# Patient Record
Sex: Female | Born: 1975
Health system: Southern US, Community
[De-identification: ages and names within clinical notes are randomized; demographics above are authoritative.]

## PROBLEM LIST (undated history)

## (undated) ENCOUNTER — Inpatient Hospital Stay (HOSPITAL_COMMUNITY): Payer: Self-pay

## (undated) DIAGNOSIS — N920 Excessive and frequent menstruation with regular cycle: Secondary | ICD-10-CM

## (undated) DIAGNOSIS — F419 Anxiety disorder, unspecified: Secondary | ICD-10-CM

## (undated) DIAGNOSIS — B9689 Other specified bacterial agents as the cause of diseases classified elsewhere: Secondary | ICD-10-CM

## (undated) DIAGNOSIS — R51 Headache: Secondary | ICD-10-CM

## (undated) DIAGNOSIS — F32A Depression, unspecified: Secondary | ICD-10-CM

## (undated) DIAGNOSIS — M549 Dorsalgia, unspecified: Secondary | ICD-10-CM

## (undated) DIAGNOSIS — O24419 Gestational diabetes mellitus in pregnancy, unspecified control: Secondary | ICD-10-CM

## (undated) DIAGNOSIS — R7303 Prediabetes: Secondary | ICD-10-CM

## (undated) DIAGNOSIS — I10 Essential (primary) hypertension: Secondary | ICD-10-CM

## (undated) DIAGNOSIS — M1990 Unspecified osteoarthritis, unspecified site: Secondary | ICD-10-CM

## (undated) DIAGNOSIS — E282 Polycystic ovarian syndrome: Secondary | ICD-10-CM

## (undated) DIAGNOSIS — D649 Anemia, unspecified: Secondary | ICD-10-CM

## (undated) DIAGNOSIS — L0292 Furuncle, unspecified: Secondary | ICD-10-CM

## (undated) DIAGNOSIS — R739 Hyperglycemia, unspecified: Secondary | ICD-10-CM

## (undated) DIAGNOSIS — J189 Pneumonia, unspecified organism: Secondary | ICD-10-CM

## (undated) DIAGNOSIS — T7840XA Allergy, unspecified, initial encounter: Secondary | ICD-10-CM

## (undated) DIAGNOSIS — J208 Acute bronchitis due to other specified organisms: Secondary | ICD-10-CM

## (undated) DIAGNOSIS — E781 Pure hyperglyceridemia: Secondary | ICD-10-CM

## (undated) DIAGNOSIS — F329 Major depressive disorder, single episode, unspecified: Secondary | ICD-10-CM

## (undated) DIAGNOSIS — Z8489 Family history of other specified conditions: Secondary | ICD-10-CM

## (undated) HISTORY — DX: Anemia, unspecified: D64.9

## (undated) HISTORY — DX: Dorsalgia, unspecified: M54.9

## (undated) HISTORY — DX: Prediabetes: R73.03

## (undated) HISTORY — DX: Pure hyperglyceridemia: E78.1

## (undated) HISTORY — DX: Allergy, unspecified, initial encounter: T78.40XA

## (undated) HISTORY — DX: Furuncle, unspecified: L02.92

## (undated) HISTORY — DX: Unspecified osteoarthritis, unspecified site: M19.90

## (undated) HISTORY — DX: Hyperglycemia, unspecified: R73.9

## (undated) HISTORY — DX: Polycystic ovarian syndrome: E28.2

---

## 1998-04-03 ENCOUNTER — Ambulatory Visit (HOSPITAL_COMMUNITY): Admission: RE | Admit: 1998-04-03 | Discharge: 1998-04-03 | Payer: Self-pay | Admitting: *Deleted

## 1998-07-23 ENCOUNTER — Encounter: Admission: RE | Admit: 1998-07-23 | Discharge: 1998-10-21 | Payer: Self-pay | Admitting: Gynecology

## 1998-08-28 ENCOUNTER — Inpatient Hospital Stay (HOSPITAL_COMMUNITY): Admission: AD | Admit: 1998-08-28 | Discharge: 1998-08-28 | Payer: Self-pay | Admitting: *Deleted

## 1998-08-29 ENCOUNTER — Inpatient Hospital Stay (HOSPITAL_COMMUNITY): Admission: AD | Admit: 1998-08-29 | Discharge: 1998-08-31 | Payer: Self-pay | Admitting: Gynecology

## 1998-10-11 ENCOUNTER — Other Ambulatory Visit: Admission: RE | Admit: 1998-10-11 | Discharge: 1998-10-11 | Payer: Self-pay | Admitting: Obstetrics and Gynecology

## 2000-08-06 ENCOUNTER — Other Ambulatory Visit: Admission: RE | Admit: 2000-08-06 | Discharge: 2000-08-06 | Payer: Self-pay | Admitting: *Deleted

## 2001-03-25 ENCOUNTER — Emergency Department (HOSPITAL_COMMUNITY): Admission: EM | Admit: 2001-03-25 | Discharge: 2001-03-25 | Payer: Self-pay | Admitting: Emergency Medicine

## 2002-08-31 HISTORY — PX: MOUTH SURGERY: SHX715

## 2005-03-07 ENCOUNTER — Emergency Department (HOSPITAL_COMMUNITY): Admission: EM | Admit: 2005-03-07 | Discharge: 2005-03-08 | Payer: Self-pay | Admitting: Emergency Medicine

## 2005-08-20 ENCOUNTER — Ambulatory Visit: Payer: Self-pay | Admitting: Family Medicine

## 2005-12-23 ENCOUNTER — Other Ambulatory Visit: Admission: RE | Admit: 2005-12-23 | Discharge: 2005-12-23 | Payer: Self-pay | Admitting: Gynecology

## 2006-08-31 DIAGNOSIS — Z8489 Family history of other specified conditions: Secondary | ICD-10-CM

## 2006-08-31 HISTORY — DX: Family history of other specified conditions: Z84.89

## 2007-04-24 ENCOUNTER — Inpatient Hospital Stay (HOSPITAL_COMMUNITY): Admission: AD | Admit: 2007-04-24 | Discharge: 2007-04-24 | Payer: Self-pay | Admitting: Obstetrics and Gynecology

## 2007-04-26 ENCOUNTER — Encounter (INDEPENDENT_AMBULATORY_CARE_PROVIDER_SITE_OTHER): Payer: Self-pay | Admitting: Obstetrics and Gynecology

## 2007-04-26 ENCOUNTER — Inpatient Hospital Stay (HOSPITAL_COMMUNITY): Admission: AD | Admit: 2007-04-26 | Discharge: 2007-04-29 | Payer: Self-pay | Admitting: Obstetrics and Gynecology

## 2007-05-05 ENCOUNTER — Inpatient Hospital Stay (HOSPITAL_COMMUNITY): Admission: AD | Admit: 2007-05-05 | Discharge: 2007-05-05 | Payer: Self-pay | Admitting: Obstetrics and Gynecology

## 2007-05-07 ENCOUNTER — Inpatient Hospital Stay (HOSPITAL_COMMUNITY): Admission: AD | Admit: 2007-05-07 | Discharge: 2007-05-07 | Payer: Self-pay | Admitting: Obstetrics and Gynecology

## 2010-11-24 LAB — ANTIBODY SCREEN: Antibody Screen: NEGATIVE

## 2010-11-24 LAB — ABO/RH: RH Type: POSITIVE

## 2010-11-24 LAB — RPR: RPR: NONREACTIVE

## 2011-01-13 NOTE — Discharge Summary (Signed)
NAMECORALINE, TALWAR                 ACCOUNT NO.:  [id number]   MEDICAL RECORD NO.:  [id number]          PATIENT TYPE:  INP   LOCATION:  9104                          [facility]  [facility]   PHYSICIAN:  Osborn Coho, M.D.   DATE OF BIRTH:  1976-08-05   DATE OF ADMISSION:  04/26/2007  DATE OF DISCHARGE:  04/29/2007                               DISCHARGE SUMMARY   DISCHARGE PHYSICIAN:  Dois Davenport A. Rivard, M.D.   ADMISSION DIAGNOSES:  1. Intrauterine pregnancy at 40-2/7th's weeks.  2. Spontaneous rupture of membranes.  3. Early labor.   DISCHARGE DIAGNOSES:  1. Intrauterine pregnancy at 40-2/7th's weeks.  2. Spontaneous rupture of membranes.  3. Early labor.  4. Variable decelerations in labor and prolonged active stage of      labor.  5. Status post low-transverse cesarean section of a viable female      infant weighing 7 pounds 10 ounces, Apgar's 9 and 9.  6. Placental abruption.  7. Cord around the foot.   HOSPITAL PROCEDURES:  1. Electronic fetal monitoring.  2. Epidural anesthesia.  3. Internal fetal monitoring.  4. STAT low-transverse cesarean section.  5. General anesthesia.   HOSPITAL COURSE:  The patient was admitted in early labor with ruptured  membranes.  Cervix was 3-cm dilated with clear fluid leaking.  She  progressed slowly in labor and internal monitors were placed.  She  developed variable decelerations and late decelerations and then  developed a period of prolonged decelerations and uterine rupture was  suspected as the patient began to have vaginal bleeding along with the  decelerations and she was taken to the operating room for a STAT C-  section and was delivered of a female infant weighing 7 pounds 10 ounces  Apgar's 9 and 9.  We found a placental abruption and a cord around the  foot. The patient was taken to the recovery room to recover from general  anesthesia and then to the mother/baby unit where she did well.  On post-op day #1, Foley was removed.   She was tolerating food and  fluids.  She was breast feeding and given routine post-op care.  On post-op day #2, she was doing well but feeling shaky.  Blood sugar 30  minutes after eating was 176 and 2 hours after eating was 136.  Hemoglobin was 9.3 and chemistries were normal.  On post-op day #3, she was feeling much better.  Up and around and  breast feeding without problems.  Vital signs were stable.  Chest was  clear.  Heart rate regular rate and rhythm.  Abdomen soft and  appropriately tender.  Incision was clean, dry, and intact.  JP was  draining scant fluid and was removed.  Lochia was within normal limits.  Extremities within normal limits.  She was deemed to receive full  benefit of her hospital stay, was discharged home.   DISCHARGE LABORATORY:  White blood cell count 15.1, hemoglobin 9.3,  platelets 319.  Chemistries normal.   DISCHARGE MEDICATIONS:  1. Motrin 600 mg p.o. q.6 h. p.r.n.  2. Tylox 1-2 p.o. q.4 h.  p.r.n.   DISCHARGE INSTRUCTIONS:  Per [facility] handout.   DISCHARGE FOLLOWUP:  In 6 weeks or p.r.n.      Marie L. Williams, C.N.M.      Osborn Coho, M.D.  Electronically Signed    MLW/MEDQ  D:  04/29/2007  T:  04/29/2007  Job:  [id number]
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
## 2011-01-13 NOTE — Op Note (Signed)
Teresa Perez, KRAUSZ                 ACCOUNT NO.:  [id number]   MEDICAL RECORD NO.:  [id number]          PATIENT TYPE:  INP   LOCATION:  9104                          [facility]  [facility]   PHYSICIAN:  Crist Fat. Rivard, M.D. DATE OF BIRTH:  Jul 16, 1976   DATE OF PROCEDURE:  04/26/2007  DATE OF DISCHARGE:                               OPERATIVE REPORT   PREOPERATIVE DIAGNOSIS:  Intrauterine pregnancy at 40 weeks with  previous cesarean section, nonreassuring fetal heart rate, possible  uterine rupture.   POSTOPERATIVE DIAGNOSIS:  Intrauterine pregnancy at 40 weeks, previous  cesarean section, nonreassuring fetal heart rate, placental abruptio.   ANESTHESIA:  General, Dr. Tacy Dura.   PROCEDURE:  Repeat low transverse cesarean section.   SURGEON:  Crist Fat. Rivard, M.D.   ASSISTANT:  Wynelle Bourgeois, certified nurse midwife.   ESTIMATED BLOOD LOSS:  700 mL.   PROCEDURE:  The patient was rapidly consented for a repeat low  transverse cesarean section in light of nonreassuring fetal heart rate  and taken to OR number one.  Previous epidural anesthesia was  insufficient.  Decision was made to proceed with general anesthesia and  endotracheal intubation which was performed without complication after  the patient had been prepped and draped in a sterile fashion.  A Foley  catheter already being in her bladder.   We then proceed with a Pfannenstiel incision overlooking the previous  Pfannenstiel incision and this was brought down sharply to the fascia.  The fascia is incised in a low transverse fashion.  Linea alba is  dissected.  Peritoneum is entered bluntly.  Visceral peritoneum is  entered in a low transverse fashion allowing [place] to safely retract  bladder by developing a bladder flap.  The myometrium is then entered in  a low transverse fashion first with knife then extended bluntly.  Amniotic fluid was port wine tinted and clots were evacuated.  We then  assist the birth of a female infant  in vertex presentation at 12:29.  Mouth and nose were suctioned.  Body is delivered.  Cord is clamped with  two Kelly clamps and the baby is given to the pediatrician present in  the room.   The placenta is allowed to deliver spontaneously.  It is complete cord,  has three vessels and the placenta is sent for cord blood donation, then  to pathology for placental abruptio.  Uterine revision is negative.  Ancef 2 grams IV is given to the patient and we then proceed with  closure of the myometrium in two layers, first with a running lock  suture of 0 Vicryl, then with a Lembert suture of 0 Vicryl imbricating  the first one.  Hemostasis was completed on peritoneal edges with  cauterization.  Both paracolic gutters are cleaned.  Both tubes and  ovaries assessed and normal.  The pelvis is then profusely irrigated  with warm saline and hemostasis is deemed adequate.   Under fascia hemostasis is completed with cautery as well as with three  figure-of-eight stitches of 0 Vicryl on the right rectus muscle at the  junction with the fascia  where bleeding was evident.  After the sutures  hemostasis is adequate.   The fascia is closed with two running sutures of 1 Vicryl meeting  midline.  Wound is irrigated with warm saline.  Hemostasis is completed  with cautery with a left counter incision.  A #10 Jackson-Pratt drain is  inserted in the incision and held in placed with the 0 silk suture.  Skin is closed with a subcuticular suture of 3-0 Monocryl and Steri-  Strips.   Instrument and sponge count is complete x2.  Estimated blood loss is 700  mL.  The procedure is well tolerated by the patient who is taken to  recovery room in a well and stable condition.   Little girl was born at 12:29, received an Apgar of 9 at one minute, 9  at five minutes and weighs 7 pounds 10 ounces.   SPECIMEN:  Placenta is sent to pathology for placental abruptio.      Crist Fat Rivard, M.D.  Electronically  Signed     SAR/MEDQ  D:  04/26/2007  T:  04/27/2007  Job:  [id number]

## 2011-01-16 NOTE — Group Therapy Note (Signed)
Teresa Perez, Teresa Perez NO.:  [id number]   MEDICAL RECORD NO.:  [id number]          PATIENT TYPE:  WOC   LOCATION:  [facility]                   FACILITY:  [facility]   PHYSICIAN:  Tinnie Gens, MD        DATE OF BIRTH:  10/14/1975   DATE OF SERVICE:                                    CLINIC NOTE   CHIEF COMPLAINT:  Follow-up PCOS.   HISTORY OF PRESENT ILLNESS:  Patient is a 35 year old gravida 2, para 2, who  has been diagnosed with polycystic ovaries previously.  The patient has a  long history of irregular cycles and after the birth of her last child she  was on Depo, and then on the pill, and she was still having irregular  cycles, she feels.  However, she has been having unprotected intercourse and  has failed to get pregnant for the last six years, which was concerning for  her, so she has been off the pill for the last year and she has had regular  cycles every single month with no difficulty.  The patient [facility], and is on appropriate diet  currently for elevated cholesterol.  The patient did not have a test for  diabetes at that time.  The patient has recently given up sodas and has been  on several different diet, but has not had meaningful weight loss as yet.   PAST MEDICAL HISTORY:  Significant for a history of asthma, and heavy  cycles.   SURGICAL HISTORY:  Negative.   ALLERGIES:  None known.   MEDICATIONS:  1.  Vitamin E two p.o. daily.  2.  Vitamin B12 one p.o. daily.   OBSTETRIC HISTORY:  G2, P2, one C-section.   GYNECOLOGIC HISTORY:  Menarche at age 83, cycles q. 30 days, last seven  days, heavy flow, mild pain.  Condoms.  She has a history of abnormal Pap.  She had undergone colposcopy in 2004.   FAMILY HISTORY:  Diabetes and hypertension.   SOCIAL HISTORY:  She does smoke a half pack per day.  No alcohol.   REVIEW OF SYMPTOMS:  Fourteen point review of systems reviewed.  Please see  GYN  history in the chart.  Positive for fatigue, weight gain, weight loss,  frequent headache, nausea, vomiting, and vaginal odor.  She reports that the  odor has been there for a while, but she does not have any significant  vaginal discharge.  It does not seem like a yeast infection, which she does  have frequently.   PHYSICAL EXAMINATION:  VITAL SIGNS:  Blood pressure is 126/84, pulse 66,  weight 235.5.  GENERAL: She is an obese female in no acute distress.  ABDOMEN:  Soft, nontender, nondistended.  GENITOURINARY:  Normal external female genitalia.  The vagina is pink and  rugated.  There is no white discharge noted.  The uterus and adnexa could  not be adequately evaluated because of patient's body habitus.  The wet prep  is obtained.   IMPRESSION:  1.  Obesity.  2.  History of  polycystic ovarian syndrome.  Given that she is having normal      cycles, I cannot really say that she has that diagnosis; she is      certainly at risk for it.  She has hyperlipidemia, and she has not had a      good evaluation for diabetes.   PLAN:  1.  A lengthy amount of time was spent with this patient talking weight      loss, exercise program including aerobic and muscle building program.      The patient was also counseled regarding elevated lipids and what she      could do about that and having a low-carbohydrate diet.  The patient      also wanted to know how come she had not been pregnant; we did not      really go into this at this time.  2.  [facility]  She should have a      test for diabetes every year to every two to three years.           ______________________________  Tinnie Gens, MD     TP/MEDQ  D:  08/20/2005  T:  08/24/2005  Job:  [id number]

## 2011-01-30 ENCOUNTER — Inpatient Hospital Stay (HOSPITAL_COMMUNITY): Admission: AD | Admit: 2011-01-30 | Payer: Self-pay | Source: Ambulatory Visit | Admitting: Obstetrics and Gynecology

## 2011-05-19 ENCOUNTER — Other Ambulatory Visit: Payer: Self-pay | Admitting: Obstetrics and Gynecology

## 2011-06-12 LAB — COMPREHENSIVE METABOLIC PANEL
AST: 27
AST: 26
BUN: 7
CO2: 26
CO2: 24
Calcium: 8.5
Chloride: 108
Chloride: 107
Creatinine, Ser: 0.8
Creatinine, Ser: 0.64
GFR calc Af Amer: >60
GFR calc non Af Amer: >60
GFR calc non Af Amer: >60
Glucose, Bld: 118 — ABNORMAL HIGH
Total Bilirubin: 0.4
Total Bilirubin: 0.2 — ABNORMAL LOW

## 2011-06-12 LAB — CBC
HCT: 30.7 — ABNORMAL LOW
HCT: 27.6 — ABNORMAL LOW
HCT: 29.2 — ABNORMAL LOW
Hemoglobin: 9.3 — ABNORMAL LOW
Hemoglobin: 9.8 — ABNORMAL LOW
MCHC: 33.5
MCV: 79
MCV: 79.2
MCV: 79.4
Platelets: 348
RBC: 3.89
RBC: 3.48 — ABNORMAL LOW
RBC: 3.73 — ABNORMAL LOW
RBC: 3.97
RDW: 15 — ABNORMAL HIGH
WBC: 12.2 — ABNORMAL HIGH
WBC: 15.1 — ABNORMAL HIGH
WBC: 18.6 — ABNORMAL HIGH
WBC: 19.1 — ABNORMAL HIGH

## 2011-06-12 LAB — URINALYSIS, ROUTINE W REFLEX MICROSCOPIC
Bilirubin Urine: NEGATIVE
Ketones, ur: NEGATIVE
Nitrite: NEGATIVE
Urobilinogen, UA: 0.2
pH: 6

## 2011-06-12 LAB — CREATININE CLEARANCE, URINE, 24 HOUR
Creatinine, 24H Ur: 1110
Creatinine, Urine: 74
Creatinine: 0.8
Urine Total Volume-CRCL: 1500

## 2011-06-12 LAB — WET PREP, GENITAL
Trich, Wet Prep: NONE SEEN
Yeast Wet Prep HPF POC: NONE SEEN

## 2011-06-12 LAB — URIC ACID: Uric Acid, Serum: 7.6 — ABNORMAL HIGH

## 2011-06-12 LAB — LACTATE DEHYDROGENASE: LDH: 175

## 2011-06-12 LAB — RPR: RPR Ser Ql: NONREACTIVE

## 2011-06-12 LAB — PROTEIN, URINE, 24 HOUR: Collection Interval-UPROT: 24

## 2011-06-12 LAB — URINE MICROSCOPIC-ADD ON

## 2011-06-25 ENCOUNTER — Encounter (HOSPITAL_COMMUNITY): Payer: Self-pay

## 2011-06-25 NOTE — Patient Instructions (Addendum)
   Your procedure is scheduled on: Friday November 2nd  Enter through the Main Entrance of [facility] at: 7:30 am Pick up the phone at the desk and dial [phone] and inform us of your arrival.  Please call this number if you have any problems the morning of surgery: [phone]  Remember: Do not eat food after midnight: Thursday Do not drink clear liquids after: midnight Thursday Take these medicines the morning of surgery with a SIP OF WATER:none  Do not wear jewelry, make-up, or FINGER nail polish Do not wear lotions, powders, or perfumes.  You may not wear deodorant. Do not shave 48 hours prior to surgery. Do not bring valuables to the hospital.  Leave suitcase in the car. After Surgery it may be brought to your room. For patients being admitted to the hospital, checkout time is 11:00am the day of discharge.   Remember to use your hibiclens as instructed.Please shower with 1/2 bottle the evening before your surgery and the other 1/2 bottle the morning of surgery.

## 2011-06-29 ENCOUNTER — Inpatient Hospital Stay (HOSPITAL_COMMUNITY)
Admission: AD | Admit: 2011-06-29 | Payer: BC Managed Care – PPO | Source: Ambulatory Visit | Admitting: Obstetrics and Gynecology

## 2011-06-29 ENCOUNTER — Encounter (HOSPITAL_COMMUNITY): Payer: Self-pay | Admitting: *Deleted

## 2011-06-29 ENCOUNTER — Inpatient Hospital Stay (HOSPITAL_COMMUNITY)
Admission: AD | Admit: 2011-06-29 | Discharge: 2011-06-29 | Disposition: A | Discharge status: Home / Self Care | Payer: BC Managed Care – PPO | Source: Ambulatory Visit | Attending: Obstetrics and Gynecology | Admitting: Obstetrics and Gynecology

## 2011-06-29 ENCOUNTER — Encounter (HOSPITAL_COMMUNITY): Payer: Self-pay

## 2011-06-29 ENCOUNTER — Encounter (HOSPITAL_COMMUNITY)
Admission: RE | Admit: 2011-06-29 | Discharge: 2011-06-29 | Disposition: A | Discharge status: Home / Self Care | Payer: BC Managed Care – PPO | Source: Ambulatory Visit | Attending: Obstetrics and Gynecology | Admitting: Obstetrics and Gynecology

## 2011-06-29 DIAGNOSIS — O2390 Unspecified genitourinary tract infection in pregnancy, unspecified trimester: Secondary | ICD-10-CM | POA: Insufficient documentation

## 2011-06-29 DIAGNOSIS — N760 Acute vaginitis: Secondary | ICD-10-CM | POA: Insufficient documentation

## 2011-06-29 DIAGNOSIS — O368190 Decreased fetal movements, unspecified trimester, not applicable or unspecified: Secondary | ICD-10-CM | POA: Insufficient documentation

## 2011-06-29 DIAGNOSIS — O4690 Antepartum hemorrhage, unspecified, unspecified trimester: Secondary | ICD-10-CM | POA: Insufficient documentation

## 2011-06-29 HISTORY — DX: Essential (primary) hypertension: I10

## 2011-06-29 HISTORY — DX: Anxiety disorder, unspecified: F41.9

## 2011-06-29 HISTORY — DX: Major depressive disorder, single episode, unspecified: F32.9

## 2011-06-29 HISTORY — DX: Depression, unspecified: F32.A

## 2011-06-29 LAB — WET PREP, GENITAL
Clue Cells Wet Prep HPF POC: NONE SEEN
Trich, Wet Prep: NONE SEEN

## 2011-06-29 LAB — CBC
Hemoglobin: 10.7 g/dL — ABNORMAL LOW (ref 12.0–15.0)
MCV: 78.8 fL (ref 78.0–100.0)
Platelets: 328 10*3/uL (ref 150–400)
RBC: 4.25 MIL/uL (ref 3.87–5.11)
WBC: 15.6 10*3/uL — ABNORMAL HIGH (ref 4.0–10.5)

## 2011-06-29 LAB — RPR: RPR Ser Ql: NONREACTIVE

## 2011-06-29 LAB — SURGICAL PCR SCREEN: MRSA, PCR: NEGATIVE

## 2011-06-29 MED ORDER — FLORAJEN3 PO CAPS: 1.0000 | ORAL_CAPSULE | Freq: Every day | ORAL | Status: DC

## 2011-06-29 MED ORDER — TERCONAZOLE 0.8 % VA CREA: 1.0000 | TOPICAL_CREAM | Freq: Every day | VAGINAL | Status: AC

## 2011-06-29 NOTE — ED Provider Notes (Signed)
History   Teresa Perez is a 35y.o. hispanic female at 38.2 weeks per [facility] 07/10/11 who presents w/ CC bleeding when wiping, decreased fetal movement, and vulvar & vaginal itching, irritation.  Feels may have wiped "too hard," b/c scratching, and may have caused the bleeding.  Pt called to report these symptoms to office and was very scared and tearful, so brought here.  She has a scheduled repeat c/s for this Friday 07/03/11.  Reports adequate hydration and po intake today.  Rare ctx.  No LOF.  Pregnancy r/f 1.  AMA  2.  CHTN  3.  H/o GDM w/ 2nd pregnancy  4.  H/o stds  5.  H/o depression/anxiety  6.  Obese  7.  Smoking hx  8.  H/o abnl pap  9.  Previous c/s x2--repeat this Friday scheduled  10.  H/o abruption last delivery  11.  H/o childhood asthma. Denies UTI or PIH s/s.  No scheduled office appts this week per pt, secondary to upcoming planned c/s.  Reports cold about a week ago, but not taking any OTC meds currently; only med=PNV.    Chief Complaint  Patient presents with  . Vaginal Bleeding   HPI  OB History    Grav Para Term Preterm Abortions TAB SAB Ect Mult Living   4 3 3  0 0 0 0 0 0 3      Past Medical History  Diagnosis Date  . Hypertension     pt states she was treated with hctz for pre-htn for approx 60mos-has not taken since 07/2010  . Asthma     childhood asthma  . Anxiety   . Depression     has been treated in past-no present meds    Past Surgical History  Procedure Date  . Cesarean section     x2 previous  . Mouth surgery 2004    Family History  Problem Relation Age of Onset  . Anesthesia problems Son     son-had T&A-2008-had trismus    History  Substance Use Topics  . Smoking status: Current Some Day Smoker -- 0.2 packs/day for 16 years    Types: Cigarettes  . Smokeless tobacco: Never Used  . Alcohol Use: No    Allergies:  Allergies  Allergen Reactions  . Latex Rash    Prescriptions prior to admission  Medication Sig Dispense Refill  . guaifenesin  (ROBITUSSIN) 100 MG/5ML syrup Take 200 mg by mouth 3 (three) times daily as needed. Cold/cough       . pseudoephedrine (SUDAFED) 30 MG tablet Take 30 mg by mouth every 4 (four) hours as needed. Cold/congestion         ROS--see HPI Physical Exam   Blood pressure 134/80, pulse 81, temperature 97.7 F (36.5 C), temperature source Oral, resp. rate 18, height 5\' 6"  (1.676 m), weight 121.11 kg (267 lb), last menstrual period 09/25/2010. .. Results for orders placed during the hospital encounter of 06/29/11 (from the past 24 hour(s))  WET PREP, GENITAL     Status: Abnormal   Collection Time   06/29/11  2:55 PM      Component Value Range   Yeast, Wet Prep NONE SEEN  NONE SEEN    Trich, Wet Prep NONE SEEN  NONE SEEN    Clue Cells, Wet Prep NONE SEEN  NONE SEEN    WBC, Wet Prep HPF POC MODERATE (*) NONE SEEN    Physical Exam  Constitutional: She is oriented to person, place, and time. She appears well-developed  and well-nourished. No distress.       obese  Cardiovascular: Normal rate and regular rhythm.   Respiratory: Effort normal and breath sounds normal.  GI: Soft. Bowel sounds are normal.  Genitourinary:       Rt vulva w/ 2-2.5 cm area of redness; Introitus w/ erythema and as enter vagina; vault w/ some thin white d/c, but also very small clumps noted. NO BLOOD AT INTROITUS OR IN VAULT. Cx:  Cl/long/soft/-2  Neurological: She is alert and oriented to person, place, and time.  Skin: Skin is warm and dry.  Psychiatric: She has a normal mood and affect. Her behavior is normal. Judgment and thought content normal.    MAU Course  Procedures 1.  Wet prep 2.  SSE 3.  NST--baseline 135, reactive, no decels, moderate variability; TOCO: rare ctx  Assessment and Plan  1.  IUP at 38.2 weeks 2.  Vulvovaginitis 3.  Suspect blood from wiping on irritated skin  4.  Decreased FM, but reactive NST 5.  No s/s of labor or abruption  1.  D/c home w/ [facility], labor s/s 2. Rx's given for Terazol 3  and Florajen 3 Probiotic 3.  F/u this Friday at [facility] as scheduled or prn  Jandy Brackens H 06/29/2011, 3:33 PM

## 2011-06-29 NOTE — Progress Notes (Signed)
Pt in c/o sudden onset of vaginal itching and burning around 1300, states she went to the bathroom after and noticed some spotting with wiping.  Reports decreased fetal movement today.  Denies any contractions or abnormal discharge.

## 2011-07-02 MED ORDER — CEFAZOLIN SODIUM-DEXTROSE 2-3 GM-% IV SOLR
2.0000 g | INTRAVENOUS | Status: DC
  Filled 2011-07-02: qty 50

## 2011-07-03 ENCOUNTER — Inpatient Hospital Stay (HOSPITAL_COMMUNITY)
Admission: RE | Admit: 2011-07-03 | Discharge: 2011-07-06 | DRG: 370 | Disposition: A | Discharge status: Home / Self Care | Payer: BC Managed Care – PPO | Source: Ambulatory Visit | Attending: Obstetrics and Gynecology | Admitting: Obstetrics and Gynecology

## 2011-07-03 ENCOUNTER — Encounter (HOSPITAL_COMMUNITY): Payer: Self-pay | Admitting: Anesthesiology

## 2011-07-03 ENCOUNTER — Inpatient Hospital Stay (HOSPITAL_COMMUNITY): Payer: BC Managed Care – PPO | Admitting: Anesthesiology

## 2011-07-03 ENCOUNTER — Encounter (HOSPITAL_COMMUNITY)
Admission: RE | Disposition: A | Discharge status: Home / Self Care | Payer: Self-pay | Source: Ambulatory Visit | Attending: Obstetrics and Gynecology

## 2011-07-03 ENCOUNTER — Encounter (HOSPITAL_COMMUNITY): Payer: Self-pay | Admitting: *Deleted

## 2011-07-03 DIAGNOSIS — Z98891 History of uterine scar from previous surgery: Secondary | ICD-10-CM

## 2011-07-03 DIAGNOSIS — Z01818 Encounter for other preprocedural examination: Secondary | ICD-10-CM

## 2011-07-03 DIAGNOSIS — O9903 Anemia complicating the puerperium: Secondary | ICD-10-CM | POA: Diagnosis not present

## 2011-07-03 DIAGNOSIS — O1002 Pre-existing essential hypertension complicating childbirth: Secondary | ICD-10-CM | POA: Diagnosis present

## 2011-07-03 DIAGNOSIS — O34219 Maternal care for unspecified type scar from previous cesarean delivery: Principal | ICD-10-CM | POA: Diagnosis present

## 2011-07-03 DIAGNOSIS — O09529 Supervision of elderly multigravida, unspecified trimester: Secondary | ICD-10-CM | POA: Diagnosis present

## 2011-07-03 DIAGNOSIS — Z01812 Encounter for preprocedural laboratory examination: Secondary | ICD-10-CM

## 2011-07-03 DIAGNOSIS — D649 Anemia, unspecified: Secondary | ICD-10-CM | POA: Diagnosis not present

## 2011-07-03 LAB — GLUCOSE, CAPILLARY: Glucose-Capillary: 121 mg/dL — ABNORMAL HIGH (ref 70–99)

## 2011-07-03 LAB — TYPE AND SCREEN

## 2011-07-03 LAB — ABO/RH: ABO/RH(D): AB POS

## 2011-07-03 SURGERY — Surgical Case
Anesthesia: Regional | Site: Uterus | Wound class: Clean Contaminated

## 2011-07-03 MED ORDER — MORPHINE SULFATE 0.5 MG/ML IJ SOLN
INTRAMUSCULAR | Status: AC
  Filled 2011-07-03: qty 10

## 2011-07-03 MED ORDER — LANOLIN HYDROUS EX OINT: 1.0000 "application " | TOPICAL_OINTMENT | CUTANEOUS | Status: DC | PRN

## 2011-07-03 MED ORDER — OXYTOCIN 20 UNITS IN LACTATED RINGERS INFUSION - SIMPLE
INTRAVENOUS | Status: DC | PRN
  Administered 2011-07-03: 20 [IU] via INTRAVENOUS

## 2011-07-03 MED ORDER — EPHEDRINE SULFATE 50 MG/ML IJ SOLN
INTRAMUSCULAR | Status: DC | PRN
  Administered 2011-07-03 (×3): 20 mg via INTRAVENOUS

## 2011-07-03 MED ORDER — METHYLERGONOVINE MALEATE 0.2 MG/ML IJ SOLN: 0.2000 mg | INTRAMUSCULAR | Status: DC | PRN

## 2011-07-03 MED ORDER — SIMETHICONE 80 MG PO CHEW: 80.0000 mg | CHEWABLE_TABLET | ORAL | Status: DC | PRN

## 2011-07-03 MED ORDER — ONDANSETRON HCL 4 MG/2ML IJ SOLN
INTRAMUSCULAR | Status: AC
  Filled 2011-07-03: qty 2

## 2011-07-03 MED ORDER — DIPHENHYDRAMINE HCL 25 MG PO CAPS
25.0000 mg | ORAL_CAPSULE | ORAL | Status: DC | PRN
  Filled 2011-07-03: qty 1

## 2011-07-03 MED ORDER — KETOROLAC TROMETHAMINE 30 MG/ML IJ SOLN
INTRAMUSCULAR | Status: AC
  Administered 2011-07-03: 30 mg via INTRAVENOUS
  Filled 2011-07-03: qty 1

## 2011-07-03 MED ORDER — NALOXONE HCL 0.4 MG/ML IJ SOLN: 0.4000 mg | INTRAMUSCULAR | Status: DC | PRN

## 2011-07-03 MED ORDER — BUPIVACAINE IN DEXTROSE 0.75-8.25 % IT SOLN
INTRATHECAL | Status: DC | PRN
  Administered 2011-07-03: 1.6 mL via INTRATHECAL

## 2011-07-03 MED ORDER — KETOROLAC TROMETHAMINE 30 MG/ML IJ SOLN: 30.0000 mg | Freq: Four times a day (QID) | INTRAMUSCULAR | Status: AC | PRN

## 2011-07-03 MED ORDER — DIBUCAINE 1 % RE OINT
1.0000 "application " | TOPICAL_OINTMENT | RECTAL | Status: DC | PRN
  Filled 2011-07-03: qty 28

## 2011-07-03 MED ORDER — ONDANSETRON HCL 4 MG PO TABS: 4.0000 mg | ORAL_TABLET | ORAL | Status: DC | PRN

## 2011-07-03 MED ORDER — BUPIVACAINE HCL (PF) 0.25 % IJ SOLN
INTRAMUSCULAR | Status: DC | PRN
  Administered 2011-07-03: 20 mL

## 2011-07-03 MED ORDER — OXYCODONE-ACETAMINOPHEN 5-325 MG PO TABS
1.0000 | ORAL_TABLET | ORAL | Status: DC | PRN
  Administered 2011-07-04 – 2011-07-06 (×8): 1 via ORAL
  Filled 2011-07-03 (×8): qty 1

## 2011-07-03 MED ORDER — EPHEDRINE 5 MG/ML INJ
INTRAVENOUS | Status: AC
  Filled 2011-07-03: qty 10

## 2011-07-03 MED ORDER — MORPHINE SULFATE (PF) 0.5 MG/ML IJ SOLN
INTRAMUSCULAR | Status: DC | PRN
  Administered 2011-07-03: 150 ug via INTRATHECAL

## 2011-07-03 MED ORDER — NALBUPHINE SYRINGE 5 MG/0.5 ML
5.0000 mg | INJECTION | INTRAMUSCULAR | Status: DC | PRN
  Filled 2011-07-03: qty 1

## 2011-07-03 MED ORDER — FENTANYL CITRATE 0.05 MG/ML IJ SOLN: 25.0000 ug | INTRAMUSCULAR | Status: DC | PRN

## 2011-07-03 MED ORDER — METOCLOPRAMIDE HCL 5 MG/ML IJ SOLN
10.0000 mg | Freq: Three times a day (TID) | INTRAMUSCULAR | Status: DC | PRN
  Administered 2011-07-03: 10 mg via INTRAVENOUS
  Filled 2011-07-03: qty 2

## 2011-07-03 MED ORDER — FENTANYL CITRATE 0.05 MG/ML IJ SOLN
INTRAMUSCULAR | Status: AC
  Filled 2011-07-03: qty 2

## 2011-07-03 MED ORDER — METOCLOPRAMIDE HCL 5 MG/ML IJ SOLN: 10.0000 mg | Freq: Once | INTRAMUSCULAR | Status: DC | PRN

## 2011-07-03 MED ORDER — SODIUM CHLORIDE 0.9 % IJ SOLN: 3.0000 mL | INTRAMUSCULAR | Status: DC | PRN

## 2011-07-03 MED ORDER — WITCH HAZEL-GLYCERIN EX PADS: 1.0000 "application " | MEDICATED_PAD | CUTANEOUS | Status: DC | PRN

## 2011-07-03 MED ORDER — SCOPOLAMINE 1 MG/3DAYS TD PT72
1.0000 | MEDICATED_PATCH | Freq: Once | TRANSDERMAL | Status: DC
  Administered 2011-07-03: 1.5 mg via TRANSDERMAL

## 2011-07-03 MED ORDER — DIPHENHYDRAMINE HCL 25 MG PO CAPS
25.0000 mg | ORAL_CAPSULE | Freq: Four times a day (QID) | ORAL | Status: DC | PRN
Start: 2011-07-03 — End: 2011-07-06
  Filled 2011-07-03: qty 1

## 2011-07-03 MED ORDER — FENTANYL CITRATE 0.05 MG/ML IJ SOLN
INTRAMUSCULAR | Status: DC | PRN
  Administered 2011-07-03: 25 ug via INTRATHECAL

## 2011-07-03 MED ORDER — DIPHENHYDRAMINE HCL 50 MG/ML IJ SOLN: 25.0000 mg | INTRAMUSCULAR | Status: DC | PRN

## 2011-07-03 MED ORDER — ONDANSETRON HCL 4 MG/2ML IJ SOLN: 4.0000 mg | Freq: Three times a day (TID) | INTRAMUSCULAR | Status: DC | PRN

## 2011-07-03 MED ORDER — IBUPROFEN 600 MG PO TABS
600.0000 mg | ORAL_TABLET | Freq: Four times a day (QID) | ORAL | Status: DC
  Administered 2011-07-04 – 2011-07-06 (×9): 600 mg via ORAL
  Filled 2011-07-03 (×6): qty 1

## 2011-07-03 MED ORDER — DIPHENHYDRAMINE HCL 50 MG/ML IJ SOLN: 12.5000 mg | INTRAMUSCULAR | Status: DC | PRN

## 2011-07-03 MED ORDER — SENNOSIDES-DOCUSATE SODIUM 8.6-50 MG PO TABS
2.0000 | ORAL_TABLET | Freq: Every day | ORAL | Status: DC
  Administered 2011-07-03 – 2011-07-05 (×3): 2 via ORAL

## 2011-07-03 MED ORDER — FERROUS SULFATE 325 (65 FE) MG PO TABS
325.0000 mg | ORAL_TABLET | Freq: Two times a day (BID) | ORAL | Status: DC
  Administered 2011-07-04 – 2011-07-06 (×5): 325 mg via ORAL
  Filled 2011-07-03 (×7): qty 1

## 2011-07-03 MED ORDER — OXYTOCIN 10 UNIT/ML IJ SOLN
INTRAMUSCULAR | Status: AC
  Filled 2011-07-03: qty 2

## 2011-07-03 MED ORDER — MENTHOL 3 MG MT LOZG
1.0000 | LOZENGE | OROMUCOSAL | Status: DC | PRN
  Filled 2011-07-03: qty 9

## 2011-07-03 MED ORDER — CEFAZOLIN SODIUM 1-5 GM-% IV SOLN
INTRAVENOUS | Status: DC | PRN
  Administered 2011-07-03: 2 g via INTRAVENOUS

## 2011-07-03 MED ORDER — TETANUS-DIPHTH-ACELL PERTUSSIS 5-2.5-18.5 LF-MCG/0.5 IM SUSP
0.5000 mL | Freq: Once | INTRAMUSCULAR | Status: AC
  Administered 2011-07-04: 0.5 mL via INTRAMUSCULAR
  Filled 2011-07-03 (×2): qty 0.5

## 2011-07-03 MED ORDER — ONDANSETRON HCL 4 MG/2ML IJ SOLN
INTRAMUSCULAR | Status: DC | PRN
  Administered 2011-07-03: 4 mg via INTRAVENOUS

## 2011-07-03 MED ORDER — PNEUMOCOCCAL VAC POLYVALENT 25 MCG/0.5ML IJ INJ
0.5000 mL | INJECTION | INTRAMUSCULAR | Status: AC
  Filled 2011-07-03: qty 0.5

## 2011-07-03 MED ORDER — OXYTOCIN 20 UNITS IN LACTATED RINGERS INFUSION - SIMPLE: 125.0000 mL/h | INTRAVENOUS | Status: AC

## 2011-07-03 MED ORDER — ZOLPIDEM TARTRATE 5 MG PO TABS: 5.0000 mg | ORAL_TABLET | Freq: Every evening | ORAL | Status: DC | PRN

## 2011-07-03 MED ORDER — MEPERIDINE HCL 25 MG/ML IJ SOLN: 6.2500 mg | INTRAMUSCULAR | Status: DC | PRN

## 2011-07-03 MED ORDER — PRENATAL PLUS 27-1 MG PO TABS
1.0000 | ORAL_TABLET | Freq: Every day | ORAL | Status: DC
  Administered 2011-07-04 – 2011-07-06 (×3): 1 via ORAL
  Filled 2011-07-03 (×5): qty 1

## 2011-07-03 MED ORDER — LACTATED RINGERS IV SOLN
INTRAVENOUS | Status: DC
  Administered 2011-07-03 (×5): via INTRAVENOUS

## 2011-07-03 MED ORDER — SODIUM CHLORIDE 0.9 % IV SOLN
1.0000 ug/kg/h | INTRAVENOUS | Status: DC | PRN
  Filled 2011-07-03: qty 2.5

## 2011-07-03 MED ORDER — SCOPOLAMINE 1 MG/3DAYS TD PT72: 1.0000 | MEDICATED_PATCH | Freq: Once | TRANSDERMAL | Status: DC

## 2011-07-03 MED ORDER — KETOROLAC TROMETHAMINE 30 MG/ML IJ SOLN
30.0000 mg | Freq: Four times a day (QID) | INTRAMUSCULAR | Status: AC | PRN
  Administered 2011-07-03: 30 mg via INTRAVENOUS

## 2011-07-03 MED ORDER — IBUPROFEN 600 MG PO TABS
600.0000 mg | ORAL_TABLET | Freq: Four times a day (QID) | ORAL | Status: DC | PRN
  Administered 2011-07-04 (×2): 600 mg via ORAL
  Filled 2011-07-03 (×5): qty 1

## 2011-07-03 MED ORDER — ONDANSETRON HCL 4 MG/2ML IJ SOLN: 4.0000 mg | INTRAMUSCULAR | Status: DC | PRN

## 2011-07-03 MED ORDER — METHYLERGONOVINE MALEATE 0.2 MG PO TABS: 0.2000 mg | ORAL_TABLET | ORAL | Status: DC | PRN

## 2011-07-03 MED ORDER — LACTATED RINGERS IV SOLN
Freq: Once | INTRAVENOUS | Status: AC
  Administered 2011-07-03: 09:00:00 via INTRAVENOUS

## 2011-07-03 MED ORDER — SIMETHICONE 80 MG PO CHEW
80.0000 mg | CHEWABLE_TABLET | Freq: Three times a day (TID) | ORAL | Status: DC
  Administered 2011-07-03 – 2011-07-06 (×7): 80 mg via ORAL

## 2011-07-03 SURGICAL SUPPLY — 31 items
BENZOIN TINCTURE PRP APPL 2/3 (GAUZE/BANDAGES/DRESSINGS) ×2 IMPLANT
BOOTIES KNEE HIGH SLOAN (MISCELLANEOUS) ×4 IMPLANT
CHLORAPREP W/TINT 26ML (MISCELLANEOUS) ×2 IMPLANT
CLOTH BEACON ORANGE TIMEOUT ST (SAFETY) ×2 IMPLANT
DRAIN JACKSON PRT FLT 10 (DRAIN) ×2 IMPLANT
DRESSING TELFA 8X3 (GAUZE/BANDAGES/DRESSINGS) ×2 IMPLANT
ELECT REM PT RETURN 9FT ADLT (ELECTROSURGICAL) ×2
ELECTRODE REM PT RTRN 9FT ADLT (ELECTROSURGICAL) ×1 IMPLANT
EVACUATOR SILICONE 100CC (DRAIN) ×2 IMPLANT
EXTRACTOR VACUUM M CUP 4 TUBE (SUCTIONS) IMPLANT
GAUZE SPONGE 4X4 12PLY STRL LF (GAUZE/BANDAGES/DRESSINGS) ×2 IMPLANT
GLOVE BIOGEL PI IND STRL 7.0 (GLOVE) ×2 IMPLANT
GLOVE BIOGEL PI INDICATOR 7.0 (GLOVE) ×2
GLOVE ECLIPSE 6.5 STRL STRAW (GLOVE) ×2 IMPLANT
GOWN PREVENTION PLUS LG XLONG (DISPOSABLE) ×6 IMPLANT
NEEDLE HYPO 22GX1.5 SAFETY (NEEDLE) ×2 IMPLANT
NS IRRIG 1000ML POUR BTL (IV SOLUTION) ×4 IMPLANT
PACK C SECTION WH (CUSTOM PROCEDURE TRAY) ×2 IMPLANT
PAD ABD 7.5X8 STRL (GAUZE/BANDAGES/DRESSINGS) ×2 IMPLANT
RETAINER VISCERAL (MISCELLANEOUS) ×2 IMPLANT
RTRCTR C-SECT PINK 25CM LRG (MISCELLANEOUS) ×2 IMPLANT
STRIP CLOSURE SKIN 1/2X4 (GAUZE/BANDAGES/DRESSINGS) ×2 IMPLANT
SUT MNCRL AB 3-0 PS2 27 (SUTURE) ×2 IMPLANT
SUT SILK 2 0 FSL 18 (SUTURE) ×2 IMPLANT
SUT VIC AB 0 CTX 36 (SUTURE) ×2
SUT VIC AB 0 CTX36XBRD ANBCTRL (SUTURE) ×2 IMPLANT
SUT VIC AB 1 CT1 36 (SUTURE) ×4 IMPLANT
SYR 20CC LL (SYRINGE) ×2 IMPLANT
TOWEL OR 17X24 6PK STRL BLUE (TOWEL DISPOSABLE) ×4 IMPLANT
TRAY FOLEY CATH 14FR (SET/KITS/TRAYS/PACK) ×2 IMPLANT
WATER STERILE IRR 1000ML POUR (IV SOLUTION) ×2 IMPLANT

## 2011-07-03 NOTE — Progress Notes (Signed)
  S: RN called pt to report low temp, pt was feeling sleepy, warming blanket applied and temperature repeated and was normal  O: VSS, still using warming blanket PE WNL  A: stable post-op   P: continue to observe Routine post-op orders

## 2011-07-03 NOTE — Transfer of Care (Signed)
Immediate Anesthesia Transfer of Care Note  Patient: Teresa Perez  Procedure(s) Performed:  CESAREAN SECTION  Patient Location: PACU  Anesthesia Type: Spinal  Level of Consciousness: awake, alert  and oriented  Airway & Oxygen Therapy: Patient Spontanous Breathing  Post-op Assessment: Report given to PACU RN and Post -op Vital signs reviewed and stable  Post vital signs: Reviewed and stable  Complications: No apparent anesthesia complications

## 2011-07-03 NOTE — Brief Op Note (Signed)
   07/03/2011  10:34 AM  PATIENT:  Teresa Perez  35 y.o. female  PRE-OPERATIVE DIAGNOSIS:  Prior Cesarean Section at  [redacted] weeks gestation  POST-OPERATIVE DIAGNOSIS:  same  PROCEDURE:  Procedure(s): CESAREAN SECTION  SURGEON:  Surgeon(s): Esmeralda Arthur, MD MD  ASSISTANTS: Sanda Klein CNM   ANESTHESIA:   spinal  LOCAL MEDICATIONS USED:  MARCAINE 20 CC  SPECIMEN:  placenta  DISPOSITION OF SPECIMEN:  PATHOLOGY  COUNTS:  YES  ESTIMATED BLOOD LOSS: 600 cc  PATIENT DISPOSITION:  PACU - hemodynamically stable.     Genova Kiner A MD 07/03/2011 10:34 AM

## 2011-07-03 NOTE — Anesthesia Postprocedure Evaluation (Signed)
  Anesthesia Post-op Note  Patient: Teresa Perez  Procedure(s) Performed:  CESAREAN SECTION  Patient Location: PACU  Anesthesia Type: Spinal  Level of Consciousness: awake, alert  and oriented  Airway and Oxygen Therapy: Patient Spontanous Breathing  Post-op Pain: none  Post-op Assessment: Post-op Vital signs reviewed, Patient's Cardiovascular Status Stable, Respiratory Function Stable, Patent Airway, No signs of Nausea or vomiting, Pain level controlled, No headache, No backache, No residual numbness and No residual motor weakness  Post-op Vital Signs: Reviewed and stable  Complications: No apparent anesthesia complications

## 2011-07-03 NOTE — Interval H&P Note (Signed)
History and Physical Interval Note:   07/03/2011   10:34 AM   Teresa Perez  has presented today for surgery, with the diagnosis of Prior Cesarean Secton  The various methods of treatment have been discussed with the patient and family. After consideration of risks, benefits and other options for treatment, the patient has consented to  Procedure(s): CESAREAN SECTION as a surgical intervention .  The patients' history has been reviewed, patient examined, no change in status, stable for surgery.  I have reviewed the patients' chart and labs.  Questions were answered to the patient's satisfaction.     Silverio Lay A  MD

## 2011-07-03 NOTE — H&P (Signed)
Teresa Perez is a 35 y.o. female presenting for scheduled repeat c/s.  Pregnancy significant for: 1. Prev c/s x2 2. Smoker 3. CHTN 4. AMA 5. Hx GDM 6. Depression 7. Elevated BMI 8. Asthma as a child   Maternal Medical History:  Reason for admission: Admission for scheduled repeat c/s  Fetal activity: Perceived fetal activity is normal.    Prenatal complications: no prenatal complications   OB History    Grav Para Term Preterm Abortions TAB SAB Ect Mult Living   4 3 3  0 0 0 0 0 0 3     Past Medical History  Diagnosis Date  . Hypertension     pt states she was treated with hctz for pre-htn for approx 55mos-has not taken since 07/2010  . Asthma     childhood asthma  . Anxiety   . Depression     has been treated in past-no present meds   Past Surgical History  Procedure Date  . Cesarean section     x2 previous  . Mouth surgery 2004   Family History: family history includes Anesthesia problems in her son. Social History:  reports that she has been smoking Cigarettes.  She has a 4 pack-year smoking history. She has never used smokeless tobacco. She reports that she does not drink alcohol or use illicit drugs.  Pt is a single 35yo mixed race female, she works full-time and has 69yrs of education  Review of Systems  All other systems reviewed and are negative.      Blood pressure 134/82, pulse 75, temperature 97.9 F (36.6 C), temperature source Oral, resp. rate 18, height 5\' 6"  (1.676 m), weight 121.11 kg (267 lb), last menstrual period 09/25/2010, SpO2 99.00%. Maternal Exam:  Abdomen: Surgical scars: low transverse.   Fetal presentation: vertex  Introitus: not evaluated.     Fetal Exam Fetal Monitor Review: Mode: hand-held doppler probe.       Physical Exam  Constitutional: She is oriented to person, place, and time. She appears well-developed and well-nourished.  Neck: Normal range of motion.  Cardiovascular: Normal rate and normal heart sounds.   GI:  Soft. There is no tenderness.  Musculoskeletal: Normal range of motion.  Neurological: She is alert and oriented to person, place, and time.  Skin: Skin is warm and dry.  Psychiatric: She has a normal mood and affect. Her behavior is normal.    Prenatal labs: ABO, Rh: AB/Positive/-- (03/26 0000) Antibody: Negative (03/26 0000) Rubella: Immune (03/26 0000) RPR: NON REACTIVE (10/29 0856)  HBsAg: Negative (03/26 0000)  HIV: Non-reactive (03/26 0000)  GBS:   neg Genetic screens normal Early 1hr gtt 153, 3hr normal 3hr gtt 3rd trimester WNL   Assessment/Plan: 40wks IUP Repeat c/s  Admit to pre-op per dr rivard Routine orders    Bryona Foxworthy M 07/03/2011, 8:59 AM

## 2011-07-03 NOTE — Anesthesia Procedure Notes (Signed)
Spinal Block  Patient location during procedure: OR Start time: 07/03/2011 9:19 AM Staffing Anesthesiologist: Shakeel Disney A. Performed by: anesthesiologist  Preanesthetic Checklist Completed: patient identified, site marked, surgical consent, pre-op evaluation, timeout performed, IV checked, risks and benefits discussed and monitors and equipment checked Spinal Block Patient position: sitting Prep: site prepped and draped and DuraPrep Patient monitoring: heart rate, cardiac monitor, continuous pulse ox and blood pressure Approach: midline Location: L3-4 Injection technique: single-shot Needle Needle type: Sprotte  Needle gauge: 24 G Needle length: 9 cm Needle insertion depth: 6 cm Assessment Sensory level: T4 Additional Notes Patient tolerated procedure well. Adequate sensory level.

## 2011-07-03 NOTE — Op Note (Signed)
Preoperative diagnosis: Intrauterine pregnancy at 39 weeks and 0 days  Previous cesarean section  Post operative diagnosis: Same  Anesthesia: Spinal  Anesthesiologist: Dr. Malen Gauze  Procedure: Repeat low transverse cesarean section  Surgeon: Dr. Dois Davenport Giannina Bartolome  Assistant: Sanda Klein CNM  Estimated blood loss: 600 cc  Procedure:  After being informed of the planned procedure and possible complications including bleeding, infection, injury to other organs, informed consent is obtained. The patient is taken to OR #9 and given spinal anesthesia without complication. She is placed in the dorsal decubitus position with the pelvis tilted to the left. She is then prepped and draped in a sterile fashion. A Foley catheter is inserted in her bladder.  After assessing adequate level of anesthesia, we infiltrate the suprapubic area with 20 cc of Marcaine 0.25 and perform a Pfannenstiel incision which is brought down sharply to the fascia. The fascia is entered in a low transverse fashion. Linea alba is dissected. Peritoneum is entered in a midline fashion. An Alexis retractor is easily positioned. Visceral peritoneum is entered in a low transverse fashion allowing [place] to safely retract bladder by developing a bladder flap.  The myometrium is then entered in a low transverse fashion; first with knife and then extended bluntly. Amniotic fluid is clear. We assist the birth of a female  infant in vertex presentation. Mouth and nose are suctioned. The baby is delivered. The cord is clamped and sectioned. The baby is given to the neonatologist present in the room.  10 cc of blood is drawn from the umbilical vein.The placenta is allowed to deliver spontaneously. It is complete and the cord has 3 vessels. Uterine revision is negative.  We proceed with closure of the myometrium in 2 layers: First with a running locked suture of 0 Vicryl, then with a Lembert suture of 0 Vicryl imbricating the first one.  Hemostasis is completed with cauterization on peritoneal edges.  Both paracolic gutters are cleaned. Both tubes and ovaries are assessed and normal. The pelvis is profusely irrigated with warm saline to confirm a satisfactory hemostasis.  Retractors and sponges are removed. Under fascia hemostasis is completed with cauterization. The fascia is then closed with 2 running sutures of 0 Vicryl meeting midline. A #10 JP drain is positioned in the wound with a left contra-incision, held in place with a 0 Silk suture. The wound is irrigated with warm saline and hemostasis is completed with cauterization. The skin is closed with a subcuticular suture of 3-0 Monocryl and Steri-Strips.  Instrument and sponge count is complete x2. Estimated blood loss is 600 cc.  The procedure is well tolerated by the patient who is taken to recovery room in a well and stable condition.  female baby named Royelle received an Apgar of 8  at 1 minute and 9 at 5 minutes. Weight was 6 lbs  15 oz.    Specimen: Placenta sent to [facility]   Amadou Katzenstein A MD 11/2/201210:38 AM

## 2011-07-03 NOTE — Addendum Note (Signed)
Addendum  created 07/03/11 1713 by Fanny Dance   Modules edited:Notes Section

## 2011-07-03 NOTE — Anesthesia Preprocedure Evaluation (Signed)
Anesthesia Evaluation  Patient identified by MRN, date of birth, ID band Patient awake    Reviewed: Allergy & Precautions, H&P , NPO status , Patient's Chart, lab work & pertinent test results  History of Anesthesia Complications (+) PONV  Airway Mallampati: I TM Distance: >3 FB Neck ROM: Full    Dental No notable dental hx. (+) Teeth Intact   Pulmonary neg pulmonary ROS,  clear to auscultation  Pulmonary exam normal       Cardiovascular hypertension, neg cardio ROS Regular Normal    Neuro/Psych PSYCHIATRIC DISORDERS Negative Neurological ROS  Negative Psych ROS   GI/Hepatic negative GI ROS, Neg liver ROS,   Endo/Other  Negative Endocrine ROSMorbid obesity  Renal/GU negative Renal ROS  Genitourinary negative   Musculoskeletal   Abdominal   Peds  Hematology negative hematology ROS (+)   Anesthesia Other Findings   Reproductive/Obstetrics (+) Pregnancy                           Anesthesia Physical Anesthesia Plan  ASA: III  Anesthesia Plan: Spinal   Post-op Pain Management:    Induction:   Airway Management Planned:   Additional Equipment:   Intra-op Plan:   Post-operative Plan:   Informed Consent: I have reviewed the patients History and Physical, chart, labs and discussed the procedure including the risks, benefits and alternatives for the proposed anesthesia with the patient or authorized representative who has indicated his/her understanding and acceptance.     Plan Discussed with: Anesthesiologist, CRNA and Surgeon  Anesthesia Plan Comments:         Anesthesia Quick Evaluation

## 2011-07-03 NOTE — Progress Notes (Signed)
Oral and axillary temperature unreadable.  Patient's rectal temperature 95.7 degrees F at 1445. Patient states she "just feels jittery, hot, and sweaty."  Patient's skin cool.  Notified Almond Lint, CNM, who ordered to place warm blankets on patient and if warmer available, to place warmer on patient, then to recheck temperature in one hour.  Placed warm blankets on patient immediately until warmer available within about 10 minutes.  Blood sugar 121.  All other VS within normal limits.  At 1550 temperature 97.2 orally.  Notified Shelly once more with recent temp.  Shelly ordered to keep warmer on patient until temp greater than 98.0 degrees F.  Patient vomited also 300cc at 1530 in which zofran was given.  Shelly notified of this at same phone call.  Patient has no other complaints except for feeling very sleepy and "out of it."  Patient alert and oriented x4.  Encouraged pt to rest.  Will continue to monitor closely.  Earl Gala, Linda Hedges [place]

## 2011-07-03 NOTE — Anesthesia Postprocedure Evaluation (Signed)
  Anesthesia Post-op Note  Patient: Teresa Perez  Procedure(s) Performed:  CESAREAN SECTION  Patient Location: Mother/Baby  Anesthesia Type: Spinal  Level of Consciousness: alert  and oriented  Airway and Oxygen Therapy: Patient Spontanous Breathing  Post-op Pain: mild  Post-op Assessment: Patient's Cardiovascular Status Stable and Respiratory Function Stable  Post-op Vital Signs: stable  Complications: No apparent anesthesia complications

## 2011-07-04 LAB — CBC
Hemoglobin: 8.6 g/dL — ABNORMAL LOW (ref 12.0–15.0)
MCH: 25.5 pg — ABNORMAL LOW (ref 26.0–34.0)
MCHC: 32.1 g/dL (ref 30.0–36.0)

## 2011-07-04 MED ORDER — OXYTOCIN 10 UNIT/ML IJ SOLN
INTRAMUSCULAR | Status: AC
  Filled 2011-07-04: qty 1

## 2011-07-04 NOTE — Progress Notes (Signed)
PSYCHOSOCIAL ASSESSMENT ~ MATERNAL/CHILD Name: "Teresa Perez"         Age: 35    Referral Date :  07/03/2011   Reason/Source:  Hx of depression/CN  I. FAMILY/HOME ENVIRONMENT A. Child's Legal Guardian X Parent(s)  Name:  Teresa Perez DOB: [birth year]   Age: 35 Address:  [address] [place], [place] [address] Name:  Teresa Perez    B. Other Household Members/Support Persons   Brother 16   Brother 12   Sister    4  C.   Other Support   Maternal aunt   II. PSYCHOSOCIAL DATA A. 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 Cultural and Environment Information Cultural Issues Impacting Care:  N/A  III. STRENGTHS X Supportive family/friends   X Adequate Resources  X Home prepared for Child (including basic supplies)                         X Other:  Pediatrician - [facility]  IV. RISK FACTORS AND CURRENT PROBLEMS               X No Problems Noted                       V. SOCIAL WORK ASSESSMENT LCSW met with MOB at bedside to assess strengths and needs related to referral for history of depression.  MOB lives alone with her four children.  The FOB is helpful and supportive.  MOB is on unpaid maternity leave at this time, and plans to return to work in about 8 weeks.  She plans to apply for additional benefits during this time.  Her children are very excited with baby's arrival.  She anticipates they will be very helpful.  MOB reports she had postpartum depress following a previous pregnancy and she was managed well with medication with the support of her OB.  MOB anticipates a normal postpartum experience.  Her pregnancy and delivery went well.  Her postpartum recovery has been good so far, and she feels well overall.  She describes herself as "cool"  and "calm" right now.  MOB also has good support from her sister, who helps out when needed.  MOB anticipates baby will go the daycare at [employer], where her 22 year old currently attends.  She plans to breastfeed and it has been going well.  She reports baby is feeding and latching well.  She hopes to obtain a pump in the future to be able to enlist support of others in helping to feed baby when she needs assistance.  MOB did not express any current concerns or needs.  She was happy, expressive, and appreciative of visit.  Her mood and affect were congruent with thought content.  She has a good support network to assist when needed.  She feels well overall and reports good physical and emotional functioning.  MOB understands where she can access care for mood support should it be needed.    VI. SOCIAL WORK PLAN X No Further Intervention Required/No Barriers to Discharge   Staci Acosta, LCSW  07/04/2011, 5:03 pm

## 2011-07-04 NOTE — Progress Notes (Signed)
Post Partum Day 1 Cesarean Section Subjective: tolerating PO. No dizziness.  Objective: Blood pressure 123/77, pulse 77, temperature 98.9 F (37.2 C), temperature source Oral, resp. rate 20, height 5\' 6"  (1.676 m), weight 121.11 kg (267 lb), last menstrual period 09/25/2010, SpO2 98.00%.  Physical Exam:  General: alert and cooperative Lochia: appropriate Uterine Fundus: firm Incision: Dressing with a small stain DVT Evaluation: No evidence of DVT seen on physical exam.   Basename 07/04/11 0525  HGB 8.6*  HCT 26.8*    Assessment/Plan: Contraception Paragard Transfusion discussed. Risk and benefits reviewed. Questions answered. Declined for now. Will check orthostatics. Breast feeding. Possible discharge tomorrow.   LOS: 1 day   Teresa Perez 07/04/2011, 9:47 AM

## 2011-07-05 ENCOUNTER — Encounter (HOSPITAL_COMMUNITY): Payer: Self-pay | Admitting: *Deleted

## 2011-07-05 MED ORDER — GLYCERIN (LAXATIVE) 2.1 G RE SUPP
1.0000 | Freq: Every day | RECTAL | Status: DC | PRN
  Filled 2011-07-05: qty 1

## 2011-07-05 NOTE — Progress Notes (Signed)
Post Partum Day 2, Cesarean Sectiion Subjective: voiding and tolerating PO  Objective: Blood pressure 118/74, pulse 70, temperature 98.4 F (36.9 C), temperature source Oral, resp. rate 20, height 5\' 6"  (1.676 m), weight 121.11 kg (267 lb), last menstrual period 09/25/2010, SpO2 98.00%, unknown if currently breastfeeding.  Physical Exam:  General: alert Lochia: appropriate Uterine Fundus: firm Incision: healing well DVT Evaluation: No evidence of DVT seen on physical exam.   Basename 07/04/11 0525  HGB 8.6*  HCT 26.8*    Assessment/Plan: Plan for discharge tomorrow   LOS: 2 days   Maeva Dant V 07/05/2011, 10:21 AM

## 2011-07-06 ENCOUNTER — Encounter (HOSPITAL_COMMUNITY): Payer: Self-pay | Admitting: Obstetrics and Gynecology

## 2011-07-06 DIAGNOSIS — Z98891 History of uterine scar from previous surgery: Secondary | ICD-10-CM

## 2011-07-06 MED ORDER — IBUPROFEN 600 MG PO TABS: 600.0000 mg | ORAL_TABLET | Freq: Four times a day (QID) | ORAL | Status: AC | PRN

## 2011-07-06 MED ORDER — OXYCODONE-ACETAMINOPHEN 5-325 MG PO TABS: 1.0000 | ORAL_TABLET | ORAL | Status: AC | PRN

## 2011-07-06 NOTE — Discharge Summary (Signed)
Obstetric Discharge Summary Reason for Admission: cesarean section, repeat Prenatal Procedures: ultrasound Intrapartum Procedures: cesarean: low cervical, transverse Postpartum Procedures: none Complications-Operative and Postpartum: none  Temp:  [98.4 F (36.9 C)-98.6 F (37 C)] 98.5 F (36.9 C) (11/05 0603) Pulse Rate:  [64-78] 64  (11/05 0603) Resp:  [20] 20  (11/05 0603) BP: (126-131)/(75-84) 131/75 mmHg (11/05 0603) Hemoglobin  Date Value Range Status  07/04/2011 8.6* 12.0-15.0 (g/dL) Final     HCT  Date Value Range Status  07/04/2011 26.8* 36.0-46.0 (%) Final   Hgb 06/29/11:  10.7  Hospital Course:  Admitted on 07/03/11 for scheduled repeat C/S.  Hx chronic HTN, no meds.  Dr. Estanislado Pandy performed the repeat LTCS without complication.  [facility].  Patient had an essentially uncomplicated post-operative course, but did have slightly low temp following surgery.  A warming blanket was placed, and this resolved.  A JP drain was present from surgery, and it had a diminishing production throughout her stay, and was removed on the day of discharge without difficulty.  Her incision was CDI, her pain was well-controlled with po meds, and her physical exam was WNL.  She was orthostatically stable, and was placed on FE.  She was discharged home in good condition on 07/06/11.  She was breastfeeding.  Discharge Diagnoses: Term Pregnancy-delivered                                         Anemia--hemodynamically stable                                         Chronic HTN--stable, no meds  Discharge Information: Date: 07/06/2011 Activity: Per [facility] handout Diet: routine Contraception:  Plans Paragard at 6 weeks Medications:  Medication List  As of 07/06/2011  9:06 AM   START taking these medications         ibuprofen 600 MG tablet   Commonly known as: ADVIL,MOTRIN   Take 1 tablet (600 mg total) by mouth every 6 (six) hours as needed for pain.      oxyCODONE-acetaminophen 5-325 MG per  tablet   Commonly known as: PERCOCET   Take 1-2 tablets by mouth every 3 (three) hours as needed (moderate - severe pain).         CONTINUE taking these medications         FLORAJEN3 Caps   Take 1 capsule by mouth daily.      guaifenesin 100 MG/5ML syrup   Commonly known as: ROBITUSSIN      pseudoephedrine 30 MG tablet   Commonly known as: SUDAFED      terconazole 0.8 % vaginal cream   Commonly known as: TERAZOL 3   Place 1 applicator vaginally at bedtime.          Where to get your medications    These are the prescriptions that you need to pick up.   You may get these medications from any pharmacy.         ibuprofen 600 MG tablet   oxyCODONE-acetaminophen 5-325 MG per tablet          OTC FE 1 tablet po q day  Condition: stable Instructions: refer to practice specific booklet Discharge to: home Follow-up Information    [facility] up with [facility] in 5  weeks. (Call to schedule.  Call if any questions or concerns.)          Newborn Data: Live born  Information for the patient's newborn:  Bea, Duren [[id number]]  female ; APGAR 8/9 , ; weight 6+15 ;  Home with mother.  Bosten Newstrom 07/06/2011, 9:06 AM

## 2011-07-17 ENCOUNTER — Telehealth: Payer: Self-pay | Admitting: Obstetrics and Gynecology

## 2011-07-17 ENCOUNTER — Other Ambulatory Visit: Payer: Self-pay | Admitting: Obstetrics and Gynecology

## 2011-07-18 ENCOUNTER — Inpatient Hospital Stay (HOSPITAL_COMMUNITY)
Admission: AD | Admit: 2011-07-18 | Discharge: 2011-07-18 | Disposition: A | Discharge status: Home / Self Care | Payer: BC Managed Care – PPO | Source: Ambulatory Visit | Attending: Obstetrics and Gynecology | Admitting: Obstetrics and Gynecology

## 2011-07-18 ENCOUNTER — Encounter (HOSPITAL_COMMUNITY): Payer: Self-pay | Admitting: Obstetrics and Gynecology

## 2011-07-18 DIAGNOSIS — O99893 Other specified diseases and conditions complicating puerperium: Secondary | ICD-10-CM | POA: Insufficient documentation

## 2011-07-18 DIAGNOSIS — R030 Elevated blood-pressure reading, without diagnosis of hypertension: Secondary | ICD-10-CM

## 2011-07-18 NOTE — Progress Notes (Signed)
Pt told to com e in today for b/p check. Reports having headache  On and of no headache now.

## 2011-07-18 NOTE — ED Provider Notes (Signed)
History     Chief Complaint  Patient presents with  . Hypertension   HPI Pt presents for serial blood pressure evaluations.  Pt underwent C/S on 07/03/11.  [facility] RN had noted elevated BP after discharge.  Pt presented to [facility] office and serum PIH labs were drawn and 24hr urine was ordered.  24hr urine returned with 309mg  of protein and serum labs were WNL.  Pt denies headache, vision changes, RUQ pain or edema of the hands/face.  She denies fever, drainage or redness of her incision or increased pain.  She reports she is tolerating liquids and solids without difficulty and reports normal bowel/bladder function.  She is currently bottlefeeding.    OB History    Grav Para Term Preterm Abortions TAB SAB Ect Mult Living   4 4 4  0 0 0 0 0 0 4      Past Medical History  Diagnosis Date  . Hypertension     pt states she was treated with hctz for pre-htn for approx 79mos-has not taken since 07/2010  . Asthma     childhood asthma  . Anxiety   . Depression     has been treated in past-no present meds    Past Surgical History  Procedure Date  . Cesarean section     x2 previous  . Mouth surgery 2004  . Cesarean section 07/03/2011    Procedure: CESAREAN SECTION;  Surgeon: Esmeralda Arthur, MD;  Location: [facility];  Service: Gynecology;  Laterality: N/A;    Family History  Problem Relation Age of Onset  . Anesthesia problems Son     son-had T&A-2008-had trismus    History  Substance Use Topics  . Smoking status: Current Some Day Smoker -- 0.2 packs/day for 16 years    Types: Cigarettes  . Smokeless tobacco: Never Used  . Alcohol Use: No    Allergies:  Allergies  Allergen Reactions  . Latex Rash    Prescriptions prior to admission  Medication Sig Dispense Refill  . ibuprofen (ADVIL,MOTRIN) 600 MG tablet Take 600 mg by mouth every 6 (six) hours as needed. For pain.       Marland Kitchen oxyCODONE-acetaminophen (PERCOCET) 5-325 MG per tablet Take 1 tablet by mouth every 4 (four) hours as  needed. For pain.       . Probiotic Product [facility]) CAPS Take 1 capsule by mouth daily.  30 capsule  12    Review of Systems  Constitutional: Negative.   HENT: Negative.   Eyes: Negative.   Respiratory: Negative.   Cardiovascular: Negative.   Gastrointestinal: Negative.   Genitourinary: Negative.   Musculoskeletal: Negative.   Skin: Negative.   Endo/Heme/Allergies: Negative.   Psychiatric/Behavioral: Negative.    Physical Exam   Blood pressure 135/79, pulse 63, temperature 98.5 F (36.9 C), temperature source Oral, resp. rate 18, height 5\' 6"  (1.676 m), weight 111.766 kg (246 lb 6.4 oz), last menstrual period 09/25/2010. Filed Vitals:   07/18/11 1036 07/18/11 1100 07/18/11 1125  BP: 135/92 134/80 135/79  Pulse: 63    Temp: 98.5 F (36.9 C)    TempSrc: Oral    Resp: 18    Height: 5\' 6"  (1.676 m)    Weight: 111.766 kg (246 lb 6.4 oz)     Physical Exam  Constitutional: She is oriented to person, place, and time. She appears well-developed and well-nourished.  HENT:  Head: Normocephalic and atraumatic.  Right Ear: External ear normal.  Left Ear: External ear normal.  Nose: Nose normal.  Eyes: Conjunctivae are normal. Pupils are equal, round, and reactive to light.  Neck: Normal range of motion. Neck supple. No thyromegaly present.  Cardiovascular: Normal rate, regular rhythm and intact distal pulses.   Respiratory: Effort normal and breath sounds normal.  GI: Soft. Bowel sounds are normal.       Incision clean, dry and intact without redness.  Genitourinary: Vagina normal and uterus normal.       Ut firm, non-tender 2 below umbilicus.  Musculoskeletal: Normal range of motion.  Neurological: She is alert and oriented to person, place, and time. She has normal reflexes.  Skin: Skin is warm and dry.  Psychiatric: She has a normal mood and affect. Her behavior is normal. Judgment and thought content normal.    MAU Course  Procedures   Assessment and Plan  Post-Op  C/S -15 days Elevated blood pressure reading post-partum Mild proteinuria (non-catheterized specimen) Neg serum PIH labs No symptoms of preeclampsia  Consult with Dr. Normand Sloop Will discharge to home Pt to return to [facility] in the next week for repeat blood pressure evaluation  Roland Lipke O. 07/18/2011, 11:39 AM

## 2011-09-30 ENCOUNTER — Ambulatory Visit: Payer: BC Managed Care – PPO | Admitting: Internal Medicine

## 2011-12-14 NOTE — Telephone Encounter (Signed)
See note

## 2012-10-07 ENCOUNTER — Ambulatory Visit: Payer: Self-pay | Admitting: Obstetrics and Gynecology

## 2012-10-27 ENCOUNTER — Encounter: Payer: Self-pay | Admitting: Obstetrics and Gynecology

## 2012-10-27 ENCOUNTER — Ambulatory Visit: Payer: BC Managed Care – PPO | Admitting: Obstetrics and Gynecology

## 2012-10-27 VITALS — BP 118/80 | Ht 66.0 in | Wt 257.0 lb

## 2012-10-27 DIAGNOSIS — Z124 Encounter for screening for malignant neoplasm of cervix: Secondary | ICD-10-CM

## 2012-10-27 DIAGNOSIS — N926 Irregular menstruation, unspecified: Secondary | ICD-10-CM

## 2012-10-27 DIAGNOSIS — Z202 Contact with and (suspected) exposure to infections with a predominantly sexual mode of transmission: Secondary | ICD-10-CM

## 2012-10-27 LAB — RPR

## 2012-10-27 LAB — OB RESULTS CONSOLE ABO/RH

## 2012-10-27 NOTE — Progress Notes (Signed)
Last Pap: 2013 WNL: Yes Regular Periods:yes Contraception: n/a  Monthly Breast exam:yes Tetanus<33yrs:yes Nl.Bladder Function:yes Daily BMs:yes Healthy Diet:yes Calcium:no Mammogram:no Date of Mammogram: n/a Exercise:yes Have often Exercise: occ Seatbelt: yes Abuse at home: no Stressful work:no Sigmoid-colonoscopy: n/a Bone Density: No BP 118/80  Ht 5\' 6"  (1.676 m)  Wt 257 lb (116.574 kg)  BMI 41.5 kg/m2  LMP 09/22/2012  Breastfeeding? No  PCP: Dr.Everly Change in PMH: no change Change in FMH:no change Pt with complaints:no Physical Examination: General appearance - alert, well appearing, and in no distress Mental status - normal mood, behavior, speech, dress, motor activity, and thought processes Neck - supple, no significant adenopathy,  thyroid exam: thyroid is normal in size without nodules or tenderness Chest - clear to auscultation, no wheezes, rales or rhonchi, symmetric air entry Heart - normal rate and regular rhythm Abdomen - soft, nontender, nondistended, no masses or organomegaly Breasts - breasts appear normal, no suspicious masses, no skin or nipple changes or axillary nodes Pelvic - normal external genitalia, vulva, vagina, cervix, uterus and adnexa Rectal - rectal exam not indicated Back exam - full range of motion, no tenderness, palpable spasm or pain on motion Neurological - alert, oriented, normal speech, no focal findings or movement disorder noted Musculoskeletal - no joint tenderness, deformity or swelling Extremities - no edema, redness or tenderness in the calves or thighs Skin - normal coloration and turgor, no rashes, no suspicious skin lesions noted Routine exam Pap sent yes Mammogram due no  nothing used for contraception RT 2 weeks.  UPT positive c/w preg at 5 weeks.  Uterus about 8 week size Rt for dating Form filled out for job Sample PNV given

## 2012-10-28 LAB — PAP IG, CT-NG, RFX HPV ASCU: Chlamydia Probe Amp: NEGATIVE

## 2012-11-10 ENCOUNTER — Encounter: Payer: Self-pay | Admitting: Obstetrics and Gynecology

## 2012-11-10 ENCOUNTER — Ambulatory Visit: Payer: BC Managed Care – PPO | Admitting: Obstetrics and Gynecology

## 2012-11-10 ENCOUNTER — Other Ambulatory Visit: Payer: Self-pay | Admitting: Obstetrics and Gynecology

## 2012-11-10 ENCOUNTER — Ambulatory Visit: Payer: BC Managed Care – PPO

## 2012-11-10 VITALS — BP 112/72 | Wt 262.0 lb

## 2012-11-10 DIAGNOSIS — N926 Irregular menstruation, unspecified: Secondary | ICD-10-CM

## 2012-11-10 DIAGNOSIS — D219 Benign neoplasm of connective and other soft tissue, unspecified: Secondary | ICD-10-CM

## 2012-11-10 DIAGNOSIS — Z3490 Encounter for supervision of normal pregnancy, unspecified, unspecified trimester: Secondary | ICD-10-CM

## 2012-11-10 LAB — US OB TRANSVAGINAL

## 2012-11-10 NOTE — Progress Notes (Signed)
GYN PROBLEM VISIT  [name] is a 37 y.o. year old female,[id number], who presents for a problem visit.   Subjective:  Pt here for dating U/S.  Was made aware of unplanned pregnancy at [facility].  Asked me about tubal sterilization and ETP.  Objective:  LMP 09/22/2012  BP 112/72  Wt 262 lb (118.842 kg)  BMI 42.31 kg/m2  LMP 09/22/2012  Breastfeeding? No URINE- trace protein, negative glucose Ultrasound shows:  SIUP  S=D     [place] EDD: 07/03/2013  FHR=122 BPM Anteverted uterus. One, left lateral fibroid is visualized. Intramural: 3.0cm x 2.6cm x 2.7cm  Single gestational sac with yolk sac. Fetal pole identified. CRL=GA of [redacted]w[redacted]d. Normal ovaries/adnexa.   Assessment: IU[P at 7wks by dates, [redacted]w[redacted]d by U/S Unplanned, unintended pregnancy   Plan: Continue PNV Make NOB appt and cancel if ETP is decided, and RTO for aex or prn   Dierdre Forth, MD  11/10/2012 8:58 AM

## 2013-05-31 ENCOUNTER — Other Ambulatory Visit: Payer: Self-pay | Admitting: Obstetrics and Gynecology

## 2013-06-14 ENCOUNTER — Encounter (HOSPITAL_COMMUNITY): Payer: Self-pay | Admitting: Pharmacist

## 2013-06-19 ENCOUNTER — Encounter (HOSPITAL_COMMUNITY): Payer: Self-pay

## 2013-06-20 ENCOUNTER — Encounter (HOSPITAL_COMMUNITY)
Admission: RE | Admit: 2013-06-20 | Discharge: 2013-06-20 | Disposition: A | Discharge status: Home / Self Care | Payer: BC Managed Care – PPO | Source: Ambulatory Visit | Attending: Obstetrics and Gynecology | Admitting: Obstetrics and Gynecology

## 2013-06-20 ENCOUNTER — Other Ambulatory Visit: Payer: Self-pay | Admitting: Obstetrics and Gynecology

## 2013-06-20 ENCOUNTER — Encounter (HOSPITAL_COMMUNITY): Payer: Self-pay

## 2013-06-20 HISTORY — DX: Headache: R51

## 2013-06-20 HISTORY — DX: Family history of other specified conditions: Z84.89

## 2013-06-20 LAB — BASIC METABOLIC PANEL
Chloride: 105 mEq/L (ref 96–112)
Creatinine, Ser: 0.54 mg/dL (ref 0.50–1.10)
GFR calc Af Amer: >90 mL/min (ref >90)
GFR calc non Af Amer: >90 mL/min (ref >90)
Potassium: 3.3 mEq/L — ABNORMAL LOW (ref 3.5–5.1)

## 2013-06-20 LAB — CBC
Hemoglobin: 11.1 g/dL — ABNORMAL LOW (ref 12.0–15.0)
MCHC: 32.6 g/dL (ref 30.0–36.0)
Platelets: 295 10*3/uL (ref 150–400)
RBC: 4.44 MIL/uL (ref 3.87–5.11)

## 2013-06-20 LAB — PREPARE RBC (CROSSMATCH)

## 2013-06-20 LAB — RPR: RPR Ser Ql: NONREACTIVE

## 2013-06-20 NOTE — Patient Instructions (Signed)
Your procedure is scheduled on:06/22/13  Enter through the Main Entrance at :10:45 am Pick up desk phone and dial [phone] and inform us of your arrival.  Please call [phone] if you have any problems the morning of surgery.  Remember: Do not eat food after midnight:Wed. Clear liquids are ok until:8am on Thursday   You may brush your teeth the morning of surgery.    DO NOT wear jewelry, eye make-up, lipstick,body lotion, or dark fingernail polish.  (Polished toes are ok) You may wear deodorant.  If you are to be admitted after surgery, leave suitcase in car until your room has been assigned. Patients discharged on the day of surgery will not be allowed to drive home. Wear loose fitting, comfortable clothes for your ride home.

## 2013-06-22 ENCOUNTER — Encounter (HOSPITAL_COMMUNITY)
Admission: RE | Disposition: A | Discharge status: Home / Self Care | Payer: Self-pay | Source: Ambulatory Visit | Attending: Obstetrics and Gynecology

## 2013-06-22 ENCOUNTER — Inpatient Hospital Stay (HOSPITAL_COMMUNITY)
Admission: RE | Admit: 2013-06-22 | Discharge: 2013-06-24 | DRG: 765 | Disposition: A | Discharge status: Home / Self Care | Payer: BC Managed Care – PPO | Source: Ambulatory Visit | Attending: Obstetrics and Gynecology | Admitting: Obstetrics and Gynecology

## 2013-06-22 ENCOUNTER — Encounter (HOSPITAL_COMMUNITY): Payer: Self-pay | Admitting: Certified Registered"

## 2013-06-22 ENCOUNTER — Encounter (HOSPITAL_COMMUNITY): Payer: BC Managed Care – PPO | Admitting: Certified Registered"

## 2013-06-22 ENCOUNTER — Inpatient Hospital Stay (HOSPITAL_COMMUNITY): Payer: BC Managed Care – PPO | Admitting: Certified Registered"

## 2013-06-22 DIAGNOSIS — Z98891 History of uterine scar from previous surgery: Secondary | ICD-10-CM

## 2013-06-22 DIAGNOSIS — R0989 Other specified symptoms and signs involving the circulatory and respiratory systems: Secondary | ICD-10-CM | POA: Diagnosis not present

## 2013-06-22 DIAGNOSIS — J989 Respiratory disorder, unspecified: Secondary | ICD-10-CM | POA: Diagnosis not present

## 2013-06-22 DIAGNOSIS — D649 Anemia, unspecified: Secondary | ICD-10-CM | POA: Diagnosis not present

## 2013-06-22 DIAGNOSIS — O34219 Maternal care for unspecified type scar from previous cesarean delivery: Principal | ICD-10-CM | POA: Diagnosis present

## 2013-06-22 DIAGNOSIS — O09529 Supervision of elderly multigravida, unspecified trimester: Secondary | ICD-10-CM | POA: Diagnosis present

## 2013-06-22 DIAGNOSIS — O99814 Abnormal glucose complicating childbirth: Secondary | ICD-10-CM | POA: Diagnosis present

## 2013-06-22 DIAGNOSIS — E669 Obesity, unspecified: Secondary | ICD-10-CM | POA: Diagnosis present

## 2013-06-22 DIAGNOSIS — Z6841 Body Mass Index (BMI) 40.0 and over, adult: Secondary | ICD-10-CM

## 2013-06-22 DIAGNOSIS — O99334 Smoking (tobacco) complicating childbirth: Secondary | ICD-10-CM | POA: Diagnosis present

## 2013-06-22 DIAGNOSIS — R7301 Impaired fasting glucose: Secondary | ICD-10-CM | POA: Diagnosis not present

## 2013-06-22 LAB — GLUCOSE, CAPILLARY: Glucose-Capillary: 81 mg/dL (ref 70–99)

## 2013-06-22 SURGERY — Surgical Case
Anesthesia: Epidural | Site: Abdomen | Wound class: Clean Contaminated

## 2013-06-22 MED ORDER — SODIUM BICARBONATE 8.4 % IV SOLN
INTRAVENOUS | Status: AC
  Filled 2013-06-22: qty 50

## 2013-06-22 MED ORDER — MEPERIDINE HCL 25 MG/ML IJ SOLN: 6.2500 mg | INTRAMUSCULAR | Status: DC | PRN

## 2013-06-22 MED ORDER — ONDANSETRON HCL 4 MG PO TABS: 4.0000 mg | ORAL_TABLET | ORAL | Status: DC | PRN

## 2013-06-22 MED ORDER — ONDANSETRON HCL 4 MG/2ML IJ SOLN
INTRAMUSCULAR | Status: AC
  Filled 2013-06-22: qty 2

## 2013-06-22 MED ORDER — BUPIVACAINE HCL (PF) 0.25 % IJ SOLN
INTRAMUSCULAR | Status: AC
  Filled 2013-06-22: qty 30

## 2013-06-22 MED ORDER — NALOXONE HCL 1 MG/ML IJ SOLN
1.0000 ug/kg/h | INTRAVENOUS | Status: DC | PRN
  Filled 2013-06-22: qty 2

## 2013-06-22 MED ORDER — DIPHENHYDRAMINE HCL 25 MG PO CAPS
25.0000 mg | ORAL_CAPSULE | ORAL | Status: DC | PRN
  Filled 2013-06-22: qty 1

## 2013-06-22 MED ORDER — IBUPROFEN 600 MG PO TABS
600.0000 mg | ORAL_TABLET | Freq: Four times a day (QID) | ORAL | Status: DC
  Administered 2013-06-22 – 2013-06-24 (×7): 600 mg via ORAL
  Filled 2013-06-22 (×7): qty 1

## 2013-06-22 MED ORDER — SODIUM CHLORIDE 0.9 % IJ SOLN: 3.0000 mL | INTRAMUSCULAR | Status: DC | PRN

## 2013-06-22 MED ORDER — OXYTOCIN 40 UNITS IN LACTATED RINGERS INFUSION - SIMPLE MED: 62.5000 mL/h | INTRAVENOUS | Status: AC

## 2013-06-22 MED ORDER — KETOROLAC TROMETHAMINE 60 MG/2ML IM SOLN
60.0000 mg | Freq: Once | INTRAMUSCULAR | Status: AC | PRN
  Administered 2013-06-22: 60 mg via INTRAMUSCULAR

## 2013-06-22 MED ORDER — FENTANYL CITRATE 0.05 MG/ML IJ SOLN
INTRAMUSCULAR | Status: AC
  Filled 2013-06-22: qty 2

## 2013-06-22 MED ORDER — BUPIVACAINE HCL (PF) 0.25 % IJ SOLN
INTRAMUSCULAR | Status: DC | PRN
  Administered 2013-06-22: 20 mL

## 2013-06-22 MED ORDER — KETOROLAC TROMETHAMINE 30 MG/ML IJ SOLN: 30.0000 mg | Freq: Four times a day (QID) | INTRAMUSCULAR | Status: AC | PRN

## 2013-06-22 MED ORDER — PNEUMOCOCCAL VAC POLYVALENT 25 MCG/0.5ML IJ INJ
0.5000 mL | INJECTION | INTRAMUSCULAR | Status: DC
  Filled 2013-06-22: qty 0.5

## 2013-06-22 MED ORDER — OXYCODONE-ACETAMINOPHEN 5-325 MG PO TABS
1.0000 | ORAL_TABLET | ORAL | Status: DC | PRN
  Administered 2013-06-23 – 2013-06-24 (×3): 1 via ORAL
  Filled 2013-06-22 (×3): qty 1

## 2013-06-22 MED ORDER — PHENYLEPHRINE 8 MG IN D5W 100 ML (0.08MG/ML) PREMIX OPTIME
INJECTION | INTRAVENOUS | Status: DC | PRN
  Administered 2013-06-22: 60 ug/min via INTRAVENOUS

## 2013-06-22 MED ORDER — DIBUCAINE 1 % RE OINT: 1.0000 "application " | TOPICAL_OINTMENT | RECTAL | Status: DC | PRN

## 2013-06-22 MED ORDER — LACTATED RINGERS IV SOLN
INTRAVENOUS | Status: DC
  Administered 2013-06-22: 23:00:00 via INTRAVENOUS

## 2013-06-22 MED ORDER — NALBUPHINE SYRINGE 5 MG/0.5 ML
5.0000 mg | INJECTION | INTRAMUSCULAR | Status: DC | PRN
  Administered 2013-06-22: 5 mg via INTRAVENOUS
  Filled 2013-06-22 (×2): qty 1

## 2013-06-22 MED ORDER — ONDANSETRON HCL 4 MG/2ML IJ SOLN
4.0000 mg | INTRAMUSCULAR | Status: DC | PRN
  Administered 2013-06-22: 4 mg via INTRAVENOUS
  Filled 2013-06-22: qty 2

## 2013-06-22 MED ORDER — SIMETHICONE 80 MG PO CHEW
80.0000 mg | CHEWABLE_TABLET | ORAL | Status: DC | PRN
  Administered 2013-06-22: 80 mg via ORAL

## 2013-06-22 MED ORDER — PRENATAL MULTIVITAMIN CH
1.0000 | ORAL_TABLET | Freq: Every day | ORAL | Status: DC
  Administered 2013-06-23 – 2013-06-24 (×2): 1 via ORAL
  Filled 2013-06-22 (×2): qty 1

## 2013-06-22 MED ORDER — LIDOCAINE-EPINEPHRINE (PF) 2 %-1:200000 IJ SOLN
INTRAMUSCULAR | Status: AC
  Filled 2013-06-22: qty 40

## 2013-06-22 MED ORDER — WITCH HAZEL-GLYCERIN EX PADS: 1.0000 "application " | MEDICATED_PAD | CUTANEOUS | Status: DC | PRN

## 2013-06-22 MED ORDER — ONDANSETRON HCL 4 MG/2ML IJ SOLN: 4.0000 mg | Freq: Three times a day (TID) | INTRAMUSCULAR | Status: DC | PRN

## 2013-06-22 MED ORDER — MORPHINE SULFATE 0.5 MG/ML IJ SOLN
INTRAMUSCULAR | Status: AC
  Filled 2013-06-22: qty 10

## 2013-06-22 MED ORDER — SCOPOLAMINE 1 MG/3DAYS TD PT72: 1.0000 | MEDICATED_PATCH | Freq: Once | TRANSDERMAL | Status: DC

## 2013-06-22 MED ORDER — SIMETHICONE 80 MG PO CHEW
80.0000 mg | CHEWABLE_TABLET | Freq: Three times a day (TID) | ORAL | Status: DC
  Administered 2013-06-23 – 2013-06-24 (×5): 80 mg via ORAL
  Filled 2013-06-22 (×3): qty 1

## 2013-06-22 MED ORDER — CEFAZOLIN SODIUM-DEXTROSE 2-3 GM-% IV SOLR
INTRAVENOUS | Status: AC
  Filled 2013-06-22: qty 50

## 2013-06-22 MED ORDER — NALOXONE HCL 0.4 MG/ML IJ SOLN: 0.4000 mg | INTRAMUSCULAR | Status: DC | PRN

## 2013-06-22 MED ORDER — ZOLPIDEM TARTRATE 5 MG PO TABS: 5.0000 mg | ORAL_TABLET | Freq: Every evening | ORAL | Status: DC | PRN

## 2013-06-22 MED ORDER — LACTATED RINGERS IV SOLN
Freq: Once | INTRAVENOUS | Status: AC
  Administered 2013-06-22: 11:00:00 via INTRAVENOUS

## 2013-06-22 MED ORDER — MENTHOL 3 MG MT LOZG: 1.0000 | LOZENGE | OROMUCOSAL | Status: DC | PRN

## 2013-06-22 MED ORDER — METOCLOPRAMIDE HCL 5 MG/ML IJ SOLN: 10.0000 mg | Freq: Three times a day (TID) | INTRAMUSCULAR | Status: DC | PRN

## 2013-06-22 MED ORDER — TETANUS-DIPHTH-ACELL PERTUSSIS 5-2.5-18.5 LF-MCG/0.5 IM SUSP
0.5000 mL | Freq: Once | INTRAMUSCULAR | Status: AC
  Administered 2013-06-23: 0.5 mL via INTRAMUSCULAR
  Filled 2013-06-22: qty 0.5

## 2013-06-22 MED ORDER — DIPHENHYDRAMINE HCL 50 MG/ML IJ SOLN: 12.5000 mg | INTRAMUSCULAR | Status: DC | PRN

## 2013-06-22 MED ORDER — KETOROLAC TROMETHAMINE 60 MG/2ML IM SOLN
INTRAMUSCULAR | Status: AC
  Filled 2013-06-22: qty 2

## 2013-06-22 MED ORDER — DIPHENHYDRAMINE HCL 25 MG PO CAPS: 25.0000 mg | ORAL_CAPSULE | Freq: Four times a day (QID) | ORAL | Status: DC | PRN

## 2013-06-22 MED ORDER — NALBUPHINE SYRINGE 5 MG/0.5 ML
5.0000 mg | INJECTION | INTRAMUSCULAR | Status: DC | PRN
  Filled 2013-06-22: qty 1

## 2013-06-22 MED ORDER — OXYTOCIN 10 UNIT/ML IJ SOLN
INTRAMUSCULAR | Status: AC
  Filled 2013-06-22: qty 4

## 2013-06-22 MED ORDER — MORPHINE SULFATE (PF) 0.5 MG/ML IJ SOLN
INTRAMUSCULAR | Status: DC | PRN
Start: 2013-06-22 — End: 2013-06-22
  Administered 2013-06-22: 3 mg via EPIDURAL

## 2013-06-22 MED ORDER — LANOLIN HYDROUS EX OINT: 1.0000 "application " | TOPICAL_OINTMENT | CUTANEOUS | Status: DC | PRN

## 2013-06-22 MED ORDER — SCOPOLAMINE 1 MG/3DAYS TD PT72
1.0000 | MEDICATED_PATCH | Freq: Once | TRANSDERMAL | Status: DC
  Administered 2013-06-22: 1.5 mg via TRANSDERMAL

## 2013-06-22 MED ORDER — SENNOSIDES-DOCUSATE SODIUM 8.6-50 MG PO TABS
2.0000 | ORAL_TABLET | ORAL | Status: DC
  Administered 2013-06-22: 2 via ORAL
  Filled 2013-06-22: qty 2

## 2013-06-22 MED ORDER — SCOPOLAMINE 1 MG/3DAYS TD PT72
MEDICATED_PATCH | TRANSDERMAL | Status: AC
  Administered 2013-06-22: 1.5 mg via TRANSDERMAL
  Filled 2013-06-22: qty 1

## 2013-06-22 MED ORDER — SODIUM BICARBONATE 8.4 % IV SOLN
INTRAVENOUS | Status: DC | PRN
  Administered 2013-06-22: 5 mL via EPIDURAL
  Administered 2013-06-22: 3 mL via EPIDURAL
  Administered 2013-06-22: 5 mL via EPIDURAL
  Administered 2013-06-22: 7 mL via EPIDURAL
  Administered 2013-06-22: 5 mL via EPIDURAL

## 2013-06-22 MED ORDER — DIPHENHYDRAMINE HCL 50 MG/ML IJ SOLN: 25.0000 mg | INTRAMUSCULAR | Status: DC | PRN

## 2013-06-22 MED ORDER — FENTANYL CITRATE 0.05 MG/ML IJ SOLN: 25.0000 ug | INTRAMUSCULAR | Status: DC | PRN

## 2013-06-22 MED ORDER — LACTATED RINGERS IV SOLN
INTRAVENOUS | Status: DC
  Administered 2013-06-22 (×4): via INTRAVENOUS

## 2013-06-22 MED ORDER — CEFAZOLIN SODIUM-DEXTROSE 2-3 GM-% IV SOLR
2.0000 g | INTRAVENOUS | Status: AC
  Administered 2013-06-22: 2 g via INTRAVENOUS

## 2013-06-22 MED ORDER — SIMETHICONE 80 MG PO CHEW
80.0000 mg | CHEWABLE_TABLET | ORAL | Status: DC
  Filled 2013-06-22 (×2): qty 1

## 2013-06-22 MED ORDER — OXYTOCIN 10 UNIT/ML IJ SOLN
40.0000 [IU] | INTRAVENOUS | Status: DC | PRN
  Administered 2013-06-22: 40 [IU] via INTRAVENOUS

## 2013-06-22 SURGICAL SUPPLY — 47 items
BENZOIN TINCTURE PRP APPL 2/3 (GAUZE/BANDAGES/DRESSINGS) IMPLANT
BLADE EXTENDED COATED 6.5IN (ELECTRODE) IMPLANT
BOOTIES KNEE HIGH SLOAN (MISCELLANEOUS) ×6 IMPLANT
CELLS DAT CNTRL 66122 CELL SVR (MISCELLANEOUS) ×2 IMPLANT
CLAMP CORD UMBIL (MISCELLANEOUS) ×3 IMPLANT
CLOTH BEACON ORANGE TIMEOUT ST (SAFETY) ×3 IMPLANT
CONTAINER PREFILL 10% NBF 15ML (MISCELLANEOUS) ×6 IMPLANT
DERMABOND ADVANCED (GAUZE/BANDAGES/DRESSINGS) ×1
DERMABOND ADVANCED .7 DNX12 (GAUZE/BANDAGES/DRESSINGS) ×2 IMPLANT
DRAIN JACKSON PRT FLT 7MM (DRAIN) IMPLANT
DRAPE LG THREE QUARTER DISP (DRAPES) ×6 IMPLANT
DRSG OPSITE POSTOP 4X10 (GAUZE/BANDAGES/DRESSINGS) ×3 IMPLANT
DURAPREP 26ML APPLICATOR (WOUND CARE) ×3 IMPLANT
ELECT CAUTERY BLADE 6.4 (BLADE) ×3 IMPLANT
ELECT REM PT RETURN 9FT ADLT (ELECTROSURGICAL) ×3
ELECTRODE REM PT RTRN 9FT ADLT (ELECTROSURGICAL) ×2 IMPLANT
EVACUATOR SILICONE 100CC (DRAIN) IMPLANT
EXTRACTOR VACUUM KIWI (MISCELLANEOUS) IMPLANT
EXTRACTOR VACUUM M CUP 4 TUBE (SUCTIONS) IMPLANT
GLOVE SURG SS PI 6.5 STRL IVOR (GLOVE) ×9 IMPLANT
GOWN PREVENTION PLUS XLARGE (GOWN DISPOSABLE) ×3 IMPLANT
GOWN STRL NON-REIN LRG LVL3 (GOWN DISPOSABLE) ×3 IMPLANT
GOWN STRL REIN XL XLG (GOWN DISPOSABLE) ×3 IMPLANT
KIT ABG SYR 3ML LUER SLIP (SYRINGE) IMPLANT
NEEDLE HYPO 25X5/8 SAFETYGLIDE (NEEDLE) ×3 IMPLANT
NEEDLE SPNL 22GX3.5 QUINCKE BK (NEEDLE) ×3 IMPLANT
NS IRRIG 1000ML POUR BTL (IV SOLUTION) ×3 IMPLANT
PACK C SECTION WH (CUSTOM PROCEDURE TRAY) ×3 IMPLANT
PAD OB MATERNITY 4.3X12.25 (PERSONAL CARE ITEMS) ×3 IMPLANT
RTRCTR WOUND ALEXIS 18CM MED (MISCELLANEOUS) ×3
STRIP CLOSURE SKIN 1/4X4 (GAUZE/BANDAGES/DRESSINGS) IMPLANT
SUT CHROMIC 2 0 SH (SUTURE) ×3 IMPLANT
SUT MNCRL AB 3-0 PS2 27 (SUTURE) ×3 IMPLANT
SUT SILK 0 FSL (SUTURE) IMPLANT
SUT VIC AB 0 CT1 27 (SUTURE) ×2
SUT VIC AB 0 CT1 27XBRD ANBCTR (SUTURE) ×4 IMPLANT
SUT VIC AB 0 CT1 36 (SUTURE) IMPLANT
SUT VIC AB 0 CTX 36 (SUTURE)
SUT VIC AB 0 CTX36XBRD ANBCTRL (SUTURE) IMPLANT
SUT VIC AB 2-0 CT1 27 (SUTURE) ×2
SUT VIC AB 2-0 CT1 TAPERPNT 27 (SUTURE) ×4 IMPLANT
SUT VIC AB 2-0 SH 27 (SUTURE)
SUT VIC AB 2-0 SH 27XBRD (SUTURE) IMPLANT
SYR CONTROL 10ML LL (SYRINGE) ×3 IMPLANT
TOWEL OR 17X24 6PK STRL BLUE (TOWEL DISPOSABLE) ×3 IMPLANT
TRAY FOLEY CATH 14FR (SET/KITS/TRAYS/PACK) ×3 IMPLANT
WATER STERILE IRR 1000ML POUR (IV SOLUTION) ×3 IMPLANT

## 2013-06-22 NOTE — Transfer of Care (Signed)
Immediate Anesthesia Transfer of Care Note  Patient: Teresa Perez  Procedure(s) Performed: Procedure(s): CESAREAN SECTION, repeat (N/A)  Patient Location: PACU  Anesthesia Type:Epidural  Level of Consciousness: awake, alert , oriented and patient cooperative  Airway & Oxygen Therapy: Patient Spontanous Breathing  Post-op Assessment: Report given to PACU RN and Post -op Vital signs reviewed and stable  Post vital signs: stable  Complications: No apparent anesthesia complications

## 2013-06-22 NOTE — Anesthesia Procedure Notes (Signed)
Epidural Patient location during procedure: OB Start time: 06/22/2013 12:48 PM  Staffing Anesthesiologist: Angus Seller., Harrell Gave. Performed by: anesthesiologist   Preanesthetic Checklist Completed: patient identified, site marked, surgical consent, pre-op evaluation, timeout performed, IV checked, risks and benefits discussed and monitors and equipment checked  Epidural Patient position: sitting Prep: site prepped and draped and DuraPrep Patient monitoring: continuous pulse ox and blood pressure Approach: midline Injection technique: LOR air  Needle:  Needle type: Tuohy  Needle gauge: 17 G Needle length: 9 cm and 9 Needle insertion depth: 8 cm Catheter type: closed end flexible Catheter size: 19 Gauge Catheter at skin depth: 14 cm Test dose: negative  Assessment Events: blood not aspirated, injection not painful, no injection resistance, negative IV test and no paresthesia  Additional Notes Patient identified.  Risk benefits discussed including failed block, incomplete pain control, headache, nerve damage, paralysis, blood pressure changes, nausea, vomiting, reactions to medication both toxic or allergic, and postpartum back pain.  Patient expressed understanding and wished to proceed.  All questions were answered.  Sterile technique used throughout procedure and epidural site dressed with sterile barrier dressing. No paresthesia or other complications noted.The patient did not experience any signs of intravascular injection such as tinnitus or metallic taste in mouth nor signs of intrathecal spread such as rapid motor block. Please see nursing notes for vital signs.

## 2013-06-22 NOTE — Anesthesia Postprocedure Evaluation (Signed)
  Anesthesia Post Note  Patient: Teresa Perez  Procedure(s) Performed: Procedure(s) (LRB): CESAREAN SECTION, repeat (N/A)  Anesthesia type: Epidural  Patient location: PACU  Post pain: Pain level controlled  Post assessment: Post-op Vital signs reviewed  Last Vitals:  Filed Vitals:   06/22/13 1445  BP: 120/77  Pulse: 77  Temp:   Resp: 22    Post vital signs: Reviewed  Level of consciousness: awake  Complications: No apparent anesthesia complications

## 2013-06-22 NOTE — Op Note (Signed)
Cesarean Section Procedure Note  Indications: previous uterine incision kerr x3 or greater  Pre-operative Diagnosis: 39 week 0 day pregnancy.  Post-operative Diagnosis: same  Surgeon: Hal Morales  First Assistant:  Surgeon: Hal Morales   Assistants: Henreitta Leber certified physician Asst.  Anesthesia: Epidural anesthesia  ASA Class: 3  Procedure Details  The patient was seen in the Holding Room. The risks, benefits, complications, treatment options, and expected outcomes were discussed with the patient.  The patient concurred with the proposed plan, giving informed consent.  The site of surgery properly noted/marked. The patient was taken to Operating Room # 9, and is aidentified as Teresa Perez and the procedure verified as C-Section Delivery. A Time Out was held and the above information confirmed.  After induction of anesthesia, the , abdomen  was  prepped with chlor prep  in the usual sterile manner.. The perineum was prepped with Betadine and A foley catheter was placed under sterile conditions.  The patient was then draped in the usual fashion.   Suprapubicsubcutaneous injection of 0.25% Bupivacaine  for a total of 20 cc.   A Pfannenstiel incision was made and carried down through the subcutaneous tissue to the fascia. Fascial incision was made and extended transversely. The fascia was separated from the underlying rectus tissue superiorly and inferiorly. The peritoneum was identified and entered. Peritoneal incision was extended longitudinally. . The Alexis retractor was placed.   The bladder blade was placed.   The bladder flap, which had been advanced anteriorly on the lower uterine segment was incised and developed.   A low transverse uterine incision was made, and that incision extended transversely bluntly.The infant was  delivered from occiput transverse presentation was a a living female infant with Apgar scores of 9 at one minute and 9 at five minutes. After the  umbilical cord was clamped and cut cord blood was obtained for evaluation. The placenta was removed intact and appeared normal. The uterine outline, tubes and ovaries appeared norma for the gravid state. The uterine incision was closed with running locked sutures of 0 Vicryl. An imbricating layer of sutures was placed. Hemostasis was observed. Lavage was carried out until clear. The peritoneum was closed with a running suture of 2-0 Vicryl.  The rectus muscles were reapproximated with a figure of 8 suture of 2-0 Vicryl.  The fascia was then reapproximated with a running sutures of 0 Vicryl .Renforcing figure of 8 sutures of 0Vicryl were placed on either side of midline.   The skin was reapproximated with 3-0 moncryl.  A sterile dressing was applied. . The infant remained in the operating room for initiation of skin to skin bonding with anticipated admission to the full-term nursery. The weight is pending at the time of dictation  Instrument, sponge, and needle counts were correct prior to the abdominal closure and at the conclusion of the case.   Findings:  Placenta contained a 3vessel cord    Estimated Blood Loss:  750cc         Drains:  none         Total IV Fluids:          Specimens:  placenta         Implants:  none         Complications: ::"None; patient tolerated the procedure well."         Disposition: PACU - hemodynamically stable.         Condition: stable  Attending Attestation: I performed the procedure.  Toneisha Savary P  06/22/2013 2:06 PM

## 2013-06-22 NOTE — H&P (Signed)
Teresa Perez is a 37 y.o. female presenting for elective repeat cesarean section . After 3 prior cesarean sections Maternal Medical History:  Prenatal Complications - Diabetes: gestational. Diabetes is managed by oral agent (monotherapy).      OB History   Grav Para Term Preterm Abortions TAB SAB Ect Mult Living   5 4 4  0 0 0 0 0 0 4     Past Medical History  Diagnosis Date  . Hypertension     pt states she was treated with hctz for pre-htn for approx 4mos-has not taken since 07/2010  . Asthma     childhood asthma  . Anxiety   . Depression     has been treated in past-no present meds  . PCOS (polycystic ovarian syndrome)   . Boil     tendency to form boils  . Hx of migraine headaches   . Headache(784.0)   . Family history of anesthesia complication 2008    son- age 85 - "had lockjaw" after surgery (T&A)   Past Surgical History  Procedure Laterality Date  . Cesarean section      x2 previous  . Mouth surgery  2004  . Cesarean section  07/03/2011    Procedure: CESAREAN SECTION;  Surgeon: Esmeralda Arthur, MD;  Location: [facility];  Service: Gynecology;  Laterality: N/A;   Family History: family history includes Anesthesia problems in her son. Social History:  reports that she has been smoking Cigarettes.  She has a 4 pack-year smoking history. She has never used smokeless tobacco. She reports that she does not drink alcohol or use illicit drugs.   Prenatal Transfer Tool  Maternal Diabetes: Yes:  Diabetes Type:  Insulin/Medication controlled 3 hr gtt was nl but fasting remained elevated until starting glyburide Genetic Screening: Normal [place] and AFP Maternal Ultrasounds/Referrals: Normal Fetal Ultrasounds or other Referrals:  None Maternal Substance Abuse:  Yes:  Type: Smoker Significant Maternal Medications:  Meds include: Other:  glyburide Significant Maternal Lab Results:  Lab values include: Group B Strep negative Other Comments:  None  Review of Systems   Constitutional: Negative.   HENT: Negative.   Eyes: Negative.   Respiratory: Negative.   Cardiovascular: Negative.   Gastrointestinal: Negative.   Genitourinary: Negative.   Musculoskeletal: Negative.   Skin: Negative for itching and rash.       Noticed the beginning of a vulvar "boil" on right labia this am  Neurological: Negative.   Endo/Heme/Allergies: Negative.       Blood pressure 126/86, pulse 75, temperature 97.5 F (36.4 C), temperature source Oral, resp. rate 18, last menstrual period 09/22/2012, SpO2 99.00%. Exam Physical Exam  Constitutional: She is oriented to person, place, and time.  obese  HENT:  Head: Normocephalic and atraumatic.  Eyes: Conjunctivae and EOM are normal.  Neck: Normal range of motion. Neck supple.  Cardiovascular: Normal rate and regular rhythm.   Respiratory: Effort normal and breath sounds normal.  GI: Soft.  gravid  Musculoskeletal: Normal range of motion.  Neurological: She is alert and oriented to person, place, and time.  Skin: Skin is warm and dry.  Psychiatric: She has a normal mood and affect. Her behavior is normal.    Prenatal labs: ABO, Rh: --/--/AB POS (10/21 0940) Antibody: NEG (10/21 0940) Rubella:   RPR: NON REACTIVE (10/21 0940)  HBsAg: NEGATIVE (02/27 1040)  HIV: NON REACTIVE (02/27 1040)  GBS:   neg  Results for orders placed during the hospital encounter of 06/22/13 (from the past 72  hour(s))  PREPARE RBC (CROSSMATCH)     Status: None   Collection Time    06/22/13 11:30 AM      Result Value Range   Order Confirmation ORDER PROCESSED BY BLOOD BANK      Assessment/Plan: Term IUP 39 weeks Multiple prior cesarean section with desire for repeat cesarean section Obesity Hyperglycemia, fasting, improved with glyburide 5 risk for postpartum hemorrhage  Recommendations: Discussion is held with the patient concerning her decision to proceed with repeat cesarean section. The risks of anesthesia, bleeding, infection,  and damage to adjacent organs have been reviewed. The increased risk of postpartum hemorrhage and desire for typed and crossmatched blood have been discussed. The patient wishes to proceed.   Teresa Perez P 06/22/2013, 11:46 AM

## 2013-06-22 NOTE — Preoperative (Signed)
Beta Blockers   Reason not to administer Beta Blockers:Not Applicable 

## 2013-06-22 NOTE — Anesthesia Preprocedure Evaluation (Signed)
Anesthesia Evaluation  Patient identified by MRN, date of birth, ID band Patient awake    Reviewed: Allergy & Precautions, H&P , NPO status , Patient's Chart, lab work & pertinent test results, reviewed documented beta blocker date and time   History of Anesthesia Complications (+) Family history of anesthesia reaction and history of anesthetic complications (son had "lockjaw" (masseter spasm?) after tonsillectomy, pt reports she was put to sleep during one c/s  for a one-sided epidural)  Airway Mallampati: II TM Distance: >3 FB Neck ROM: full    Dental  (+) Teeth Intact and Missing   Pulmonary Current Smoker (1/2 ppd),  breath sounds clear to auscultation        Cardiovascular Rhythm:regular Rate:Normal     Neuro/Psych  Headaches (h/o migraines - none recently), PSYCHIATRIC DISORDERS (h/o anxiety, depression)    GI/Hepatic negative GI ROS, Neg liver ROS,   Endo/Other  Morbid obesityPCOS Per pt on glyburide for elevated fasting blood sugars for the past week - reports she does not have GDM  Renal/GU negative Renal ROS     Musculoskeletal   Abdominal   Peds  Hematology negative hematology ROS (+)   Anesthesia Other Findings   Reproductive/Obstetrics (+) Pregnancy (h/o c/s x3, for repeat c/s)                           Anesthesia Physical Anesthesia Plan  ASA: III  Anesthesia Plan: Epidural   Post-op Pain Management:    Induction:   Airway Management Planned:   Additional Equipment:   Intra-op Plan:   Post-operative Plan:   Informed Consent: I have reviewed the patients History and Physical, chart, labs and discussed the procedure including the risks, benefits and alternatives for the proposed anesthesia with the patient or authorized representative who has indicated his/her understanding and acceptance.     Plan Discussed with: Surgeon and CRNA  Anesthesia Plan Comments:          Anesthesia Quick Evaluation

## 2013-06-23 ENCOUNTER — Encounter (HOSPITAL_COMMUNITY): Payer: Self-pay | Admitting: Obstetrics and Gynecology

## 2013-06-23 LAB — GLUCOSE, CAPILLARY: Glucose-Capillary: 100 mg/dL — ABNORMAL HIGH (ref 70–99)

## 2013-06-23 LAB — BIRTH TISSUE RECOVERY COLLECTION (PLACENTA DONATION)

## 2013-06-23 LAB — CBC
HCT: 30 % — ABNORMAL LOW (ref 36.0–46.0)
Hemoglobin: 9.8 g/dL — ABNORMAL LOW (ref 12.0–15.0)
MCH: 24.9 pg — ABNORMAL LOW (ref 26.0–34.0)
MCV: 76.3 fL — ABNORMAL LOW (ref 78.0–100.0)
Platelets: 264 10*3/uL (ref 150–400)
RBC: 3.93 MIL/uL (ref 3.87–5.11)
RDW: 15.5 % (ref 11.5–15.5)
WBC: 15.2 10*3/uL — ABNORMAL HIGH (ref 4.0–10.5)

## 2013-06-23 MED ORDER — POLYSACCHARIDE IRON COMPLEX 150 MG PO CAPS
150.0000 mg | ORAL_CAPSULE | Freq: Two times a day (BID) | ORAL | Status: DC
  Administered 2013-06-23 – 2013-06-24 (×2): 150 mg via ORAL
  Filled 2013-06-23 (×5): qty 1

## 2013-06-23 NOTE — Progress Notes (Signed)
Subjective: Postpartum Day 1: Repeat Cesarean Delivery Patient up ad lib, reports no HA, syncope or dizziness. No flatus yet but pt feels she will be able to as she gets up more today.  Pain well managed. Feeding:  Breastfeeding Contraceptive plan:  Paragaurd  Objective: Vital signs in last 24 hours: Temp:  [97.4 F (36.3 C)-98.3 F (36.8 C)] 98.1 F (36.7 C) (10/24 0400) Pulse Rate:  [56-80] 67 (10/24 0400) Resp:  [15-28] 18 (10/24 0400) BP: (106-142)/(57-93) 106/70 mmHg (10/24 0400) SpO2:  [97 %-100 %] 98 % (10/24 0400) Weight:  [258 lb (117.028 kg)] 258 lb (117.028 kg) (10/23 1441)  Physical Exam:  General: alert, cooperative, fatigued and no distress Lochia: appropriate Uterine Fundus: firm Incision: healing well DVT Evaluation: No evidence of DVT seen on physical exam. Negative Homan's sign. JP drain:   n/a   Recent Labs  06/20/13 0940 06/23/13 0550  HGB 11.1* 9.8*  HCT 34.0* 30.0*    Assessment/Plan: Status post Cesarean section day 1. Anemic - asymptomatic Overall, Doing well postoperatively.   Rx Fe Continue current care. Plan for discharge tomorrow    Teresa Perez 06/23/2013, 7:42 AM

## 2013-06-23 NOTE — Anesthesia Postprocedure Evaluation (Signed)
  Anesthesia Post-op Note  Patient: Teresa Perez  Procedure(s) Performed: Procedure(s): CESAREAN SECTION, repeat (N/A)  Patient Location: Mother/Baby  Anesthesia Type:Epidural  Level of Consciousness: awake and alert   Airway and Oxygen Therapy: Patient Spontanous Breathing  Post-op Pain: mild  Post-op Assessment: Patient's Cardiovascular Status Stable, Respiratory Function Stable, No signs of Nausea or vomiting, No headache, No residual numbness and No residual motor weakness  Post-op Vital Signs: Stable  Complications: No apparent anesthesia complications

## 2013-06-24 DIAGNOSIS — D649 Anemia, unspecified: Secondary | ICD-10-CM | POA: Diagnosis not present

## 2013-06-24 LAB — TYPE AND SCREEN
Unit division: 0
Unit division: 0

## 2013-06-24 LAB — GLUCOSE, CAPILLARY: Glucose-Capillary: 111 mg/dL — ABNORMAL HIGH (ref 70–99)

## 2013-06-24 MED ORDER — IBUPROFEN 600 MG PO TABS: 600.0000 mg | ORAL_TABLET | Freq: Four times a day (QID) | ORAL | Status: DC | PRN

## 2013-06-24 MED ORDER — OXYCODONE-ACETAMINOPHEN 5-325 MG PO TABS: 1.0000 | ORAL_TABLET | ORAL | Status: DC | PRN

## 2013-06-24 NOTE — Discharge Summary (Signed)
Cesarean Section Delivery Discharge Summary  Teresa Perez  DOB:    05/18/1976 MRN:    [id number] CSN:    [id number]  Date of admission:                  06/22/13  Date of discharge:                  06/24/13         Procedures this admission:  Date of Delivery: 06/22/13  Newborn Data:  Live born female  Birth Weight: 6 lb 8.1 oz (2950 g) APGAR: 9, 9  Home with mother.   History of Present Illness:  [name] is a 37 y.o. female, [id number], who presents at [redacted]w[redacted]d weeks gestation. The patient has been followed at the [facility].    Her pregnancy has been complicated by:  Patient Active Problem List   Diagnosis Date Noted  . Anemia 06/24/2013  . Cesarean delivery delivered 06/23/2013  . Cigarette smoker within last 12 months 06/22/2013  . Severe obesity (BMI >= 40) 06/22/2013  . Advanced maternal age (AMA) in pregnancy 06/22/2013  . Fasting hyperglycemia 06/22/2013  . Reactive airway disease that is not asthma 06/22/2013  . Unplanned pregnancy 11/10/2012  . Previous cesarean section 07/03/2011    Perez course:  The patient was admitted for repeat cesarean delivery.   She declined BTL and is planning [facility] pp.  She had a dx of gestational diabetes, on oral med.  Her postpartum course was not complicated. She was discharged to home on postpartum day 2 doing well.  Feeding:  breast  Contraception:  IUD  Discharge hemoglobin:  Hemoglobin  Date Value Range Status  06/23/2013 9.8* 12.0 - 15.0 g/dL Final     HCT  Date Value Range Status  06/23/2013 30.0* 36.0 - 46.0 % Final    Discharge Physical Exam:   General: alert Lochia: appropriate Uterine Fundus: firm Incision: Honeycomb dressing CDI DVT Evaluation: No evidence of DVT seen on physical exam. Negative Homan's sign.  Intrapartum Procedures: cesarean: low cervical, transverse, repeat Postpartum Procedures:  none Complications-Operative and Postpartum: none  Discharge Diagnoses: Term Pregnancy-delivered, previous C/S x 3, desired repeat, mild anemia without hemodynamic compromise.  Discharge Information:  Activity:           Per [facility] handout Diet:                routine Medications: Ibuprofen and Percocet, Fe Condition:      stable Instructions:  Care After Cesarean Delivery  Refer to this sheet in the next few weeks. These instructions provide you with information on caring for yourself after your procedure. Your caregiver may also give you specific instructions. Your treatment has been planned according to current medical practices, but problems sometimes occur. Call your caregiver if you have any problems or questions after you go home. HOME CARE INSTRUCTIONS  Only take over-the-counter or prescription medicines as directed by your caregiver.  Do not drink alcohol, especially if you are breastfeeding or taking medicine to relieve pain.  Do not chew or smoke tobacco.  Continue to use good perineal care. Good perineal care includes:  Wiping your perineum from front to back.  Keeping your perineum clean.  Check your cut (incision) daily for increased redness, drainage, swelling, or separation of skin.  Clean your incision gently with soap and water every day, and then pat it dry. If your caregiver says it is  okay, leave the incision uncovered. Use a bandage (dressing) if the incision is draining fluid or appears irritated. If the adhesive strips across the incision do not fall off within 7 days, carefully peel them off.  Hug a pillow when coughing or sneezing until your incision is healed. This helps to relieve pain.  Do not use tampons or douche until your caregiver says it is okay.  Shower, wash your hair, and take tub baths as directed by your caregiver.  Wear a well-fitting bra that provides breast support.  Limit wearing support panties or control-top hose.  Drink enough  fluids to keep your urine clear or pale yellow.  Eat high-fiber foods such as whole grain cereals and breads, brown rice, beans, and fresh fruits and vegetables every day. These foods may help prevent or relieve constipation.  Resume activities such as climbing stairs, driving, lifting, exercising, or traveling as directed by your caregiver.  Talk to your caregiver about resuming sexual activities. This is dependent upon your risk of infection, your rate of healing, and your comfort and desire to resume sexual activity.  Try to have someone help you with your household activities and your newborn for at least a few days after you leave the Perez.  Rest as much as possible. Try to rest or take a nap when your newborn is sleeping.  Increase your activities gradually.  Keep all of your scheduled postpartum appointments. It is very important to keep your scheduled follow-up appointments. At these appointments, your caregiver will be checking to make sure that you are healing physically and emotionally. SEEK MEDICAL CARE IF:   You are passing large clots from your vagina. Save any clots to show your caregiver.  You have a foul smelling discharge from your vagina.  You have trouble urinating.  You are urinating frequently.  You have pain when you urinate.  You have a change in your bowel movements.  You have increasing redness, pain, or swelling near your incision.  You have pus draining from your incision.  Your incision is separating.  You have painful, hard, or reddened breasts.  You have a severe headache.  You have blurred vision or see spots.  You feel sad or depressed.  You have thoughts of hurting yourself or your newborn.  You have questions about your care, the care of your newborn, or medicines.  You are dizzy or lightheaded.  You have a rash.  You have pain, redness, or swelling at the site of the removed intravenous access (IV) tube.  You have nausea or  vomiting.  You stopped breastfeeding and have not had a menstrual period within 12 weeks of stopping.  You are not breastfeeding and have not had a menstrual period within 12 weeks of delivery.  You have a fever. SEEK IMMEDIATE MEDICAL CARE IF:  You have persistent pain.  You have chest pain.  You have shortness of breath.  You faint.  You have leg pain.  You have stomach pain.  Your vaginal bleeding saturates 2 or more sanitary pads in 1 hour. MAKE SURE YOU:   Understand these instructions.  Will watch your condition.  Will get help right away if you are not doing well or get worse. Document Released: 05/09/2002 Document Revised: 05/11/2012 Document Reviewed: 04/13/2012 [facility] Patient Information 2014 [place], [place].   Postpartum Depression and Baby Blues  The postpartum period begins right after the birth of a baby. During this time, there is often a great amount of joy and excitement.  It is also a time of considerable changes in the life of the parent(s). Regardless of how many times a mother gives birth, each child brings new challenges and dynamics to the family. It is not unusual to have feelings of excitement accompanied by confusing shifts in moods, emotions, and thoughts. All mothers are at risk of developing postpartum depression or the "baby blues." These mood changes can occur right after giving birth, or they may occur many months after giving birth. The baby blues or postpartum depression can be mild or severe. Additionally, postpartum depression can resolve rather quickly, or it can be a long-term condition. CAUSES Elevated hormones and their rapid decline are thought to be a main cause of postpartum depression and the baby blues. There are a number of hormones that radically change during and after pregnancy. Estrogen and progesterone usually decrease immediately after delivering your baby. The level of thyroid hormone and various cortisol steroids also rapidly  drop. Other factors that play a major role in these changes include major life events and genetics.  RISK FACTORS If you have any of the following risks for the baby blues or postpartum depression, know what symptoms to watch out for during the postpartum period. Risk factors that may increase the likelihood of getting the baby blues or postpartum depression include:  Havinga personal or family history of depression.  Having depression while being pregnant.  Having premenstrual or oral contraceptive-associated mood issues.  Having exceptional life stress.  Having marital conflict.  Lacking a social support network.  Having a baby with special needs.  Having health problems such as diabetes. SYMPTOMS Baby blues symptoms include:  Brief fluctuations in mood, such as going from extreme happiness to sadness.  Decreased concentration.  Difficulty sleeping.  Crying spells, tearfulness.  Irritability.  Anxiety. Postpartum depression symptoms typically begin within the first month after giving birth. These symptoms include:  Difficulty sleeping or excessive sleepiness.  Marked weight loss.  Agitation.  Feelings of worthlessness.  Lack of interest in activity or food. Postpartum psychosis is a very concerning condition and can be dangerous. Fortunately, it is rare. Displaying any of the following symptoms is cause for immediate medical attention. Postpartum psychosis symptoms include:  Hallucinations and delusions.  Bizarre or disorganized behavior.  Confusion or disorientation. DIAGNOSIS  A diagnosis is made by an evaluation of your symptoms. There are no medical or lab tests that lead to a diagnosis, but there are various questionnaires that a caregiver may use to identify those with the baby blues, postpartum depression, or psychosis. Often times, a screening tool called the [place] Postnatal Depression Scale is used to diagnose depression in the postpartum period.   TREATMENT The baby blues usually goes away on its own in 1 to 2 weeks. Social support is often all that is needed. You should be encouraged to get adequate sleep and rest. Occasionally, you may be given medicines to help you sleep.  Postpartum depression requires treatment as it can last several months or longer if it is not treated. Treatment may include individual or group therapy, medicine, or both to address any social, physiological, and psychological factors that may play a role in the depression. Regular exercise, a healthy diet, rest, and social support may also be strongly recommended.  Postpartum psychosis is more serious and needs treatment right away. Hospitalization is often needed. HOME CARE INSTRUCTIONS  Get as much rest as you can. Nap when the baby sleeps.  Exercise regularly. Some women find yoga and walking to  be beneficial.  Eat a balanced and nourishing diet.  Do little things that you enjoy. Have a cup of tea, take a bubble bath, read your favorite magazine, or listen to your favorite music.  Avoid alcohol.  Ask for help with household chores, cooking, grocery shopping, or running errands as needed. Do not try to do everything.  Talk to people close to you about how you are feeling. Get support from your partner, family members, friends, or other new moms.  Try to stay positive in how you think. Think about the things you are grateful for.  Do not spend a lot of time alone.  Only take medicine as directed by your caregiver.  Keep all your postpartum appointments.  Let your caregiver know if you have any concerns. SEEK MEDICAL CARE IF: You are having a reaction or problems with your medicine. SEEK IMMEDIATE MEDICAL CARE IF:  You have suicidal feelings.  You feel you may harm the baby or someone else. Document Released: 05/21/2004 Document Revised: 11/09/2011 Document Reviewed: 06/23/2011 [facility] Patient Information 2014 [place], [place].  Discharge to:  home  Follow-up Information   Follow up with [facility]. Schedule an appointment as soon as possible for a visit in 6 weeks. (Call for any questions or concerns.)    Specialty:  Obstetrics and Gynecology   Contact information:   [address] [place] [address] [phone]       Nigel Bridgeman 06/24/2013

## 2013-06-24 NOTE — Progress Notes (Signed)
PROGRESS NOTE  I have reviewed the patient's vital signs, labs, and notes. I agree with the previous note from the Certified Nurse Midwife.  Eivan Gallina Vernon Annarose Ouellet, M.D. 06/24/2013  

## 2013-06-27 ENCOUNTER — Encounter (HOSPITAL_COMMUNITY): Payer: Self-pay | Admitting: *Deleted

## 2013-07-06 ENCOUNTER — Other Ambulatory Visit: Payer: Self-pay

## 2013-12-19 ENCOUNTER — Ambulatory Visit: Payer: BC Managed Care – PPO | Admitting: Family

## 2014-01-02 ENCOUNTER — Ambulatory Visit (INDEPENDENT_AMBULATORY_CARE_PROVIDER_SITE_OTHER): Payer: BC Managed Care – PPO | Admitting: Internal Medicine

## 2014-01-02 ENCOUNTER — Encounter: Payer: Self-pay | Admitting: Internal Medicine

## 2014-01-02 VITALS — BP 138/94 | HR 75 | Temp 98.1°F | Resp 16 | Ht 66.5 in | Wt 253.0 lb

## 2014-01-02 DIAGNOSIS — F329 Major depressive disorder, single episode, unspecified: Secondary | ICD-10-CM

## 2014-01-02 DIAGNOSIS — F3289 Other specified depressive episodes: Secondary | ICD-10-CM

## 2014-01-02 DIAGNOSIS — O24419 Gestational diabetes mellitus in pregnancy, unspecified control: Secondary | ICD-10-CM

## 2014-01-02 DIAGNOSIS — F17200 Nicotine dependence, unspecified, uncomplicated: Secondary | ICD-10-CM

## 2014-01-02 DIAGNOSIS — E282 Polycystic ovarian syndrome: Secondary | ICD-10-CM

## 2014-01-02 DIAGNOSIS — O99810 Abnormal glucose complicating pregnancy: Secondary | ICD-10-CM

## 2014-01-02 DIAGNOSIS — F32A Depression, unspecified: Secondary | ICD-10-CM

## 2014-01-02 DIAGNOSIS — G4700 Insomnia, unspecified: Secondary | ICD-10-CM

## 2014-01-02 DIAGNOSIS — Z720 Tobacco use: Secondary | ICD-10-CM

## 2014-01-02 LAB — CBC WITH DIFFERENTIAL/PLATELET
BASOS PCT: 0 % (ref 0–1)
Basophils Absolute: 0 10*3/uL (ref 0.0–0.1)
EOS ABS: 0.4 10*3/uL (ref 0.0–0.7)
Eosinophils Relative: 4 % (ref 0–5)
HCT: 35.2 % — ABNORMAL LOW (ref 36.0–46.0)
Hemoglobin: 11.2 g/dL — ABNORMAL LOW (ref 12.0–15.0)
Lymphocytes Relative: 25 % (ref 12–46)
Lymphs Abs: 2.5 10*3/uL (ref 0.7–4.0)
MCH: 23.2 pg — ABNORMAL LOW (ref 26.0–34.0)
MCHC: 31.8 g/dL (ref 30.0–36.0)
MCV: 73 fL — ABNORMAL LOW (ref 78.0–100.0)
MONO ABS: 0.6 10*3/uL (ref 0.1–1.0)
Monocytes Relative: 6 % (ref 3–12)
NEUTROS PCT: 65 % (ref 43–77)
Neutro Abs: 6.5 10*3/uL (ref 1.7–7.7)
Platelets: 398 10*3/uL (ref 150–400)
RBC: 4.82 MIL/uL (ref 3.87–5.11)
RDW: 14.4 % (ref 11.5–15.5)
WBC: 10 10*3/uL (ref 4.0–10.5)

## 2014-01-02 LAB — COMPREHENSIVE METABOLIC PANEL
ALBUMIN: 3.9 g/dL (ref 3.5–5.2)
ALT: 11 U/L (ref 0–35)
AST: 14 U/L (ref 0–37)
Alkaline Phosphatase: 63 U/L (ref 39–117)
BILIRUBIN TOTAL: 0.5 mg/dL (ref 0.2–1.2)
BUN: 11 mg/dL (ref 6–23)
CO2: 26 mEq/L (ref 19–32)
Calcium: 9.4 mg/dL (ref 8.4–10.5)
Chloride: 106 mEq/L (ref 96–112)
Creat: 0.81 mg/dL (ref 0.50–1.10)
Glucose, Bld: 78 mg/dL (ref 70–99)
POTASSIUM: 4 meq/L (ref 3.5–5.3)
SODIUM: 140 meq/L (ref 135–145)
Total Protein: 7.1 g/dL (ref 6.0–8.3)

## 2014-01-02 LAB — TSH: TSH: 2.227 u[IU]/mL (ref 0.350–4.500)

## 2014-01-02 LAB — LIPID PANEL
CHOLESTEROL: 158 mg/dL (ref 0–200)
HDL: 46 mg/dL (ref >39)
LDL Cholesterol: 95 mg/dL (ref 0–99)
Total CHOL/HDL Ratio: 3.4 Ratio
Triglycerides: 83 mg/dL (ref <150)
VLDL: 17 mg/dL (ref 0–40)

## 2014-01-02 LAB — HEMOGLOBIN A1C
Hgb A1c MFr Bld: 5.9 % — ABNORMAL HIGH (ref <5.7)
Mean Plasma Glucose: 123 mg/dL — ABNORMAL HIGH (ref <117)

## 2014-01-02 MED ORDER — SERTRALINE HCL 50 MG PO TABS: ORAL_TABLET | ORAL | Status: DC

## 2014-01-02 MED ORDER — HYDROCODONE-ACETAMINOPHEN 2.5-325 MG PO TABS: ORAL_TABLET | ORAL | Status: DC

## 2014-01-02 MED ORDER — LORAZEPAM 1 MG PO TABS: ORAL_TABLET | ORAL | Status: DC

## 2014-01-02 MED ORDER — METFORMIN HCL ER 500 MG PO TB24: ORAL_TABLET | ORAL | Status: DC

## 2014-01-02 NOTE — Patient Instructions (Signed)
See me in 4-5 weeks  30 mins  Therapist  Levy Pupa  :  [phone]  Vanetta Mulders :  [phone]  Take Ativan nightly for 3 night then take 3 times a week  To lab today

## 2014-01-02 NOTE — Progress Notes (Signed)
Subjective:    Patient ID: [facility], female    DOB: 02/01/1976, 38 y.o.   MRN: [id number]  HPI New pt here for first visit for primary care.     She works at [employer] and has been back to [facility] from [place] for the past 2-3 years   PMH Depression (formerly on Xanax, Zoloft, Effexor in past),   PCOS  (formerly on Metformin),  HTN during pregnancy and Gestational Diabetes, and current Tobacco use.      Depression  She was first diagnosed around age 35.  Never been hospitalized.  Very teary in office today.  She has been off her meds for th epast 5-6 years. Lots of stressors now.  Single mother recent break up with boyfriend.  Has 5 children with a 60 month old and works outside her home.  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  Daily depression for last 2 months.  No Suicidal ideation, intent or plan,  No psychiotic features.   Able to on concentrate at work  "wok helps" does like her job.  She has difficulty initiating sleep andwakes up 2-3 times per night.  She did not like effexor but Zoloft OK  She has tried mutliple times to come of cigarettes but is not interested now    Allergies  Allergen Reactions  . Latex Rash   Past Medical History  Diagnosis Date  . Hypertension     pt states she was treated with hctz for pre-htn for approx 53mo-has not taken since 07/2010  . Asthma     childhood asthma  . Anxiety   . Depression     has been treated in past-no present meds  . PCOS (polycystic ovarian syndrome)   . Boil     tendency to form boils  . Hx of migraine headaches   . Headache(784.0)   . Family history of anesthesia complication 23435   son- age 39- "had lockjaw" after surgery (T&A)   Past Surgical History  Procedure Laterality Date  . Cesarean section      x2 previous  . Mouth surgery  2004  . Cesarean section  07/03/2011    Procedure: CESAREAN SECTION;  Surgeon: SAlwyn Pea MD;  [facility];  Service: Gynecology;  Laterality: N/A;  . Cesarean section N/A 06/22/2013    Procedure: CESAREAN SECTION, repeat;  Surgeon: VEldred Manges MD;  [facility];  Service: Obstetrics;  Laterality: N/A;   History   Social History  . Marital Status: Single    Spouse Name: N/A    Number of Children: N/A  . Years of Education: N/A   Occupational History  . Not on file.   Social History Main Topics  . Smoking status: Current Some Day Smoker -- 0.25 packs/day for 16 years    Types: Cigarettes  . Smokeless tobacco: Never Used  . Alcohol Use: No     Comment: occasional   . Drug Use: No  . Sexual Activity: Yes    Birth Control/ Protection: None   Other Topics Concern  . Not on file   Social History Narrative  . No narrative on file   Family History  Problem Relation Age of Onset  . Anesthesia problems Son     son-had T&A-2008-had trismus  . Diabetes Mother   . Hypertension Mother   . Hyperlipidemia Mother   . Arthritis  Mother   . Hypertension Father   . Hypertension Paternal Grandmother   . Hyperlipidemia Paternal Grandmother   . Depression Neg Hx    Patient Active Problem List   Diagnosis Date Noted  . Anemia 06/24/2013  . Cesarean delivery delivered 06/23/2013  . Cigarette smoker within last 12 months 06/22/2013  . Severe obesity (BMI >= 40) 06/22/2013  . Advanced maternal age (AMA) in pregnancy 06/22/2013  . Fasting hyperglycemia 06/22/2013  . Reactive airway disease that is not asthma 06/22/2013  . Unplanned pregnancy 11/10/2012  . Previous cesarean section 07/03/2011   Current Outpatient Prescriptions on File Prior to Visit  Medication Sig Dispense Refill  . ibuprofen (ADVIL,MOTRIN) 600 MG tablet Take 1 tablet (600 mg total) by mouth every 6 (six) hours as needed for pain.  36 tablet  2   No current facility-administered medications on file prior to visit.       Review of Systems See HPI    Objective:   Physical Exam  Physical Exam    Nursing note and vitals reviewed.   Repeat BP  134/88 Constitutional: She is oriented to person, place, and time. She appears well-developed and well-nourished.  HENT:  Head: Normocephalic and atraumatic.  Cardiovascular: Normal rate and regular rhythm. Exam reveals no gallop and no friction rub.  No murmur heard.  Pulmonary/Chest: Breath sounds normal. She has no wheezes. She has no rales.  Neurological: She is alert and oriented to person, place, and time.  Skin: Skin is warm and dry.  Psychiatric: Very tearful today.         Assessment & Plan:  Depression:    Will give Zoloft 50 mg for one week then increase to 100 mg daily . I gave number to two therapists,  Vanetta Mulders and Levy Pupa for pt to initiate talking therapy   Insomnia related to above.  Ok for ativan 0.5 or 8m nightly for 3 nights then decrease to 3 times per week  PCOS  Will resume Metformin  XR 500 mg daily  HtN,Diabetes in pregnancy  Will moniter for this.  BP normal range to day  All labs with TSH today,  See me in 4-5 weeks

## 2014-01-03 DIAGNOSIS — G4700 Insomnia, unspecified: Secondary | ICD-10-CM | POA: Insufficient documentation

## 2014-01-03 LAB — VITAMIN D 25 HYDROXY (VIT D DEFICIENCY, FRACTURES): VIT D 25 HYDROXY: 19 ng/mL — AB (ref 30–89)

## 2014-01-09 ENCOUNTER — Encounter: Payer: Self-pay | Admitting: Internal Medicine

## 2014-01-09 DIAGNOSIS — E559 Vitamin D deficiency, unspecified: Secondary | ICD-10-CM | POA: Insufficient documentation

## 2014-02-05 ENCOUNTER — Encounter: Payer: BC Managed Care – PPO | Admitting: Internal Medicine

## 2014-02-05 NOTE — Progress Notes (Signed)
Subjective:    Patient ID: [facility], female    DOB: [birth year], 38 y.o.   MRN: [id number]  HPI  Teresa [facility] is here for follow up  of depression and insomnia related to depression   Allergies  Allergen Reactions  . Latex Rash   Past Medical History  Diagnosis Date  . Hypertension     pt states she was treated with hctz for pre-htn for approx 37mo-has not taken since 07/2010  . Asthma     childhood asthma  . Anxiety   . Depression     has been treated in past-no present meds  . PCOS (polycystic ovarian syndrome)   . Boil     tendency to form boils  . Hx of migraine headaches   . Headache(784.0)   . Family history of anesthesia complication 26734   son- age 39- "had lockjaw" after surgery (T&A)   Past Surgical History  Procedure Laterality Date  . Cesarean section      x2 previous  . Mouth surgery  2004  . Cesarean section  07/03/2011    Procedure: CESAREAN SECTION;  Surgeon: SAlwyn Pea MD;  [facility];  Service: Gynecology;  Laterality: N/A;  . Cesarean section N/A 06/22/2013    Procedure: CESAREAN SECTION, repeat;  Surgeon: VEldred Manges MD;  [facility];  Service: Obstetrics;  Laterality: N/A;   History   Social History  . Marital Status: Single    Spouse Name: N/A    Number of Children: N/A  . Years of Education: N/A   Occupational History  . Not on file.   Social History Main Topics  . Smoking status: Current Some Day Smoker -- 0.25 packs/day for 16 years    Types: Cigarettes  . Smokeless tobacco: Never Used  . Alcohol Use: No     Comment: occasional   . Drug Use: No  . Sexual Activity: Yes    Birth Control/ Protection: None   Other Topics Concern  . Not on file   Social History Narrative  . No narrative on file   Family History  Problem Relation Age of Onset  . Anesthesia problems Son     son-had T&A-2008-had trismus  . Diabetes Mother   . Hypertension Mother   . Hyperlipidemia Mother   . Arthritis Mother   .  Hypertension Father   . Hypertension Paternal Grandmother   . Hyperlipidemia Paternal Grandmother   . Depression Neg Hx    Patient Active Problem List   Diagnosis Date Noted  . Unspecified vitamin D deficiency 01/09/2014  . Insomnia 01/03/2014  . Depression 01/02/2014  . PCOS (polycystic ovarian syndrome) 01/02/2014  . Anemia 06/24/2013  . Cesarean delivery delivered 06/23/2013  . Cigarette smoker within last 12 months 06/22/2013  . Severe obesity (BMI >= 40) 06/22/2013  . Advanced maternal age (AMA) in pregnancy 06/22/2013  . Fasting hyperglycemia 06/22/2013  . Reactive airway disease that is not asthma 06/22/2013  . Unplanned pregnancy 11/10/2012  . Previous cesarean section 07/03/2011   Current Outpatient Prescriptions on File Prior to Visit  Medication Sig Dispense Refill  . Hydrocodone-Acetaminophen 2.5-325 MG TABS Take one tablet every 6 hours as needed for pain  30 tablet  0  . ibuprofen (ADVIL,MOTRIN) 600 MG tablet Take 1 tablet (600 mg total) by mouth every 6 (six) hours as needed for pain.  36 tablet  2  . LORazepam (ATIVAN) 1 MG tablet Take one tablet 3 times a week for sleep  30 tablet  1  . metFORMIN (GLUCOPHAGE XR) 500 MG 24 hr tablet Take one tablet with breakfast  Generic Ok  30 tablet  2  . sertraline (ZOLOFT) 50 MG tablet Take one tablet at night with food for 7 days then 2 tablets nightly with food  30 tablet  3   No current facility-administered medications on file prior to visit.      Review of Systems See HPI    Objective:   Physical Exam  Physical Exam  Nursing note and vitals reviewed.  Constitutional: She is oriented to person, place, and time. She appears well-developed and well-nourished.  HENT:  Head: Normocephalic and atraumatic.  Cardiovascular: Normal rate and regular rhythm. Exam reveals no gallop and no friction rub.  No murmur heard.  Pulmonary/Chest: Breath sounds normal. She has no wheezes. She has no rales.  Neurological: She is  alert and oriented to person, place, and time.  Skin: Skin is warm and dry.  Psychiatric: She has a normal mood and affect. Her behavior is normal.             Assessment & Plan:

## 2014-02-25 NOTE — Progress Notes (Signed)
   Subjective:    Patient ID: [facility], female    DOB: 1976/03/03, 38 y.o.   MRN: [id number]  HPI Laren is here for follow up  Depression/Insomnia    She was restarted on Zoloft and Ativan for sleep  Feeling much better .  Sleeping better  Mood birghter  No S/H ideation.   Anemia  Has DUB that is being treated by her GYN  Etiology ??  Pt states possible endometriosis  Her TSH is normal.   She has been on Fusion in the past but has not been taking this  Vitamin D deficiency   See labs    She also describes intermmittant  "crunching " chest pain  That lasts 20-30 secs.  She can trace this back to the age of 7 when it first started.  Location L side of chest  No radiation, no SOB  , no diaphoresis no N/V.    Not exertional   Review of Systems See HPI    Objective:   Physical Exam Physical Exam  Nursing note and vitals reviewed.  Constitutional: She is oriented to person, place, and time. She appears well-developed and well-nourished.  HENT:  Head: Normocephalic and atraumatic.  Cardiovascular: Normal rate and regular rhythm. Exam reveals no gallop and no friction rub.  No murmur heard.  Pulmonary/Chest: Breath sounds normal. She has no wheezes. She has no rales.  Neurological: She is alert and oriented to person, place, and time.  Skin: Skin is warm and dry.  Psychiatric: She has a normal mood and affect. Her behavior is normal.           Assessment & Plan:  Depression  Continue low dose Zoloft  Insomnia  Related to above.  [place] for Ativan use up to 3 nights per week.  I advised to use rarely and not nightly    Atypical  chest pain  EKG  No acute findings will get CXR  Mild anemia  Will order integra daily  Vitamin D deficiency :  Will give 50,000 units weekly for 8 weeks then 1000 units daily   See me for CPE

## 2014-02-26 ENCOUNTER — Ambulatory Visit (HOSPITAL_BASED_OUTPATIENT_CLINIC_OR_DEPARTMENT_OTHER)
Admission: RE | Admit: 2014-02-26 | Discharge: 2014-02-26 | Disposition: A | Discharge status: Home / Self Care | Payer: BC Managed Care – PPO | Source: Ambulatory Visit | Attending: Internal Medicine | Admitting: Internal Medicine

## 2014-02-26 ENCOUNTER — Ambulatory Visit (INDEPENDENT_AMBULATORY_CARE_PROVIDER_SITE_OTHER): Payer: BC Managed Care – PPO | Admitting: Internal Medicine

## 2014-02-26 ENCOUNTER — Encounter: Payer: Self-pay | Admitting: Internal Medicine

## 2014-02-26 ENCOUNTER — Telehealth: Payer: Self-pay | Admitting: *Deleted

## 2014-02-26 VITALS — Ht 66.5 in | Wt 251.0 lb

## 2014-02-26 DIAGNOSIS — D649 Anemia, unspecified: Secondary | ICD-10-CM

## 2014-02-26 DIAGNOSIS — R079 Chest pain, unspecified: Secondary | ICD-10-CM

## 2014-02-26 DIAGNOSIS — Z139 Encounter for screening, unspecified: Secondary | ICD-10-CM

## 2014-02-26 DIAGNOSIS — E559 Vitamin D deficiency, unspecified: Secondary | ICD-10-CM

## 2014-02-26 DIAGNOSIS — G4700 Insomnia, unspecified: Secondary | ICD-10-CM

## 2014-02-26 DIAGNOSIS — R0789 Other chest pain: Secondary | ICD-10-CM | POA: Insufficient documentation

## 2014-02-26 DIAGNOSIS — F3289 Other specified depressive episodes: Secondary | ICD-10-CM

## 2014-02-26 DIAGNOSIS — F329 Major depressive disorder, single episode, unspecified: Secondary | ICD-10-CM

## 2014-02-26 DIAGNOSIS — F32A Depression, unspecified: Secondary | ICD-10-CM

## 2014-02-26 MED ORDER — LORAZEPAM 1 MG PO TABS: ORAL_TABLET | ORAL | Status: DC

## 2014-02-26 MED ORDER — SERTRALINE HCL 50 MG PO TABS: ORAL_TABLET | ORAL | Status: DC

## 2014-02-26 MED ORDER — VITAMIN D (ERGOCALCIFEROL) 1.25 MG (50000 UNIT) PO CAPS: ORAL_CAPSULE | ORAL | Status: DC

## 2014-02-26 MED ORDER — INTEGRA 62.5-62.5-40-3 MG PO CAPS: ORAL_CAPSULE | ORAL | Status: DC

## 2014-02-26 NOTE — Telephone Encounter (Signed)
Called pt cell and home number and both numbers are no good. Called work number and there was no answer.

## 2014-02-26 NOTE — Patient Instructions (Addendum)
To xray  Today  To pharmacy today

## 2014-02-26 NOTE — Telephone Encounter (Signed)
Message copied by Gretchen Short on Mon Feb 26, 2014  2:16 PM ------      Message from: Emi Belfast D      Created: Mon Feb 26, 2014  1:40 PM       Te[facility]             Call pt and let her know that her CXR is normal ------

## 2014-06-19 ENCOUNTER — Encounter: Payer: BC Managed Care – PPO | Admitting: Internal Medicine

## 2014-07-02 ENCOUNTER — Encounter: Payer: Self-pay | Admitting: Internal Medicine

## 2014-11-06 ENCOUNTER — Ambulatory Visit (INDEPENDENT_AMBULATORY_CARE_PROVIDER_SITE_OTHER): Payer: BLUE CROSS/BLUE SHIELD | Admitting: Internal Medicine

## 2014-11-06 ENCOUNTER — Encounter: Payer: Self-pay | Admitting: Internal Medicine

## 2014-11-06 VITALS — BP 133/87 | HR 66 | Resp 16 | Ht 65.5 in | Wt 241.0 lb

## 2014-11-06 DIAGNOSIS — G4700 Insomnia, unspecified: Secondary | ICD-10-CM

## 2014-11-06 DIAGNOSIS — F32A Depression, unspecified: Secondary | ICD-10-CM

## 2014-11-06 DIAGNOSIS — E559 Vitamin D deficiency, unspecified: Secondary | ICD-10-CM

## 2014-11-06 DIAGNOSIS — Z720 Tobacco use: Secondary | ICD-10-CM

## 2014-11-06 DIAGNOSIS — F329 Major depressive disorder, single episode, unspecified: Secondary | ICD-10-CM

## 2014-11-06 DIAGNOSIS — Z0000 Encounter for general adult medical examination without abnormal findings: Secondary | ICD-10-CM | POA: Diagnosis not present

## 2014-11-06 DIAGNOSIS — E282 Polycystic ovarian syndrome: Secondary | ICD-10-CM

## 2014-11-06 LAB — CBC WITH DIFFERENTIAL/PLATELET
BASOS ABS: 0 10*3/uL (ref 0.0–0.1)
Basophils Relative: 0 % (ref 0–1)
Eosinophils Absolute: 0.4 10*3/uL (ref 0.0–0.7)
Eosinophils Relative: 3 % (ref 0–5)
HCT: 35.6 % — ABNORMAL LOW (ref 36.0–46.0)
HEMOGLOBIN: 11 g/dL — AB (ref 12.0–15.0)
Lymphocytes Relative: 32 % (ref 12–46)
Lymphs Abs: 3.7 10*3/uL (ref 0.7–4.0)
MCH: 22.2 pg — ABNORMAL LOW (ref 26.0–34.0)
MCHC: 30.9 g/dL (ref 30.0–36.0)
MCV: 71.9 fL — AB (ref 78.0–100.0)
MPV: 9.3 fL (ref 8.6–12.4)
Monocytes Absolute: 0.8 10*3/uL (ref 0.1–1.0)
Monocytes Relative: 7 % (ref 3–12)
Neutro Abs: 6.8 10*3/uL (ref 1.7–7.7)
Neutrophils Relative %: 58 % (ref 43–77)
PLATELETS: 467 10*3/uL — AB (ref 150–400)
RBC: 4.95 MIL/uL (ref 3.87–5.11)
RDW: 16.5 % — ABNORMAL HIGH (ref 11.5–15.5)
WBC: 11.7 10*3/uL — ABNORMAL HIGH (ref 4.0–10.5)

## 2014-11-06 LAB — POCT URINALYSIS DIPSTICK
Bilirubin, UA: NEGATIVE
Blood, UA: NEGATIVE
GLUCOSE UA: NEGATIVE
Ketones, UA: NEGATIVE
Leukocytes, UA: NEGATIVE
Nitrite, UA: NEGATIVE
Protein, UA: NEGATIVE
SPEC GRAV UA: 1.01
Urobilinogen, UA: NEGATIVE
pH, UA: 6.5

## 2014-11-06 NOTE — Progress Notes (Signed)
Subjective:    Patient ID: Teresa Perez, female    DOB: Oct 21, 1975, 39 y.o.   MRN: [id number]  HPI 01/12/2014 note HPI New pt here for first visit for primary care. She works at [employer] and has been back to [facility] from [place] for the past 2-3 years   PMH Depression (formerly on Xanax, Zoloft, Effexor in past), PCOS (formerly on Metformin), HTN during pregnancy and Gestational Diabetes, and current Tobacco use.    Depression She was first diagnosed around age 48. Never been hospitalized. Very teary in office today. She has been off her meds for th epast 5-6 years. Lots of stressors now. Single mother recent break up with boyfriend. Has 5 children with a 68 month old and works outside her home. 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 Daily depression for last 2 months. No Suicidal ideation, intent or plan, No psychiotic features. Able to on concentrate at work "wok helps" does like her job. She has difficulty initiating sleep andwakes up 2-3 times per night. She did not like effexor but Zoloft OK  She has tried mutliple times to come of cigarettes but is not interested now  Depression: Will give Zoloft 50 mg for one week then increase to 100 mg daily . I gave number to two therapists, Vanetta Mulders and Levy Pupa for pt to initiate talking therapy   Insomnia related to above. Ok for ativan 0.5 or 55m nightly for 3 nights then decrease to 3 times per week  PCOS Will resume Metformin XR 500 mg daily  HtN,Diabetes in pregnancy Will moniter for this. BP normal range to day  All labs with TSH today, See me in 4-5 weeks  TODAY  LCarleneis here for CPE  HM:  Pap Dr. HLeo Grosser  < 19 year smoker     Situational depression  She stopped Zoloft  "didn't like how I felt"   Home stressors better.  One son in [place]    Tobacco use   "trying to cut down"  Not interested in Classes or  RX  Anemia  Pt states she has been having heavy menses  Has been on FE in past.  Managed in past by GYN  Skin rash tibia  She would like  A new dermatolgist     Review of Systems  Respiratory: Negative for cough, choking, chest tightness, shortness of breath and wheezing.   Cardiovascular: Negative for chest pain, palpitations and leg swelling.  All other systems reviewed and are negative.      Objective:   Physical Exam Physical Exam  Nursing note and vitals reviewed.  Constitutional: She is oriented to person, place, and time. She appears well-developed and well-nourished.  HENT:  Head: Normocephalic and atraumatic.  Right Ear: Tympanic membrane and ear canal normal. No drainage. Tympanic membrane is not injected and not erythematous.  Left Ear: Tympanic membrane and ear canal normal. No drainage. Tympanic membrane is not injected and not erythematous.  Nose: Nose normal. Right sinus exhibits no maxillary sinus tenderness and no frontal sinus tenderness. Left sinus exhibits no maxillary sinus tenderness and no frontal sinus tenderness.  Mouth/Throat: Oropharynx is clear and moist. No oral lesions. No oropharyngeal exudate.  Eyes: Conjunctivae and EOM are normal. Pupils are equal, round, and reactive to light.  Neck: Normal range of motion. Neck supple. No JVD present. Carotid bruit is not present. No mass and no thyromegaly present.  Cardiovascular: Normal rate, regular rhythm, S1 normal, S2 normal and intact distal pulses. Exam reveals no gallop and no friction rub.  No murmur heard.  Pulses:  Carotid pulses are 2+ on the right side, and 2+ on the left side.  Dorsalis pedis pulses are 2+ on the right side, and 2+ on the left side.  No carotid bruit. No LE edema  Pulmonary/Chest: Breath sounds normal. She has no wheezes. She has no rales. She exhibits no tenderness.  Abdominal: Soft. Bowel sounds are normal. She exhibits no distension and no mass. There is no hepatosplenomegaly.  There is no tenderness. There is no CVA tenderness.  Musculoskeletal: Normal range of motion.  No active synovitis to joints.  Lymphadenopathy:  She has no cervical adenopathy.  She has no axillary adenopathy.  Right: No inguinal and no supraclavicular adenopathy present.  Left: No inguinal and no supraclavicular adenopathy present.  Neurological: She is alert and oriented to person, place, and time. She has normal strength and normal reflexes. She displays no tremor. No cranial nerve deficit or sensory deficit. Coordination and gait normal.  Skin: Skin is warm and dry. White scaly plagues on tibia near ankle. No cyanosis. Nails show no clubbing.  Psychiatric: She has a normal mood and affect. Her speech is normal and behavior is normal. Cognition and memory are normal.           Assessment & Plan:  HM  Pap per GYN  counsled lung CA screening guideline  UTD with vaccines  Anemia  Will check today  Likely due to heavy menses  May need FE pending results  situtational depression  Of  SSRI now  And doing OK  Tobacco use  Advised cessation  She does not wish class or RX now  PCOS/  Irregular menses   Managed by GYN  Vitamind D def  Will check today    Skin plaques    Gave pt nunmber to Dr. Rosezella [place] and she states she will make her own appt

## 2014-11-07 ENCOUNTER — Encounter: Payer: Self-pay | Admitting: *Deleted

## 2014-11-07 ENCOUNTER — Telehealth: Payer: Self-pay | Admitting: *Deleted

## 2014-11-07 ENCOUNTER — Other Ambulatory Visit: Payer: Self-pay | Admitting: Internal Medicine

## 2014-11-07 LAB — VITAMIN D 25 HYDROXY (VIT D DEFICIENCY, FRACTURES): VIT D 25 HYDROXY: 11 ng/mL — AB (ref 30–100)

## 2014-11-07 MED ORDER — INTEGRA 62.5-62.5-40-3 MG PO CAPS: ORAL_CAPSULE | ORAL | Status: DC

## 2014-11-07 MED ORDER — VITAMIN D (ERGOCALCIFEROL) 1.25 MG (50000 UNIT) PO CAPS: ORAL_CAPSULE | ORAL | Status: DC

## 2014-11-07 NOTE — Telephone Encounter (Signed)
Patient is aware of her lab results 

## 2014-11-07 NOTE — Telephone Encounter (Signed)
-----   Message from Lanice Shirts, MD sent at 11/07/2014 12:46 PM EST ----- Call pt and let her know she is slighty anemic.  Advise to take Integra one daily  (RX sent) and advise her to be sure to make an appt with her GYN  Dr. Leo Grosser  Vitamin D is very low.  Rx sent for 50,000 unnits of D3 once a week for 12 weeks then advise to take 2000 units of Vitamin D OTC    Ok to mail labs to her  Will give to you

## 2014-11-20 ENCOUNTER — Telehealth: Payer: Self-pay | Admitting: *Deleted

## 2014-11-20 NOTE — Telephone Encounter (Signed)
There is no medication that is effective for depression that she can use "as needed"  For depression she needs a pill on a daily basis

## 2014-11-20 NOTE — Telephone Encounter (Signed)
Teresa Perez called and said that she wanted to try something for her depression that she can take as needed. She did not take the Zoloft that was given at her last visit because she does not want to be on something everyday. She says that her depression comes and goes. She missed work this past Friday due to increased depression

## 2014-11-20 NOTE — Telephone Encounter (Signed)
I spoke with Teresa Perez and let her know that Dr .Sharin Mons response. She voiced understanding

## 2014-11-26 ENCOUNTER — Encounter: Payer: Self-pay | Admitting: *Deleted

## 2015-01-14 ENCOUNTER — Other Ambulatory Visit: Payer: Self-pay | Admitting: Internal Medicine

## 2015-01-30 NOTE — Telephone Encounter (Signed)
Teresa Perez responded to patient

## 2015-02-11 ENCOUNTER — Ambulatory Visit (INDEPENDENT_AMBULATORY_CARE_PROVIDER_SITE_OTHER): Payer: BLUE CROSS/BLUE SHIELD | Admitting: Medical

## 2015-02-11 ENCOUNTER — Encounter: Payer: Self-pay | Admitting: Medical

## 2015-02-11 VITALS — BP 124/98 | HR 83 | Temp 100.8°F | Ht 65.5 in | Wt 244.8 lb

## 2015-02-11 DIAGNOSIS — J029 Acute pharyngitis, unspecified: Secondary | ICD-10-CM | POA: Diagnosis not present

## 2015-02-11 DIAGNOSIS — D509 Iron deficiency anemia, unspecified: Secondary | ICD-10-CM | POA: Diagnosis not present

## 2015-02-11 DIAGNOSIS — F329 Major depressive disorder, single episode, unspecified: Secondary | ICD-10-CM | POA: Diagnosis not present

## 2015-02-11 DIAGNOSIS — F32A Depression, unspecified: Secondary | ICD-10-CM

## 2015-02-11 MED ORDER — AZITHROMYCIN 250 MG PO TABS: ORAL_TABLET | ORAL | Status: DC

## 2015-02-11 MED ORDER — CEFTRIAXONE SODIUM 1 G IJ SOLR
1.0000 g | Freq: Once | INTRAMUSCULAR | Status: AC
  Administered 2015-02-11: 1 g via INTRAMUSCULAR

## 2015-02-11 NOTE — Assessment & Plan Note (Signed)
Will check her cbc today.

## 2015-02-11 NOTE — Progress Notes (Signed)
Pre visit review using our clinic review tool, if applicable. No additional management support is needed unless otherwise documented below in the visit note. 

## 2015-02-11 NOTE — Assessment & Plan Note (Signed)
Stable

## 2015-02-11 NOTE — Assessment & Plan Note (Addendum)
Rocephin 1 gram im. Note very large and exudative tonsils. Rx azithromycin antibiotic as well.

## 2015-02-11 NOTE — Progress Notes (Deleted)
Pre visit review using our clinic review tool, if applicable. No additional management support is needed unless otherwise documented below in the visit note. 

## 2015-02-11 NOTE — Progress Notes (Signed)
Subjective:    Patient ID: Teresa Perez, female    DOB: 04-11-1976, 39 y.o.   MRN: [id number]  HPI  I have reviewed pt PMH, PSH, FH, Social History and Surgical History  Pt has history of anemia-pt states on iron. Last cbc checked 3 months. Pt had mild anemia hb 11 hct 35.6. She has not taken iron in last week.  Asthma- only as child no wheezing since then.  Depression- hx of on and off. Last was in 2014 after birth of child.   htn- hx of intermittent htn with prior pregnancy. But not on med recently. With previous MD she states bp was not high.  Hx of PCOS.   Migraine- Last migraine last week and was very slight. None since.   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 Pt does not exercise. Occasional caffeine beverage at most 3 times a week. 5 children 44 yo, 66 yo, 17 yo. 88 yo, and 1.  Pt has IUD.   Pt has sore throat since Saturday. Has fever on and off. Also has some ha. Some body aches. Daughter has ear infection and some swelling of her tonsil.      Review of Systems  Constitutional: Positive for fever. Negative for chills.  HENT: Positive for sore throat. Negative for congestion, ear pain, nosebleeds, postnasal drip, trouble swallowing and voice change.   Respiratory: Negative for cough, choking, shortness of breath and wheezing.   Cardiovascular: Negative for chest pain and palpitations.  Musculoskeletal: Negative for back pain.       Pretibial regoin soreness but no popliteal pain.  Neurological: Positive for headaches. Negative for dizziness, tremors, seizures, syncope, facial asymmetry, speech difficulty, weakness, light-headedness and numbness.       Moderate ha.  Hematological: Positive for adenopathy. Does not bruise/bleed easily.    Past Medical History  Diagnosis Date  . Hypertension     pt states she was treated with hctz for pre-htn for approx 92mo-has not taken since 07/2010  . Asthma     childhood asthma  . Anxiety   . Depression     has been  treated in past-no present meds  . PCOS (polycystic ovarian syndrome)   . Boil     tendency to form boils  . Hx of migraine headaches   . Headache(784.0)   . Family history of anesthesia complication 22683   son- age 544- "had lockjaw" after surgery (T&A)  . Anemia     History   Social History  . Marital Status: Single    Spouse Name: N/A  . Number of Children: N/A  . Years of Education: N/A   Occupational History  . Not on file.   Social History Main Topics  . Smoking status: Current Some Day Smoker -- 0.25 packs/day for 16 years    Types: Cigarettes  . Smokeless tobacco: Never Used  . Alcohol Use: No     Comment: occasional   . Drug Use: No  . Sexual Activity: Yes    Birth Control/ Protection: None   Other Topics Concern  . Not on file   Social History Narrative    Past Surgical History  Procedure Laterality Date  . Cesarean section      x2 previous  . Mouth surgery  2004  . Cesarean section  07/03/2011    Procedure: CESAREAN SECTION;  Surgeon: SAlwyn Pea MD;  [facility];  Service: Gynecology;  Laterality: N/A;  . Cesarean section N/A 06/22/2013  Procedure: CESAREAN SECTION, repeat;  Surgeon: Eldred Manges, MD;  Location: [facility];  Service: Obstetrics;  Laterality: N/A;    Family History  Problem Relation Age of Onset  . Anesthesia problems Son     son-had T&A-2008-had trismus  . Diabetes Mother   . Hypertension Mother   . Hyperlipidemia Mother   . Arthritis Mother   . Hypertension Father   . Hypertension Paternal Grandmother   . Hyperlipidemia Paternal Grandmother   . Depression Neg Hx     Allergies  Allergen Reactions  . Latex Rash    Current Outpatient Prescriptions on File Prior to Visit  Medication Sig Dispense Refill  . Fe Fum-FePoly-Vit C-Vit B3 (INTEGRA) 62.5-62.5-40-3 MG CAPS Take one daily  Give generic 30 capsule 3  . ibuprofen (ADVIL,MOTRIN) 600 MG tablet Take 1 tablet (600 mg total) by mouth every 6 (six) hours as  needed for pain. 36 tablet 2  . tretinoin (RETIN-A) 0.025 % cream   2   No current facility-administered medications on file prior to visit.    BP 124/98 mmHg  Pulse 83  Temp(Src) 100.8 F (38.2 C) (Oral)  Ht 5' 5.5" (1.664 m)  Wt 244 lb 12.8 oz (111.041 kg)  BMI 40.10 kg/m2  SpO2 99%  LMP 02/04/2015  Breastfeeding? No      Objective:   Physical Exam  General  Mental Status - Alert. General Appearance - Well groomed. Not in acute distress.  Skin Rashes- No Rashes.  HEENT Head- Normal. Ear Auditory Canal - Left- Normal. Right - Normal.Tympanic Membrane- Left- Normal. Right- Normal. Eye Sclera/Conjunctiva- Left- Normal. Right- Normal. Nose & Sinuses Nasal Mucosa- Left-  Not  boggy or Congested. Right-  Not  boggy or Congested. Mouth & Throat Lips: Upper Lip- Normal: no dryness, cracking, pallor, cyanosis, or vesicular eruption. Lower Lip-Normal: no dryness, cracking, pallor, cyanosis or vesicular eruption. Buccal Mucosa- Bilateral- No Aphthous ulcers. Oropharynx- No Discharge or Erythema. Tonsils: Characteristics- Bilateral-  birght Erythema + Congestion. Size/Enlargement- Bilateral-  2+ enlargement. Discharge- bilateral-yes.(Swollen and symmetric) Neck Neck- Supple. No Masses.   Chest and Lung Exam Auscultation: Breath Sounds:- even and unlabored,  Cardiovascular Auscultation:Rythm- Regular, rate and rhythm. Murmurs & Other Heart Sounds:Ausculatation of the heart reveal- No Murmurs.  Lymphatic Head & Neck General Head & Neck Lymphatics: Bilateral: Description- No Localized lymphadenopathy.       Assessment & Plan:

## 2015-02-11 NOTE — Patient Instructions (Addendum)
Acute pharyngitis Rocephin 1 gram im. Not very large and exudative tonsils. Rx azithromycin antibiotic as well.  Depression Stable.  Anemia Will check her cbc today.   If not significantly improved by wed then return for evaluation. If you feel worsen such as swollowing difficulty or excess salivation then ED evaluation.  Follow up 5 days any persisting symptms or as needed.

## 2015-02-12 LAB — CBC WITH DIFFERENTIAL/PLATELET
BASOS ABS: 0.1 10*3/uL (ref 0.0–0.1)
Basophils Relative: 0.6 % (ref 0.0–3.0)
Eosinophils Absolute: 0.2 10*3/uL (ref 0.0–0.7)
Eosinophils Relative: 1.2 % (ref 0.0–5.0)
HCT: 38.1 % (ref 36.0–46.0)
Hemoglobin: 12 g/dL (ref 12.0–15.0)
LYMPHS ABS: 2.4 10*3/uL (ref 0.7–4.0)
LYMPHS PCT: 13.7 % (ref 12.0–46.0)
MCHC: 31.4 g/dL (ref 30.0–36.0)
MCV: 73.8 fl — AB (ref 78.0–100.0)
MONOS PCT: 5.6 % (ref 3.0–12.0)
Monocytes Absolute: 1 10*3/uL (ref 0.1–1.0)
Neutro Abs: 13.7 10*3/uL — ABNORMAL HIGH (ref 1.4–7.7)
Neutrophils Relative %: 78.9 % — ABNORMAL HIGH (ref 43.0–77.0)
Platelets: 328 10*3/uL (ref 150.0–400.0)
RBC: 5.16 Mil/uL — AB (ref 3.87–5.11)
RDW: 17.5 % — AB (ref 11.5–15.5)
WBC: 17.4 10*3/uL — ABNORMAL HIGH (ref 4.0–10.5)

## 2015-02-21 ENCOUNTER — Encounter: Payer: Self-pay | Admitting: Medical

## 2015-02-21 ENCOUNTER — Ambulatory Visit (INDEPENDENT_AMBULATORY_CARE_PROVIDER_SITE_OTHER): Payer: BLUE CROSS/BLUE SHIELD | Admitting: Medical

## 2015-02-21 VITALS — BP 125/76 | HR 70 | Temp 98.5°F | Ht 65.5 in | Wt 256.8 lb

## 2015-02-21 DIAGNOSIS — J029 Acute pharyngitis, unspecified: Secondary | ICD-10-CM | POA: Diagnosis not present

## 2015-02-21 DIAGNOSIS — D72829 Elevated white blood cell count, unspecified: Secondary | ICD-10-CM | POA: Diagnosis not present

## 2015-02-21 DIAGNOSIS — F329 Major depressive disorder, single episode, unspecified: Secondary | ICD-10-CM | POA: Diagnosis not present

## 2015-02-21 DIAGNOSIS — D649 Anemia, unspecified: Secondary | ICD-10-CM

## 2015-02-21 DIAGNOSIS — F32A Depression, unspecified: Secondary | ICD-10-CM

## 2015-02-21 MED ORDER — VENLAFAXINE HCL ER 37.5 MG PO CP24: 37.5000 mg | ORAL_CAPSULE | Freq: Every day | ORAL | Status: DC

## 2015-02-21 MED ORDER — FLUCONAZOLE 150 MG PO TABS: 150.0000 mg | ORAL_TABLET | Freq: Once | ORAL | Status: DC

## 2015-02-21 NOTE — Assessment & Plan Note (Addendum)
By cbc not anemic but had hx of in the past.

## 2015-02-21 NOTE — Progress Notes (Signed)
Subjective:    Patient ID: Teresa Perez, female    DOB: [birth year], 39 y.o.   MRN: [id number]  HPI  Pt is in for follow up. Her throat feels better now. It took about 1 day for her to feel better. No pain on swallowing. No fever or chills.  Symptoms of throat now resolved.  Pt states does have vaginal itching last wed while on the antibiotic. Pt tried monistat this week. Feel some better but not completely better  LMP- February 04, 2015.  Depression- Pt  Stating today mild  depressed but more stressed. Her boyfriend got arm injury and she is having to help him along with her 5 kids.   Pt wbc was 17.4 at time of throat infection.  Pt was not anemic.     Review of Systems  Constitutional: Negative for fever, chills, diaphoresis, activity change and fatigue.  Respiratory: Negative for cough, chest tightness and shortness of breath.   Cardiovascular: Negative for chest pain, palpitations and leg swelling.  Gastrointestinal: Negative for nausea, vomiting and abdominal pain.  Musculoskeletal: Negative for neck pain and neck stiffness.  Neurological: Negative for dizziness, tremors, seizures, syncope, facial asymmetry, speech difficulty, weakness, light-headedness, numbness and headaches.  Psychiatric/Behavioral: Positive for sleep disturbance and dysphoric mood. Negative for suicidal ideas, behavioral problems, confusion and agitation. The patient is not nervous/anxious.        No history of suicide attempt.   But used to cut herself.  That was 10 yrs ago and none since. She states helped with emotional pain.But never had suicide attempt she used to see counseling. None for years.    Past Medical History  Diagnosis Date  . Hypertension     pt states she was treated with hctz for pre-htn for approx 30mo-has not taken since 07/2010  . Asthma     childhood asthma  . Anxiety   . Depression     has been treated in past-no present meds  . PCOS (polycystic ovarian syndrome)   . Boil    tendency to form boils  . Hx of migraine headaches   . Headache(784.0)   . Family history of anesthesia complication 29326   son- age 40- "had lockjaw" after surgery (T&A)  . Anemia     History   Social History  . Marital Status: Single    Spouse Name: N/A  . Number of Children: N/A  . Years of Education: N/A   Occupational History  . Not on file.   Social History Main Topics  . Smoking status: Current Some Day Smoker -- 0.25 packs/day for 16 years    Types: Cigarettes  . Smokeless tobacco: Never Used  . Alcohol Use: No     Comment: occasional   . Drug Use: No  . Sexual Activity: Yes    Birth Control/ Protection: None   Other Topics Concern  . Not on file   Social History Narrative    Past Surgical History  Procedure Laterality Date  . Cesarean section      x2 previous  . Mouth surgery  2004  . Cesarean section  07/03/2011    Procedure: CESAREAN SECTION;  Surgeon: SAlwyn Pea MD;  [facility];  Service: Gynecology;  Laterality: N/A;  . Cesarean section N/A 06/22/2013    Procedure: CESAREAN SECTION, repeat;  Surgeon: VEldred Manges MD;  [facility];  Service: Obstetrics;  Laterality: N/A;    Family History  Problem Relation Age of Onset  .  Anesthesia problems Son     son-had T&A-2008-had trismus  . Diabetes Mother   . Hypertension Mother   . Hyperlipidemia Mother   . Arthritis Mother   . Hypertension Father   . Hypertension Paternal Grandmother   . Hyperlipidemia Paternal Grandmother   . Depression Neg Hx     Allergies  Allergen Reactions  . Latex Rash    Current Outpatient Prescriptions on File Prior to Visit  Medication Sig Dispense Refill  . ibuprofen (ADVIL,MOTRIN) 600 MG tablet Take 1 tablet (600 mg total) by mouth every 6 (six) hours as needed for pain. 36 tablet 2  . Fe Fum-FePoly-Vit C-Vit B3 (INTEGRA) 62.5-62.5-40-3 MG CAPS Take one daily  Give generic (Patient not taking: Reported on 02/21/2015) 30 capsule 3  . tretinoin  (RETIN-A) 0.025 % cream   2   No current facility-administered medications on file prior to visit.    BP 125/76 mmHg  Pulse 70  Temp(Src) 98.5 F (36.9 C) (Oral)  Ht 5' 5.5" (1.664 m)  Wt 256 lb 12.8 oz (116.484 kg)  BMI 42.07 kg/m2  SpO2 99%  LMP 02/04/2015        Objective:   Physical Exam  General Mental Status- Alert. General Appearance- Not in acute distress.   Neck- from, No nuccal rigidity, Mild submandibular node hypertrophy.  Lungs- Clear even and unlabored.  Heart- Regular, rate and rhythm. HEENT- Head- normocephalic Eyes- PEERL bilaterally. Ears- Canals clear, normal tm's bilaterally. Nose- No frontal or maxillary sinus tenderness to palpation. Turbinates normal. Throat- posterior pharynx shows   tonsillar hypertrophy plus,  erythma,  discharge.    Neurologic Cranial Nerve exam:- CN III-XII intact(No nystagmus), symmetric smile. Strength:- 5/5 equal and symmetric strength both upper and lower extremities.      Assessment & Plan:

## 2015-02-21 NOTE — Assessment & Plan Note (Signed)
Resolved now.

## 2015-02-21 NOTE — Progress Notes (Signed)
Pre visit review using our clinic review tool, if applicable. No additional management support is needed unless otherwise documented below in the visit note. 

## 2015-02-21 NOTE — Assessment & Plan Note (Addendum)
Pt  statingvtoday mild  depressed but more stressed. Her boyfriend got arm injury and she is having to help him along with her 5 kids. Rx effexor low dose. Follow up in one month or as needed.  She request to try effexor. She used in past and did help. Other types caused sexual side effects.

## 2015-02-21 NOTE — Patient Instructions (Addendum)
Acute pharyngitis Resolved now.  Anemia By cbc not anemic but had hx of in the past.  Depression Pt  statingvtoday mild  depressed but more stressed. Her boyfriend got arm injury and she is having to help him along with her 5 kids. Rx effexor low dose. Follow up in one month or as needed.  She request to try effexor. She used in past and did help. Other types caused sexual side effects.  Leukocytosis Likely was from throat infection but wbc was very high and will put in future cbc today. She can repeat at her convenience. But lab closed today.   I talked with pt regarding her hx of depression. If  effexor not helping will refer to psychiatrist.    Follow up 1 month or as needed.  Diflucan for recent yeast infection

## 2015-02-21 NOTE — Assessment & Plan Note (Signed)
Likely was from throat infection but wbc was very high and will put in future cbc today. She can repeat at her convenience. But lab closed today.

## 2015-03-21 ENCOUNTER — Encounter: Payer: Self-pay | Admitting: Medical

## 2015-03-21 ENCOUNTER — Ambulatory Visit (INDEPENDENT_AMBULATORY_CARE_PROVIDER_SITE_OTHER): Payer: BLUE CROSS/BLUE SHIELD | Admitting: Medical

## 2015-03-21 VITALS — BP 143/94 | HR 73 | Temp 98.6°F | Ht 65.5 in | Wt 250.0 lb

## 2015-03-21 DIAGNOSIS — D72829 Elevated white blood cell count, unspecified: Secondary | ICD-10-CM

## 2015-03-21 DIAGNOSIS — F329 Major depressive disorder, single episode, unspecified: Secondary | ICD-10-CM | POA: Diagnosis not present

## 2015-03-21 DIAGNOSIS — F32A Depression, unspecified: Secondary | ICD-10-CM

## 2015-03-21 LAB — CBC WITH DIFFERENTIAL/PLATELET
BASOS PCT: 0.5 % (ref 0.0–3.0)
Basophils Absolute: 0.1 10*3/uL (ref 0.0–0.1)
Eosinophils Absolute: 0.3 10*3/uL (ref 0.0–0.7)
Eosinophils Relative: 2.9 % (ref 0.0–5.0)
HCT: 37.1 % (ref 36.0–46.0)
HEMOGLOBIN: 11.8 g/dL — AB (ref 12.0–15.0)
LYMPHS PCT: 28.7 % (ref 12.0–46.0)
Lymphs Abs: 3.4 10*3/uL (ref 0.7–4.0)
MCHC: 31.8 g/dL (ref 30.0–36.0)
MCV: 73.9 fl — AB (ref 78.0–100.0)
Monocytes Absolute: 0.7 10*3/uL (ref 0.1–1.0)
Monocytes Relative: 6.4 % (ref 3.0–12.0)
Neutro Abs: 7.2 10*3/uL (ref 1.4–7.7)
Neutrophils Relative %: 61.5 % (ref 43.0–77.0)
PLATELETS: 357 10*3/uL (ref 150.0–400.0)
RBC: 5.02 Mil/uL (ref 3.87–5.11)
RDW: 17.6 % — AB (ref 11.5–15.5)
WBC: 11.8 10*3/uL — AB (ref 4.0–10.5)

## 2015-03-21 MED ORDER — VENLAFAXINE HCL ER 75 MG PO CP24: 75.0000 mg | ORAL_CAPSULE | Freq: Every day | ORAL | Status: DC

## 2015-03-21 MED ORDER — CLONAZEPAM 0.5 MG PO TABS: 0.5000 mg | ORAL_TABLET | Freq: Two times a day (BID) | ORAL | Status: DC | PRN

## 2015-03-21 NOTE — Assessment & Plan Note (Addendum)
Not much improved with recent anxiety/panic.   Increase effexor to 75 mg a day. Rx klonopin to use as needed panick attacks.  If any thoughts harm self or others then ED evaluation.  Follow up in 3 wks or as needed.  I am counselor card and sheet with Psychiatrist office numbers.Please schedule both. I will see you during interim prior to these appointnments

## 2015-03-21 NOTE — Patient Instructions (Addendum)
Depression Not much improved with recent anxiety/panic.   Increase effexor to 75 mg a day. Rx klonopin to use as needed panick attacks.  If any thoughts harm self or others then ED evaluation.  Follow up in 3 wks or as needed.  I am counselor card and sheet with Psychiatrist office numbers.Please schedule both. I will see you during interim prior to these appointnments   Recheck her bp on follow up. Last 3 bp were good. Today could be related to emotions.

## 2015-03-21 NOTE — Progress Notes (Signed)
Pre visit review using our clinic review tool, if applicable. No additional management support is needed unless otherwise documented below in the visit note. 

## 2015-03-21 NOTE — Progress Notes (Signed)
Subjective:    Patient ID: Teresa Perez, female    DOB: 08-Apr-1976, 39 y.o.   MRN: [id number]  HPI  Pt in states she felt depressed today. I had given effexor by low dose on last visit. She states despite being on med still depressed. Prior meds in past did not work and caused sexual side effects. Pt states effexor not effecting libido. Overall feels little better but not much.   Pt states at work today felt acute anxious and panicky. 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  LMP- 2 wks ago.  Pt is thinks sad due to her dad moving to [place].  Pt used to have anxiety features with depression. She was on xanax in the past. But got off. Has not been on for years.  Since I last saw her she is feeling angry.      Review of Systems  Constitutional: Negative for fever, chills and fatigue.  Respiratory: Negative for cough, shortness of breath and wheezing.   Cardiovascular: Negative for chest pain and palpitations.  Hematological: Negative for adenopathy. Does not bruise/bleed easily.  Psychiatric/Behavioral: Positive for dysphoric mood. Negative for suicidal ideas, behavioral problems, confusion and sleep disturbance. The patient is nervous/anxious.     Past Medical History  Diagnosis Date  . Hypertension     pt states she was treated with hctz for pre-htn for approx 36mo-has not taken since 07/2010  . Asthma     childhood asthma  . Anxiety   . Depression     has been treated in past-no present meds  . PCOS (polycystic ovarian syndrome)   . Boil     tendency to form boils  . Hx of migraine headaches   . Headache(784.0)   . Family history of anesthesia complication 22119   son- age 40- "had lockjaw" after surgery (T&A)  . Anemia     History   Social History  . Marital Status: Single    Spouse Name: N/A  . Number of Children: N/A  . Years of Education: N/A   Occupational History  . Not on file.   Social History Main Topics  . Smoking status:  Current Some Day Smoker -- 0.25 packs/day for 16 years    Types: Cigarettes  . Smokeless tobacco: Never Used  . Alcohol Use: No     Comment: occasional   . Drug Use: No  . Sexual Activity: Yes    Birth Control/ Protection: None   Other Topics Concern  . Not on file   Social History Narrative    Past Surgical History  Procedure Laterality Date  . Cesarean section      x2 previous  . Mouth surgery  2004  . Cesarean section  07/03/2011    Procedure: CESAREAN SECTION;  Surgeon: SAlwyn Pea MD;  [facility];  Service: Gynecology;  Laterality: N/A;  . Cesarean section N/A 06/22/2013    Procedure: CESAREAN SECTION, repeat;  Surgeon: VEldred Manges MD;  [facility];  Service: Obstetrics;  Laterality: N/A;    Family History  Problem Relation Age of Onset  . Anesthesia problems Son     son-had T&A-2008-had trismus  . Diabetes Mother   . Hypertension Mother   . Hyperlipidemia Mother   . Arthritis Mother   . Hypertension Father   . Hypertension Paternal Grandmother   . Hyperlipidemia Paternal Grandmother   . Depression Neg Hx     Allergies  Allergen Reactions  .  Latex Rash    Current Outpatient Prescriptions on File Prior to Visit  Medication Sig Dispense Refill  . ibuprofen (ADVIL,MOTRIN) 600 MG tablet Take 1 tablet (600 mg total) by mouth every 6 (six) hours as needed for pain. 36 tablet 2  . tretinoin (RETIN-A) 0.025 % cream   2  . venlafaxine XR (EFFEXOR XR) 37.5 MG 24 hr capsule Take 1 capsule (37.5 mg total) by mouth daily with breakfast. 30 capsule 0   No current facility-administered medications on file prior to visit.    BP 143/94 mmHg  Pulse 73  Temp(Src) 98.6 F (37 C) (Oral)  Ht 5' 5.5" (1.664 m)  Wt 250 lb (113.399 kg)  BMI 40.95 kg/m2  SpO2 100%  LMP 03/07/2015  Breastfeeding? No       Objective:   Physical Exam  General Mental Status- Alert. General Appearance- Not in acute distress.   Skin General: Color- Normal Color.  Moisture- Normal Moisture.  Neck Carotid Arteries- Normal color. Moisture- Normal Moisture. No carotid bruits. No JVD.  Chest and Lung Exam Auscultation: Breath Sounds:-Normal.  Cardiovascular Auscultation:Rythm- Regular. Murmurs & Other Heart Sounds:Auscultation of the heart reveals- No Murmurs.  Abdomen Inspection:-Inspeection Normal. Palpation/Percussion:Note:No mass. Palpation and Percussion of the abdomen reveal- Non Tender, Non Distended + BS, no rebound or guarding.    Neurologic Cranial Nerve exam:- CN III-XII intact(No nystagmus), symmetric smile. Strength:- 5/5 equal and symmetric strength both upper and lower extremities.      Assessment & Plan:

## 2015-04-15 ENCOUNTER — Ambulatory Visit: Payer: BLUE CROSS/BLUE SHIELD | Admitting: Medical

## 2015-04-17 ENCOUNTER — Telehealth: Payer: Self-pay | Admitting: Medical

## 2015-04-17 MED ORDER — VENLAFAXINE HCL ER 75 MG PO CP24: 75.0000 mg | ORAL_CAPSULE | Freq: Every day | ORAL | Status: DC

## 2015-04-17 NOTE — Telephone Encounter (Signed)
I gave pt a limited number of tab.#7. She needs to come in to see me before these run out. She is relativley new pt to me. She is supposed to see both psychiatry and counseling eventually. Please confirm that she feels good/mood stable.

## 2015-04-17 NOTE — Telephone Encounter (Signed)
Caller name: Ciarra Braddy Relationship to patient: self Can be reached: [phone] Pharmacy: [id number] [place], [place] Reason for call: Pt called for refill on Effexor. She said that she has 1 left for tomorrow morning. She states she only takes 1/day when I stated she should be ok til 8/20. Pt appt was scheduled for 8/15 but changed to 8/24. Please advise pt if concerns or send RX to pharmacy above.

## 2015-04-17 NOTE — Telephone Encounter (Signed)
Spoke with patient who states she will be seeing Counsellor on 23 and back to this office on 24. States she can wait on refill.

## 2015-04-23 ENCOUNTER — Ambulatory Visit (INDEPENDENT_AMBULATORY_CARE_PROVIDER_SITE_OTHER): Payer: BLUE CROSS/BLUE SHIELD | Admitting: Licensed Clinical Social Worker

## 2015-04-23 DIAGNOSIS — F331 Major depressive disorder, recurrent, moderate: Secondary | ICD-10-CM

## 2015-04-24 ENCOUNTER — Encounter: Payer: Self-pay | Admitting: Medical

## 2015-04-24 ENCOUNTER — Ambulatory Visit (INDEPENDENT_AMBULATORY_CARE_PROVIDER_SITE_OTHER): Payer: BLUE CROSS/BLUE SHIELD | Admitting: Medical

## 2015-04-24 VITALS — BP 112/80 | HR 71 | Temp 97.8°F | Resp 16 | Ht 66.0 in | Wt 257.6 lb

## 2015-04-24 DIAGNOSIS — Z8639 Personal history of other endocrine, nutritional and metabolic disease: Secondary | ICD-10-CM | POA: Diagnosis not present

## 2015-04-24 DIAGNOSIS — F329 Major depressive disorder, single episode, unspecified: Secondary | ICD-10-CM

## 2015-04-24 DIAGNOSIS — F411 Generalized anxiety disorder: Secondary | ICD-10-CM

## 2015-04-24 DIAGNOSIS — F32A Depression, unspecified: Secondary | ICD-10-CM

## 2015-04-24 LAB — COMPREHENSIVE METABOLIC PANEL
ALBUMIN: 3.7 g/dL (ref 3.5–5.2)
ALK PHOS: 62 U/L (ref 39–117)
ALT: 10 U/L (ref 0–35)
AST: 13 U/L (ref 0–37)
BUN: 9 mg/dL (ref 6–23)
CHLORIDE: 106 meq/L (ref 96–112)
CO2: 26 mEq/L (ref 19–32)
Calcium: 8.9 mg/dL (ref 8.4–10.5)
Creatinine, Ser: 0.75 mg/dL (ref 0.40–1.20)
GFR: 110.15 mL/min (ref >60.00)
Glucose, Bld: 108 mg/dL — ABNORMAL HIGH (ref 70–99)
POTASSIUM: 3.7 meq/L (ref 3.5–5.1)
Sodium: 138 mEq/L (ref 135–145)
TOTAL PROTEIN: 7.1 g/dL (ref 6.0–8.3)
Total Bilirubin: 0.5 mg/dL (ref 0.2–1.2)

## 2015-04-24 MED ORDER — METFORMIN HCL 500 MG PO TABS: ORAL_TABLET | ORAL | Status: DC

## 2015-04-24 MED ORDER — VENLAFAXINE HCL ER 75 MG PO CP24: 75.0000 mg | ORAL_CAPSULE | Freq: Every day | ORAL | Status: DC

## 2015-04-24 MED ORDER — CLONAZEPAM 0.5 MG PO TABS: 0.5000 mg | ORAL_TABLET | Freq: Two times a day (BID) | ORAL | Status: DC | PRN

## 2015-04-24 NOTE — Progress Notes (Signed)
Pre visit review using our clinic review tool, if applicable. No additional management support is needed unless otherwise documented below in the visit note. 

## 2015-04-24 NOTE — Progress Notes (Signed)
Subjective:    Patient ID: Teresa Perez, female    DOB: 1976-01-28, 39 y.o.   MRN: [id number]  HPI  Pt states that she saw counselor yesterday. I also gave pt sheet for psychiatrist but she did not call psychiatrist. She states barrier getting a convenient appointment. She prefers 8 o'clock.  Pt states she likes Almyra Free the counselor.   Pt states prior medications caused sexual side effects. With other SSRI but she was not aware if effexor causing any sexual side effects. Her energy is much better. But she reports appetite improved. She thinks some weight gain. That is the case per our chart. She is exercising better.  LMP- April 06, 2015.     Past Medical History  Diagnosis Date  . Hypertension     pt states she was treated with hctz for pre-htn for approx 4mo-has not taken since 07/2010  . Asthma     childhood asthma  . Anxiety   . Depression     has been treated in past-no present meds  . PCOS (polycystic ovarian syndrome)   . Boil     tendency to form boils  . Hx of migraine headaches   . Headache(784.0)   . Family history of anesthesia complication 24174   son- age 40- "had lockjaw" after surgery (T&A)  . Anemia     Social History   Social History  . Marital Status: Single    Spouse Name: N/A  . Number of Children: N/A  . Years of Education: N/A   Occupational History  . Not on file.   Social History Main Topics  . Smoking status: Current Some Day Smoker -- 0.25 packs/day for 16 years    Types: Cigarettes  . Smokeless tobacco: Never Used  . Alcohol Use: No     Comment: occasional   . Drug Use: No  . Sexual Activity: Yes    Birth Control/ Protection: None   Other Topics Concern  . Not on file   Social History Narrative    Past Surgical History  Procedure Laterality Date  . Cesarean section      x2 previous  . Mouth surgery  2004  . Cesarean section  07/03/2011    Procedure: CESAREAN SECTION;  Surgeon: SAlwyn Pea MD;  [facility];   Service: Gynecology;  Laterality: N/A;  . Cesarean section N/A 06/22/2013    Procedure: CESAREAN SECTION, repeat;  Surgeon: VEldred Manges MD;  [facility];  Service: Obstetrics;  Laterality: N/A;    Family History  Problem Relation Age of Onset  . Anesthesia problems Son     son-had T&A-2008-had trismus  . Diabetes Mother   . Hypertension Mother   . Hyperlipidemia Mother   . Arthritis Mother   . Hypertension Father   . Hypertension Paternal Grandmother   . Hyperlipidemia Paternal Grandmother   . Depression Neg Hx     Allergies  Allergen Reactions  . Latex Rash    Current Outpatient Prescriptions on File Prior to Visit  Medication Sig Dispense Refill  . clonazePAM (KLONOPIN) 0.5 MG tablet Take 1 tablet (0.5 mg total) by mouth 2 (two) times daily as needed for anxiety. 10 tablet 0  . ibuprofen (ADVIL,MOTRIN) 600 MG tablet Take 1 tablet (600 mg total) by mouth every 6 (six) hours as needed for pain. 36 tablet 2  . tretinoin (RETIN-A) 0.025 % cream   2  . venlafaxine XR (EFFEXOR XR) 75 MG 24 hr capsule Take  1 capsule (75 mg total) by mouth daily with breakfast. 7 capsule 0   No current facility-administered medications on file prior to visit.    BP 112/80 mmHg  Pulse 71  Temp(Src) 97.8 F (36.6 C) (Oral)  Resp 16  Ht 5' 6"  (1.676 m)  Wt 257 lb 9.6 oz (116.847 kg)  BMI 41.60 kg/m2  SpO2 97%  LMP 04/06/2015      Review of Systems  Constitutional: Negative for fever, chills and fatigue.  HENT: Negative for congestion, ear pain and facial swelling.   Respiratory: Negative for cough, chest tightness, shortness of breath and wheezing.   Cardiovascular: Negative for chest pain and palpitations.  Gastrointestinal: Negative for abdominal pain, diarrhea, constipation and blood in stool.  Skin: Negative for rash.  Neurological: Negative for dizziness, syncope, speech difficulty, weakness, numbness and headaches.  Hematological: Negative for adenopathy. Does not  bruise/bleed easily.  Psychiatric/Behavioral: Positive for dysphoric mood. Negative for behavioral problems and agitation. The patient is nervous/anxious.        Some better but symptoms still persist.       Objective:   Physical Exam  General Mental Status- Alert. General Appearance- Not in acute distress.   Skin General: Color- Normal Color. Moisture- Normal Moisture.  Neck Carotid Arteries- Normal color. Moisture- Normal Moisture. No carotid bruits. No JVD.  Chest and Lung Exam Auscultation: Breath Sounds:-Normal.  Cardiovascular Auscultation:Rythm- Regular. Murmurs & Other Heart Sounds:Auscultation of the heart reveals- No Murmurs.    Neurologic Cranial Nerve exam:- CN III-XII intact(No nystagmus), symmetric smile. Strength:- 5/5 equal and symmetric strength both upper and lower extremities.      Assessment & Plan:

## 2015-04-24 NOTE — Patient Instructions (Addendum)
Depression I will advise continue the effexor at same dose and same dose of clonopin. On exam pt demeanor is overall better. But stay on same dose of medications. Continue to see counselor and please call and set up psychiatry visit. I am refilling your effexor and clonopin today. Follow up in 2 months if by then no appointment with pschyhiatry.   Follow up in 2 months or as needed.  Also recommend complete physical in 3 months.

## 2015-04-24 NOTE — Assessment & Plan Note (Signed)
I will advise continue the effexor at same dose and same dose of clonopin. On exam pt demeanor is overall better. But stay on same dose of medications. Continue to see counselor and please call and set up psychiatry visit. I am refilling your effexor and clonopin today. Follow up in 2 months if by then no appointment with pschyhiatry.

## 2015-04-24 NOTE — Telephone Encounter (Signed)
rx metformin

## 2015-04-30 ENCOUNTER — Ambulatory Visit: Payer: BLUE CROSS/BLUE SHIELD | Admitting: Medical

## 2015-04-30 ENCOUNTER — Telehealth: Payer: Self-pay | Admitting: Medical

## 2015-04-30 ENCOUNTER — Other Ambulatory Visit: Payer: Self-pay

## 2015-04-30 NOTE — Telephone Encounter (Signed)
Relation to LH:TDSK Call back [phone] Pharmacy: [facility] [place], [place]  Reason for call:  Patient requesting a 90 day supply of venlafaxine XR (EFFEXOR XR) 75 MG 24 hr capsule.

## 2015-04-30 NOTE — Telephone Encounter (Signed)
Pt request a call back from the nurse. She would like to discuss her medications.    CB#: [phone]

## 2015-05-01 MED ORDER — VENLAFAXINE HCL ER 75 MG PO CP24: 75.0000 mg | ORAL_CAPSULE | Freq: Every day | ORAL | Status: DC

## 2015-05-01 NOTE — Telephone Encounter (Signed)
I called pt. She has appointment with psychiatrist next week. So I will give her 30 tabs effexor since pt states pharmacy had problems filling just 7 tabs. She states she does not need mail order. Would defer mail order or other rx to psychiatrist.

## 2015-05-01 NOTE — Addendum Note (Signed)
Addended by: Tasia Catchings on: 05/01/2015 03:06 PM   Modules accepted: Medications

## 2015-05-01 NOTE — Telephone Encounter (Signed)
Patient called to request 30 day supply of Effexor 75, states she is all the way out. States she has to pay money up front before appointment can be scheduled. States she is going to try to get in next week.

## 2015-05-02 ENCOUNTER — Telehealth: Payer: Self-pay | Admitting: Medical

## 2015-05-02 NOTE — Telephone Encounter (Signed)
I talked with pt and sent in her effexor. Gave her 30 tabs.

## 2015-05-09 ENCOUNTER — Ambulatory Visit: Payer: BLUE CROSS/BLUE SHIELD | Admitting: Licensed Clinical Social Worker

## 2015-05-23 ENCOUNTER — Ambulatory Visit: Payer: BLUE CROSS/BLUE SHIELD | Admitting: Licensed Clinical Social Worker

## 2015-08-14 ENCOUNTER — Telehealth: Payer: Self-pay | Admitting: Medical

## 2015-08-14 NOTE — Telephone Encounter (Signed)
Patient had Flu Shot in mid October @ her job. ([employer])

## 2015-08-14 NOTE — Telephone Encounter (Signed)
Documented 08/14/15.

## 2015-10-17 ENCOUNTER — Other Ambulatory Visit: Payer: Self-pay | Admitting: Obstetrics and Gynecology

## 2015-10-24 ENCOUNTER — Other Ambulatory Visit: Payer: Self-pay | Admitting: Obstetrics and Gynecology

## 2015-10-24 NOTE — Patient Instructions (Signed)
Your procedure is scheduled on:  Thursday, October 31, 2015  Enter through the [employer] [facility] at:  12:30 PM  Pick up the phone at the desk and dial [phone].  Call this number if you have problems the morning of surgery: [phone].  Remember:  Do NOT eat food:  After Midnight Wednesday  Do NOT drink clear liquids after:  10:00 AM day of surgery  Take these medicines the morning of surgery with a SIP OF WATER:  Effexor  Do NOT wear jewelry (body piercing), metal hair clips/bobby pins, make-up, or nail polish. Do NOT wear lotions, powders, or perfumes.  You may wear deoderant. Do NOT shave for 48 hours prior to surgery. Do NOT bring valuables to the hospital. Contacts, dentures, or bridgework may not be worn into surgery.  Have a responsible adult drive you home and stay with you for 24 hours after your procedure

## 2015-10-25 ENCOUNTER — Inpatient Hospital Stay (HOSPITAL_COMMUNITY)
Admission: RE | Admit: 2015-10-25 | Discharge: 2015-10-25 | Disposition: A | Discharge status: Home / Self Care | Payer: Self-pay | Source: Ambulatory Visit

## 2015-10-29 ENCOUNTER — Encounter (HOSPITAL_COMMUNITY): Payer: Self-pay | Admitting: *Deleted

## 2015-10-29 ENCOUNTER — Inpatient Hospital Stay (HOSPITAL_COMMUNITY)
Admission: RE | Admit: 2015-10-29 | Discharge: 2015-10-29 | Disposition: A | Discharge status: Home / Self Care | Payer: Self-pay | Source: Ambulatory Visit

## 2015-10-29 DIAGNOSIS — N920 Excessive and frequent menstruation with regular cycle: Secondary | ICD-10-CM | POA: Diagnosis present

## 2015-10-29 DIAGNOSIS — R87613 High grade squamous intraepithelial lesion on cytologic smear of cervix (HGSIL): Secondary | ICD-10-CM | POA: Diagnosis present

## 2015-10-29 DIAGNOSIS — IMO0002 Reserved for concepts with insufficient information to code with codable children: Secondary | ICD-10-CM | POA: Diagnosis present

## 2015-10-29 DIAGNOSIS — R87619 Unspecified abnormal cytological findings in specimens from cervix uteri: Secondary | ICD-10-CM | POA: Diagnosis present

## 2015-10-29 HISTORY — DX: Excessive and frequent menstruation with regular cycle: N92.0

## 2015-10-29 NOTE — H&P (Signed)
Teresa Perez is an 40 y.o. 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  The patient has also had irregular and heavy menstrual bleeding with a Paragard IUD in place and wants to change to [place] IUD.  Pertinent Gynecological History: Menses: flow is excessive with use of 12-24 pads or tampons on heaviest days Bleeding: intermenstrual bleeding Contraception: IUD DES exposure: unknown Blood transfusions: none Sexually transmitted diseases: no past history Previous GYN Procedures: cesarean sections  Last mammogram: normal Date: 2017 Last pap: abnormal: ASC-H Date: 2017 OB History: G4, P4  Menstrual History: Menarche age: 41 No LMP recorded.    Past Medical History  Diagnosis Date  . Hypertension     pt states she was treated with hctz for pre-htn for approx 53mo-has not taken since 07/2010  . Asthma     childhood asthma  . Anxiety   . Depression     has been treated in past-no present meds  . PCOS (polycystic ovarian syndrome)   . Boil     tendency to form boils  . Hx of migraine headaches   . Headache(784.0)   . Family history of anesthesia complication 25573   son- age 41- "had lockjaw" after surgery (T&A)  . Anemia     Past Surgical History  Procedure Laterality Date  . Cesarean section      x2 previous  . Mouth surgery  2004  . Cesarean section  07/03/2011    Procedure: CESAREAN SECTION;  Surgeon: SAlwyn Pea MD;  [facility];  Service: Gynecology;  Laterality: N/A;  . Cesarean section N/A 06/22/2013    Procedure: CESAREAN SECTION, repeat;  Surgeon: VEldred Manges MD;  [facility];  Service: Obstetrics;  Laterality: N/A;    Family History  Problem Relation Age of Onset  . Anesthesia problems Son     son-had T&A-2008-had trismus  . Diabetes Mother   . Hypertension Mother   . Hyperlipidemia Mother   . Arthritis Mother   . Hypertension Father   . Hypertension Paternal  Grandmother   . Hyperlipidemia Paternal Grandmother   . Depression Neg Hx     Social History:  reports that she has been smoking Cigarettes.  She has a 4 pack-year smoking history. She has never used smokeless tobacco. She reports that she does not drink alcohol or use illicit drugs.  Allergies:  Allergies  Allergen Reactions  . Latex Rash    No prescriptions prior to admission    Review of Systems  Constitutional: Negative.   HENT: Negative.   Eyes: Negative.   Respiratory: Negative.   Cardiovascular: Negative.   Gastrointestinal: Negative.   Genitourinary: Negative.   Musculoskeletal: Negative.   Skin: Positive for rash.       Chronic eczema with exacerbation.  Neurological: Negative.   Endo/Heme/Allergies: Negative.   Psychiatric/Behavioral: Positive for depression.       Chronic, currently well managed.    There were no vitals taken for this visit. Physical Exam  Constitutional: She is oriented to person, place, and time.  Severly obese  HENT:  Head: Normocephalic and atraumatic.  Eyes: Conjunctivae and EOM are normal. Pupils are equal, round, and reactive to light.  Neck: Normal range of motion. Neck supple.  Cardiovascular: Normal rate and regular rhythm.   Respiratory: Effort normal and breath sounds normal.  GI: Soft. Bowel sounds are normal.  Exam limited by body habitus  Genitourinary:  Pelvic  exam:  VULVA: normal appearing vulva with no masses, tenderness or lesions,  VAGINA: normal appearing vagina with normal color and discharge, no lesions,  CERVIX: normal appearing cervix without discharge or lesions, multiparous os, IUD string seen, UTERUS: uterus feels normal size, shape, consistency and nontender, mobile, size difficult to discern as exam is limited by body habitus,  ADNEXA: no masses, exam limited by body habitus, RECTAL: rectal exam not indicated.  Musculoskeletal: Normal range of motion.  Neurological: She is alert and oriented to person,  place, and time.  Skin: Skin is warm and dry. Rash noted.  Psychiatric: She has a normal mood and affect.    No results found for this or any previous visit (from the past 24 hour(s)).  No results found.  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  I have reviewed the technical difficulty of performing this surgery in the presence of an IUD, and the patient is amenable to having that removed if necessary, as she is planning to change to a [place] IUD soon to address the issue of menorrhagia. I discussed the risks of  bleeding, infection, injury to surrounding organs and need for additional procedures.  Pt wants IUD removed.   Kolt Mcwhirter P 10/29/2015, 9:44 AM

## 2015-10-31 ENCOUNTER — Ambulatory Visit (HOSPITAL_COMMUNITY): Payer: BLUE CROSS/BLUE SHIELD | Admitting: Certified Registered Nurse Anesthetist

## 2015-10-31 ENCOUNTER — Encounter (HOSPITAL_COMMUNITY): Payer: Self-pay | Admitting: *Deleted

## 2015-10-31 ENCOUNTER — Ambulatory Visit (HOSPITAL_COMMUNITY)
Admission: RE | Admit: 2015-10-31 | Discharge: 2015-10-31 | Disposition: A | Discharge status: Home / Self Care | Payer: BLUE CROSS/BLUE SHIELD | Source: Ambulatory Visit | Attending: Obstetrics and Gynecology | Admitting: Obstetrics and Gynecology

## 2015-10-31 ENCOUNTER — Encounter (HOSPITAL_COMMUNITY)
Admission: RE | Disposition: A | Discharge status: Home / Self Care | Payer: Self-pay | Source: Ambulatory Visit | Attending: Obstetrics and Gynecology

## 2015-10-31 DIAGNOSIS — J45909 Unspecified asthma, uncomplicated: Secondary | ICD-10-CM | POA: Insufficient documentation

## 2015-10-31 DIAGNOSIS — F329 Major depressive disorder, single episode, unspecified: Secondary | ICD-10-CM | POA: Diagnosis not present

## 2015-10-31 DIAGNOSIS — Z30432 Encounter for removal of intrauterine contraceptive device: Secondary | ICD-10-CM | POA: Diagnosis not present

## 2015-10-31 DIAGNOSIS — R0989 Other specified symptoms and signs involving the circulatory and respiratory systems: Secondary | ICD-10-CM | POA: Diagnosis present

## 2015-10-31 DIAGNOSIS — I10 Essential (primary) hypertension: Secondary | ICD-10-CM | POA: Insufficient documentation

## 2015-10-31 DIAGNOSIS — Z91040 Latex allergy status: Secondary | ICD-10-CM | POA: Insufficient documentation

## 2015-10-31 DIAGNOSIS — F17210 Nicotine dependence, cigarettes, uncomplicated: Secondary | ICD-10-CM | POA: Insufficient documentation

## 2015-10-31 DIAGNOSIS — J989 Respiratory disorder, unspecified: Secondary | ICD-10-CM | POA: Diagnosis present

## 2015-10-31 DIAGNOSIS — N920 Excessive and frequent menstruation with regular cycle: Secondary | ICD-10-CM | POA: Diagnosis present

## 2015-10-31 DIAGNOSIS — R87619 Unspecified abnormal cytological findings in specimens from cervix uteri: Secondary | ICD-10-CM | POA: Diagnosis present

## 2015-10-31 DIAGNOSIS — E282 Polycystic ovarian syndrome: Secondary | ICD-10-CM | POA: Insufficient documentation

## 2015-10-31 DIAGNOSIS — D649 Anemia, unspecified: Secondary | ICD-10-CM | POA: Diagnosis present

## 2015-10-31 DIAGNOSIS — IMO0002 Reserved for concepts with insufficient information to code with codable children: Secondary | ICD-10-CM | POA: Diagnosis present

## 2015-10-31 DIAGNOSIS — F419 Anxiety disorder, unspecified: Secondary | ICD-10-CM | POA: Insufficient documentation

## 2015-10-31 DIAGNOSIS — N938 Other specified abnormal uterine and vaginal bleeding: Secondary | ICD-10-CM | POA: Diagnosis present

## 2015-10-31 DIAGNOSIS — Z6841 Body Mass Index (BMI) 40.0 and over, adult: Secondary | ICD-10-CM | POA: Diagnosis not present

## 2015-10-31 DIAGNOSIS — N870 Mild cervical dysplasia: Secondary | ICD-10-CM | POA: Diagnosis not present

## 2015-10-31 DIAGNOSIS — R87613 High grade squamous intraepithelial lesion on cytologic smear of cervix (HGSIL): Secondary | ICD-10-CM | POA: Diagnosis present

## 2015-10-31 HISTORY — DX: Pneumonia, unspecified organism: J18.9

## 2015-10-31 HISTORY — PX: IUD REMOVAL: SHX5392

## 2015-10-31 HISTORY — PX: CERVICAL CONIZATION W/BX: SHX1330

## 2015-10-31 LAB — CBC
HEMATOCRIT: 35.9 % — AB (ref 36.0–46.0)
HEMOGLOBIN: 11.5 g/dL — AB (ref 12.0–15.0)
MCH: 22.4 pg — AB (ref 26.0–34.0)
MCHC: 32 g/dL (ref 30.0–36.0)
MCV: 70 fL — ABNORMAL LOW (ref 78.0–100.0)
Platelets: 240 10*3/uL (ref 150–400)
RBC: 5.13 MIL/uL — ABNORMAL HIGH (ref 3.87–5.11)
RDW: 17.8 % — AB (ref 11.5–15.5)
WBC: 9.1 10*3/uL (ref 4.0–10.5)

## 2015-10-31 LAB — PREGNANCY, URINE: Preg Test, Ur: NEGATIVE

## 2015-10-31 SURGERY — CONE BIOPSY, CERVIX
Anesthesia: General | Site: Vagina

## 2015-10-31 MED ORDER — LACTATED RINGERS IV SOLN
INTRAVENOUS | Status: DC
  Administered 2015-10-31: 13:00:00 via INTRAVENOUS

## 2015-10-31 MED ORDER — MIDAZOLAM HCL 2 MG/2ML IJ SOLN
INTRAMUSCULAR | Status: AC
  Filled 2015-10-31: qty 2

## 2015-10-31 MED ORDER — LIDOCAINE HCL (CARDIAC) 20 MG/ML IV SOLN
INTRAVENOUS | Status: DC | PRN
  Administered 2015-10-31: 80 mg via INTRAVENOUS

## 2015-10-31 MED ORDER — LIDOCAINE HCL (CARDIAC) 20 MG/ML IV SOLN
INTRAVENOUS | Status: AC
  Filled 2015-10-31: qty 5

## 2015-10-31 MED ORDER — SODIUM CHLORIDE 0.9 % IJ SOLN
INTRAMUSCULAR | Status: AC
  Filled 2015-10-31: qty 50

## 2015-10-31 MED ORDER — LIDOCAINE HCL 2 % IJ SOLN
INTRAMUSCULAR | Status: AC
  Filled 2015-10-31: qty 20

## 2015-10-31 MED ORDER — DEXAMETHASONE SODIUM PHOSPHATE 10 MG/ML IJ SOLN
INTRAMUSCULAR | Status: DC | PRN
  Administered 2015-10-31: 4 mg via INTRAVENOUS

## 2015-10-31 MED ORDER — PROPOFOL 10 MG/ML IV BOLUS
INTRAVENOUS | Status: AC
Start: 2015-10-31 — End: 2015-10-31
  Filled 2015-10-31: qty 20

## 2015-10-31 MED ORDER — FENTANYL CITRATE (PF) 100 MCG/2ML IJ SOLN
INTRAMUSCULAR | Status: DC | PRN
  Administered 2015-10-31 (×2): 50 ug via INTRAVENOUS

## 2015-10-31 MED ORDER — PROPOFOL 10 MG/ML IV BOLUS
INTRAVENOUS | Status: AC
  Filled 2015-10-31: qty 20

## 2015-10-31 MED ORDER — LACTATED RINGERS IV SOLN
INTRAVENOUS | Status: DC
  Administered 2015-10-31: 16:00:00 via INTRAVENOUS

## 2015-10-31 MED ORDER — FENTANYL CITRATE (PF) 100 MCG/2ML IJ SOLN
INTRAMUSCULAR | Status: AC
  Filled 2015-10-31: qty 2

## 2015-10-31 MED ORDER — ONDANSETRON HCL 4 MG/2ML IJ SOLN
INTRAMUSCULAR | Status: DC | PRN
  Administered 2015-10-31: 4 mg via INTRAVENOUS

## 2015-10-31 MED ORDER — KETOROLAC TROMETHAMINE 30 MG/ML IJ SOLN
INTRAMUSCULAR | Status: DC | PRN
  Administered 2015-10-31: 30 mg via INTRAVENOUS

## 2015-10-31 MED ORDER — IODINE STRONG (LUGOLS) 5 % PO SOLN
ORAL | Status: DC | PRN
  Administered 2015-10-31: 8 mL

## 2015-10-31 MED ORDER — SCOPOLAMINE 1 MG/3DAYS TD PT72
MEDICATED_PATCH | TRANSDERMAL | Status: AC
  Administered 2015-10-31: 1.5 mg via TRANSDERMAL
  Filled 2015-10-31: qty 1

## 2015-10-31 MED ORDER — VASOPRESSIN 20 UNIT/ML IV SOLN
INTRAVENOUS | Status: AC
  Filled 2015-10-31: qty 1

## 2015-10-31 MED ORDER — VASOPRESSIN 20 UNIT/ML IV SOLN
INTRAVENOUS | Status: DC | PRN
  Administered 2015-10-31: 30 mL via INTRAMUSCULAR

## 2015-10-31 MED ORDER — IODINE STRONG (LUGOLS) 5 % PO SOLN
ORAL | Status: AC
  Filled 2015-10-31: qty 1

## 2015-10-31 MED ORDER — ACETIC ACID 5 % SOLN
Status: AC
  Filled 2015-10-31: qty 500

## 2015-10-31 MED ORDER — MIDAZOLAM HCL 2 MG/2ML IJ SOLN
INTRAMUSCULAR | Status: DC | PRN
  Administered 2015-10-31: 2 mg via INTRAVENOUS

## 2015-10-31 MED ORDER — IBUPROFEN 600 MG PO TABS: ORAL_TABLET | ORAL | Status: DC

## 2015-10-31 MED ORDER — PROPOFOL 10 MG/ML IV BOLUS
INTRAVENOUS | Status: DC | PRN
  Administered 2015-10-31 (×2): 50 mg via INTRAVENOUS
  Administered 2015-10-31: 200 mg via INTRAVENOUS

## 2015-10-31 MED ORDER — KETOROLAC TROMETHAMINE 30 MG/ML IJ SOLN
INTRAMUSCULAR | Status: AC
  Filled 2015-10-31: qty 1

## 2015-10-31 MED ORDER — PROMETHAZINE HCL 25 MG/ML IJ SOLN: 6.2500 mg | INTRAMUSCULAR | Status: DC | PRN

## 2015-10-31 MED ORDER — ONDANSETRON HCL 4 MG/2ML IJ SOLN
INTRAMUSCULAR | Status: AC
  Filled 2015-10-31: qty 2

## 2015-10-31 MED ORDER — LIDOCAINE HCL 2 % IJ SOLN
INTRAMUSCULAR | Status: DC | PRN
  Administered 2015-10-31: 10 mL

## 2015-10-31 MED ORDER — MEPERIDINE HCL 25 MG/ML IJ SOLN: 6.2500 mg | INTRAMUSCULAR | Status: DC | PRN

## 2015-10-31 MED ORDER — SCOPOLAMINE 1 MG/3DAYS TD PT72
1.0000 | MEDICATED_PATCH | Freq: Once | TRANSDERMAL | Status: DC
  Administered 2015-10-31: 1.5 mg via TRANSDERMAL

## 2015-10-31 MED ORDER — FENTANYL CITRATE (PF) 100 MCG/2ML IJ SOLN: 25.0000 ug | INTRAMUSCULAR | Status: DC | PRN

## 2015-10-31 MED ORDER — FERRIC SUBSULFATE 259 MG/GM EX SOLN
CUTANEOUS | Status: AC
  Filled 2015-10-31: qty 8

## 2015-10-31 MED ORDER — DEXAMETHASONE SODIUM PHOSPHATE 4 MG/ML IJ SOLN
INTRAMUSCULAR | Status: AC
  Filled 2015-10-31: qty 1

## 2015-10-31 SURGICAL SUPPLY — 32 items
APPLICATOR COTTON TIP 6IN STRL (MISCELLANEOUS) ×2 IMPLANT
BLADE SURG 11 STRL SS (BLADE) ×2 IMPLANT
CATH ROBINSON RED A/P 16FR (CATHETERS) ×2 IMPLANT
CLOTH BEACON ORANGE TIMEOUT ST (SAFETY) ×2 IMPLANT
CONTAINER PREFILL 10% NBF 60ML (FORM) ×4 IMPLANT
COUNTER NEEDLE 1200 MAGNETIC (NEEDLE) ×2 IMPLANT
ELECT LLETZ BALL 3MM DISP (ELECTRODE) ×2 IMPLANT
ELECT REM PT RETURN 9FT ADLT (ELECTROSURGICAL) ×2
ELECTRODE REM PT RTRN 9FT ADLT (ELECTROSURGICAL) ×1 IMPLANT
GLOVE BIOGEL PI IND STRL 6.5 (GLOVE) ×1 IMPLANT
GLOVE BIOGEL PI IND STRL 7.0 (GLOVE) ×1 IMPLANT
GLOVE BIOGEL PI INDICATOR 6.5 (GLOVE) ×1
GLOVE BIOGEL PI INDICATOR 7.0 (GLOVE) ×1
GLOVE SURG SS PI 6.5 STRL IVOR (GLOVE) ×2 IMPLANT
GOWN STRL REUS W/TWL LRG LVL3 (GOWN DISPOSABLE) ×4 IMPLANT
NEEDLE SPNL 22GX3.5 QUINCKE BK (NEEDLE) ×2 IMPLANT
PACK VAGINAL MINOR WOMEN LF (CUSTOM PROCEDURE TRAY) ×2 IMPLANT
PAD OB MATERNITY 4.3X12.25 (PERSONAL CARE ITEMS) ×2 IMPLANT
PAD PREP 24X48 CUFFED NSTRL (MISCELLANEOUS) ×2 IMPLANT
PENCIL BUTTON HOLSTER BLD 10FT (ELECTRODE) ×2 IMPLANT
PIPET BIOPSY ENDOMETRIAL 3MM (SUCTIONS) ×2 IMPLANT
SCOPETTES 8  STERILE (MISCELLANEOUS) ×1
SCOPETTES 8 STERILE (MISCELLANEOUS) ×1 IMPLANT
SPONGE SURGIFOAM ABS GEL 12-7 (HEMOSTASIS) ×2 IMPLANT
SUT VIC AB 0 CT1 18XCR BRD8 (SUTURE) ×1 IMPLANT
SUT VIC AB 0 CT1 8-18 (SUTURE) ×1
SUT VIC AB 0 CT2 27 (SUTURE) ×2 IMPLANT
SYR CONTROL 10ML LL (SYRINGE) ×2 IMPLANT
TOWEL OR 17X24 6PK STRL BLUE (TOWEL DISPOSABLE) ×4 IMPLANT
TUBING NON-CON 1/4 X 20 CONN (TUBING) ×2 IMPLANT
WATER STERILE IRR 1000ML POUR (IV SOLUTION) ×2 IMPLANT
YANKAUER SUCT BULB TIP NO VENT (SUCTIONS) ×2 IMPLANT

## 2015-10-31 NOTE — Anesthesia Procedure Notes (Signed)
Procedure Name: LMA Insertion Date/Time: 10/31/2015 2:14 PM Performed by: Raenette Rover Pre-anesthesia Checklist: Patient identified, Patient being monitored, Emergency Drugs available and Suction available Patient Re-evaluated:Patient Re-evaluated prior to inductionOxygen Delivery Method: Circle system utilized Preoxygenation: Pre-oxygenation with 100% oxygen Intubation Type: IV induction Ventilation: Mask ventilation without difficulty LMA: LMA with gastric port inserted LMA Size: 4.0 Number of attempts: 1 Placement Confirmation: CO2 detector,  positive ETCO2 and breath sounds checked- equal and bilateral Tube secured with: Tape Dental Injury: Teeth and Oropharynx as per pre-operative assessment

## 2015-10-31 NOTE — Anesthesia Postprocedure Evaluation (Signed)
Anesthesia Post Note  Patient: Teresa Perez  Procedure(s) Performed: Procedure(s) (LRB): CONIZATION CERVIX WITH BIOPSY (N/A) INTRAUTERINE DEVICE (IUD) REMOVAL (N/A)  Patient location during evaluation: PACU Anesthesia Type: General Level of consciousness: awake and alert Pain management: pain level controlled Vital Signs Assessment: post-procedure vital signs reviewed and stable Respiratory status: spontaneous breathing, nonlabored ventilation, respiratory function stable and patient connected to nasal cannula oxygen Cardiovascular status: blood pressure returned to baseline and stable Postop Assessment: no signs of nausea or vomiting Anesthetic complications: no    Last Vitals:  Filed Vitals:   10/31/15 1256 10/31/15 1529  BP: 125/87 123/77  Pulse: 89 88  Temp: 36.3 C 37.4 C  Resp: 20 16    Last Pain: There were no vitals filed for this visit.               Montez Hageman

## 2015-10-31 NOTE — Transfer of Care (Signed)
Immediate Anesthesia Transfer of Care Note  Patient: Teresa Perez  Procedure(s) Performed: Procedure(s): CONIZATION CERVIX WITH BIOPSY (N/A) INTRAUTERINE DEVICE (IUD) REMOVAL (N/A)  Patient Location: PACU  Anesthesia Type:General  Level of Consciousness: awake, alert  and oriented  Airway & Oxygen Therapy: Patient Spontanous Breathing and Patient connected to nasal cannula oxygen  Post-op Assessment: Report given to RN and Post -op Vital signs reviewed and stable  Post vital signs: Reviewed and stable  Last Vitals:  Filed Vitals:   10/31/15 1256  BP: 125/87  Pulse: 89  Temp: 36.3 C  Resp: 20    Complications: No apparent anesthesia complications

## 2015-10-31 NOTE — Discharge Instructions (Signed)
Conization of the Cervix, Care After Refer to this sheet in the next few weeks. These instructions provide you with information on caring for yourself after your procedure. Your health care provider may also give you more specific instructions. Your treatment has been planned according to current medical practices but problems sometimes occur. Call your health care provider if you have any problems or questions after your procedure. WHAT TO EXPECT AFTER THE PROCEDURE After your procedure, it is typical to have the following sensations:  If you had a general anesthetic, you may be groggy for 2-3 hours after the procedure.  You may have cramps (similar to menstrual cramps) for about 1 week.   You may have a bloody discharge or light to moderate bleeding for 1-2 weeks. The bleeding should not be heavy (for example, it should not soak 1 pad in less than 1 hour).  You may have a black vaginal discharge that looks similar to coffee grounds. This is from the paste that was applied to the cervix to control bleeding. This is normal. Recovery may take up to 3 weeks.  HOME CARE INSTRUCTIONS   Arrange for someone to drive you home after the procedure.  Only take medicines as directed by your health care provider. Do not take aspirin. It can cause bleeding.   Take showers for the first week. Do not take baths, swim, or use hot tubs until your health care provider says it is okay.   Do not douche, use tampons, or have sexual intercourse until your health care provider says it is okay.   Avoid strenuous activities, exercises, and heavy lifting for at least 7-14 days.  You may resume your normal diet unless your health care provider advises you differently.    If you are constipated, you may:  Take a mild laxative as directed by your health care provider.   Add fruit and bran to your diet.   Make sure to drink enough fluids to keep your urine clear or pale yellow.  Keep follow-up  appointments with your health care provider. SEEK MEDICAL CARE IF:   You develop a rash.   You are dizzy or lightheaded.   You feel nauseous.   You develop a bad smelling vaginal discharge. SEEK IMMEDIATE MEDICAL CARE IF:   You have blood clots or bleeding that is heavier than a normal menstrual period (for example, soaking a pad in less than 1 hour) or you develop bright red bleeding.   You have a fever over 101F (38.3C) or persistent symptoms for more than 2-3 days.   You have a fever over 101F (38.3C) and your symptoms suddenly get worse.  You have increasing cramps.   You faint.   You have pain when urinating.  You have bloody urine.   You start vomiting.   Your pain is not relieved with your medicine.   Your have severe or worsening pain. MAKE SURE YOU:  Understand these instructions.  Will watch your condition.  Will get help right away if you are not doing well or get worse.   This information is not intended to replace advice given to you by your health care provider. Make sure you discuss any questions you have with your health care provider.   Document Released: 08/17/2005 Document Revised: 08/22/2013 Document Reviewed: 02/10/2013 Elsevier Interactive Patient Education 2016 [employer].   Can take ibuprofen/motrin/ advil at 9PM  On 3/2 2017 Can [place] a heating pad to abdomen for discomfort  Increase water intake next  48hours

## 2015-10-31 NOTE — Op Note (Signed)
Procedure(s): CONIZATION CERVIX WITH BIOPSY INTRAUTERINE DEVICE (IUD) REMOVAL Procedure Note  Teresa Perez female 40 y.o. 10/31/2015  Procedure(s) and Anesthesia Type:    * CONIZATION CERVIX WITH BIOPSY - General    * INTRAUTERINE DEVICE (IUD) REMOVAL - General  Surgeon(s) and Role:    * Eldred Manges, MD - Primary   Indications:  HGSIL.  IUD in place.  Abnormal uterine bleeding    Surgeon: Kendall Flack P   Assistants: none  Anesthesia: General LMA anesthesia  ASA Class: 3    Procedure Detail:  The patient was taken to the operating room after appropriate identification and placed on the operating table area. After the attainment of adequate general anesthesia. A timeout was performed. The perineum and vagina were prepped with multiple layers of Betadine and a straight catheter used to empty the bladder. The perineum was draped as a sterile field. A weighted speculum was placed in the posterior vagina and a Deaver used to elevate the bladder. A paracervical block was achieved with a total of 10 cc of 2% Xylocaine and the 5 and 7:00 positions on the cervix. A single-tooth tenaculum was placed outside the transition zone.  The ParaGard IUD was removed and discarded. The cervix was infiltrated with a dilute solution of Pitressin for total of 30 cc. Hemostatic sutures were placed at the 3 and 9:00 positions, tied down and then held. Lugol's solution was used to stain the cervix. A cone shaped biopsy was then obtained, including the entire transition zone and excised. A suture was placed at the 12:00 position. The endocervical canal was curetted, producing minimal tissue. The endometrial cavity was curetted producing a small amount of tissue. A Pipelle was used to obtain a suction sample from the endometrium, but was primarily blood.   Hemostatic sutures were placed at the 12 and 6:00 positions on the cervix is for the exocervical tissue. Hemostasis was noted to be adequate. A small piece  of Gelfoam was placed in the conization bed. All instruments were then removed from the vagina. The patient was awakened from general anesthesia and taken to the recovery room in satisfactory condition having tolerated the procedure well with sponge and instrument counts correct.  CONIZATION CERVIX WITH BIOPSY, INTRAUTERINE DEVICE (IUD) REMOVAL  Findings: There were no Lugol's nonstaining areas on the cervix. The endometrial biopsy consisted primarily of blood.  Estimated Blood Loss:  less than 50 mL         Drains: None         Blood Given: none          Specimens: 1.:Cone Biopsy of the cervix with a suture at 12:00            2.: Endocervical curettings            3: Endometrial curettings                 Complications: None         Disposition: PACU - hemodynamically stable. the patient will be discharged home after meeting postanesthesia care parameters.         Condition: stable

## 2015-10-31 NOTE — Anesthesia Preprocedure Evaluation (Signed)
Anesthesia Evaluation  Patient identified by MRN, date of birth, ID band Patient awake    Reviewed: Allergy & Precautions, NPO status , Patient's Chart, lab work & pertinent test results  Airway Mallampati: II  TM Distance: >3 FB Neck ROM: Full    Dental no notable dental hx.    Pulmonary Current Smoker,    Pulmonary exam normal breath sounds clear to auscultation       Cardiovascular (-) hypertensionnegative cardio ROS Normal cardiovascular exam Rhythm:Regular Rate:Normal     Neuro/Psych negative neurological ROS  negative psych ROS   GI/Hepatic negative GI ROS, Neg liver ROS, neg GERD  ,  Endo/Other  neg diabetesMorbid obesity  Renal/GU negative Renal ROS  negative genitourinary   Musculoskeletal negative musculoskeletal ROS (+)   Abdominal   Peds negative pediatric ROS (+)  Hematology negative hematology ROS (+)   Anesthesia Other Findings   Reproductive/Obstetrics negative OB ROS                             Anesthesia Physical Anesthesia Plan  ASA: II  Anesthesia Plan: General   Post-op Pain Management:    Induction: Intravenous  Airway Management Planned: LMA  Additional Equipment:   Intra-op Plan:   Post-operative Plan: Extubation in OR  Informed Consent: I have reviewed the patients History and Physical, chart, labs and discussed the procedure including the risks, benefits and alternatives for the proposed anesthesia with the patient or authorized representative who has indicated his/her understanding and acceptance.   Dental advisory given  Plan Discussed with: CRNA  Anesthesia Plan Comments:         Anesthesia Quick Evaluation

## 2015-11-01 ENCOUNTER — Encounter (HOSPITAL_COMMUNITY): Payer: Self-pay | Admitting: Obstetrics and Gynecology

## 2015-11-05 ENCOUNTER — Ambulatory Visit (INDEPENDENT_AMBULATORY_CARE_PROVIDER_SITE_OTHER): Payer: BLUE CROSS/BLUE SHIELD | Admitting: Physician Assistant

## 2015-11-05 ENCOUNTER — Encounter: Payer: Self-pay | Admitting: Physician Assistant

## 2015-11-05 VITALS — BP 100/68 | HR 101 | Temp 99.4°F | Ht 66.0 in | Wt 265.8 lb

## 2015-11-05 DIAGNOSIS — J208 Acute bronchitis due to other specified organisms: Principal | ICD-10-CM

## 2015-11-05 DIAGNOSIS — J00 Acute nasopharyngitis [common cold]: Secondary | ICD-10-CM | POA: Diagnosis not present

## 2015-11-05 DIAGNOSIS — B9689 Other specified bacterial agents as the cause of diseases classified elsewhere: Secondary | ICD-10-CM

## 2015-11-05 HISTORY — DX: Other specified bacterial agents as the cause of diseases classified elsewhere: B96.89

## 2015-11-05 HISTORY — DX: Acute bronchitis due to other specified organisms: J20.8

## 2015-11-05 MED ORDER — BENZONATATE 100 MG PO CAPS
100.0000 mg | ORAL_CAPSULE | Freq: Two times a day (BID) | ORAL | Status: DC | PRN
Start: 2015-11-05 — End: 2016-09-09

## 2015-11-05 MED ORDER — CLARITHROMYCIN 250 MG PO TABS: 250.0000 mg | ORAL_TABLET | Freq: Two times a day (BID) | ORAL | Status: DC

## 2015-11-05 MED FILL — BENZONATATE 100 MG CAPSULE: 100 | 10 days supply | Qty: 20 | Fill #0

## 2015-11-05 MED FILL — CLARITHROMYCIN 500 MG TAB: 500 | 7 days supply | Qty: 7 | Fill #0

## 2015-11-05 NOTE — Patient Instructions (Signed)
Take antibiotic (Clarithromycin) as directed.  Increase fluids.  Get plenty of rest. Use Mucinex for congestion. Tessalon as directed. Take a daily probiotic (I recommend Align or Culturelle, but even Activia Yogurt may be beneficial).  A humidifier placed in the bedroom may offer some relief for a dry, scratchy throat of nasal irritation.  Read information below on acute bronchitis. Please call or return to clinic if symptoms are not improving.  Acute Bronchitis Bronchitis is when the airways that extend from the windpipe into the lungs get red, puffy, and painful (inflamed). Bronchitis often causes thick spit (mucus) to develop. This leads to a cough. A cough is the most common symptom of bronchitis. In acute bronchitis, the condition usually begins suddenly and goes away over time (usually in 2 weeks). Smoking, allergies, and asthma can make bronchitis worse. Repeated episodes of bronchitis may cause more lung problems.  HOME CARE  Rest.  Drink enough fluids to keep your pee (urine) clear or pale yellow (unless you need to limit fluids as told by your doctor).  Only take over-the-counter or prescription medicines as told by your doctor.  Avoid smoking and secondhand smoke. These can make bronchitis worse. If you are a smoker, think about using nicotine gum or skin patches. Quitting smoking will help your lungs heal faster.  Reduce the chance of getting bronchitis again by:  Washing your hands often.  Avoiding people with cold symptoms.  Trying not to touch your hands to your mouth, nose, or eyes.  Follow up with your doctor as told.  GET HELP IF: Your symptoms do not improve after 1 week of treatment. Symptoms include:  Cough.  Fever.  Coughing up thick spit.  Body aches.  Chest congestion.  Chills.  Shortness of breath.  Sore throat.  GET HELP RIGHT AWAY IF:   You have an increased fever.  You have chills.  You have severe shortness of breath.  You have bloody  thick spit (sputum).  You throw up (vomit) often.  You lose too much body fluid (dehydration).  You have a severe headache.  You faint.  MAKE SURE YOU:   Understand these instructions.  Will watch your condition.  Will get help right away if you are not doing well or get worse. Document Released: 02/03/2008 Document Revised: 04/19/2013 Document Reviewed: 02/07/2013 [facility] Patient Information 2015 [place], [place]. This information is not intended to replace advice given to you by your health care provider. Make sure you discuss any questions you have with your health care provider.

## 2015-11-05 NOTE — Progress Notes (Signed)
Patient presents to clinic today c/o 1 week of cough and chest congestion. Endorses symptoms have been worsening since onset. Cough is now productive of yellow sputum. Endorses some SOB during coughing spells. Is unsure of fever but notes sweats and chills. Is a smoker but is in the process of quitting.  Endorses some mild headaches. Endorses recent use of penicillin.  Past Medical History  Diagnosis Date  . Asthma     childhood asthma  . Anxiety   . Depression     has been treated in past-no present meds  . PCOS (polycystic ovarian syndrome)   . Boil     tendency to form boils  . Hx of migraine headaches   . Headache(784.0)   . Family history of anesthesia complication 6389    son- age 69 - "had lockjaw" after surgery (T&A)  . Anemia   . Hypertension     pt states she was treated with hctz for pre-htn for approx 78mo-has not taken since 07/2010  . Pneumonia     AS A CHILD  . Diabetes mellitus without complication ([place]     PRE DIABETES, NO MEDS FOR LAST SIX MONTHS    Current Outpatient Prescriptions on File Prior to Visit  Medication Sig Dispense Refill  . HALOG 0.1 % OINT Apply 1 application topically 2 (two) times daily.  1  . Iron-FA-B Cmp-C-Biot-Probiotic (FUSION PLUS) CAPS Take 1 capsule by mouth daily.  12  . venlafaxine XR (EFFEXOR XR) 75 MG 24 hr capsule Take 1 capsule (75 mg total) by mouth daily with breakfast. (Patient taking differently: Take 75-150 mg by mouth 2 (two) times daily. 2 capsules in the morning and 1 capsule at bedtime) 30 capsule 0   No current facility-administered medications on file prior to visit.    Allergies  Allergen Reactions  . Latex Rash    Family History  Problem Relation Age of Onset  . Anesthesia problems Son     son-had T&A-2008-had trismus  . Diabetes Mother   . Hypertension Mother   . Hyperlipidemia Mother   . Arthritis Mother   . Hypertension Father   . Hypertension Paternal Grandmother   . Hyperlipidemia Paternal  Grandmother   . Depression Neg Hx     Social History   Social History  . Marital Status: Single    Spouse Name: N/A  . Number of Children: N/A  . Years of Education: N/A   Social History Main Topics  . Smoking status: Current Some Day Smoker -- 0.25 packs/day for 16 years    Types: Cigarettes  . Smokeless tobacco: Never Used  . Alcohol Use: No     Comment: occasional   . Drug Use: No  . Sexual Activity: Yes    Birth Control/ Protection: IUD   Other Topics Concern  . None   Social History Narrative   Review of Systems - See HPI.  All other ROS are negative.  BP 100/68 mmHg  Pulse 101  Temp(Src) 99.4 F (37.4 C) (Oral)  Ht 5' 6"  (1.676 m)  Wt 265 lb 12.8 oz (120.566 kg)  BMI 42.92 kg/m2  SpO2 98%  LMP 10/24/2015 (Exact Date)  Physical Exam  Constitutional: She is oriented to person, place, and time and well-developed, well-nourished, and in no distress.  HENT:  Head: Normocephalic and atraumatic.  Right Ear: External ear normal.  Left Ear: External ear normal.  Nose: Nose normal.  Mouth/Throat: Oropharynx is clear and moist. No oropharyngeal exudate.  TM within  normal limits  Eyes: Conjunctivae are normal.  Neck: Neck supple.  Cardiovascular: Normal rate, regular rhythm, normal heart sounds and intact distal pulses.   Pulmonary/Chest: Effort normal and breath sounds normal. No respiratory distress. She has no wheezes. She has no rales. She exhibits no tenderness.  Lymphadenopathy:    She has no cervical adenopathy.  Neurological: She is alert and oriented to person, place, and time.  Skin: Skin is warm and dry. No rash noted.  Psychiatric: Affect normal.    Recent Results (from the past 2160 hour(s))  Pregnancy, urine     Status: None   Collection Time: 10/31/15 12:20 PM  Result Value Ref Range   Preg Test, Ur NEGATIVE NEGATIVE    Comment:        THE SENSITIVITY OF THIS METHODOLOGY IS >20 mIU/mL.   CBC     Status: Abnormal   Collection Time: 10/31/15  12:25 PM  Result Value Ref Range   WBC 9.1 4.0 - 10.5 K/uL   RBC 5.13 (H) 3.87 - 5.11 MIL/uL   Hemoglobin 11.5 (L) 12.0 - 15.0 g/dL   HCT 35.9 (L) 36.0 - 46.0 %   MCV 70.0 (L) 78.0 - 100.0 fL   MCH 22.4 (L) 26.0 - 34.0 pg   MCHC 32.0 30.0 - 36.0 g/dL   RDW 17.8 (H) 11.5 - 15.5 %   Platelets 240 150 - 400 K/uL    Assessment/Plan: Acute bacterial bronchitis Rx Clarithromycin.  Increase fluids.  Rest.  Saline nasal spray.  Probiotic.  Mucinex as directed.  Humidifier in bedroom. Tessalon per orders.  Call or return to clinic if symptoms are not improving.

## 2015-11-05 NOTE — Progress Notes (Signed)
Pre visit review using our clinic review tool, if applicable. No additional management support is needed unless otherwise documented below in the visit note. 

## 2015-11-05 NOTE — Assessment & Plan Note (Signed)
Rx Clarithromycin.  Increase fluids.  Rest.  Saline nasal spray.  Probiotic.  Mucinex as directed.  Humidifier in bedroom. Tessalon per orders.  Call or return to clinic if symptoms are not improving.

## 2016-01-01 DIAGNOSIS — R7303 Prediabetes: Secondary | ICD-10-CM | POA: Diagnosis not present

## 2016-01-01 DIAGNOSIS — F329 Major depressive disorder, single episode, unspecified: Secondary | ICD-10-CM | POA: Diagnosis not present

## 2016-01-01 DIAGNOSIS — D508 Other iron deficiency anemias: Secondary | ICD-10-CM | POA: Diagnosis not present

## 2016-01-01 DIAGNOSIS — E559 Vitamin D deficiency, unspecified: Secondary | ICD-10-CM | POA: Diagnosis not present

## 2016-01-29 DIAGNOSIS — F331 Major depressive disorder, recurrent, moderate: Secondary | ICD-10-CM | POA: Diagnosis not present

## 2016-06-15 DIAGNOSIS — Z114 Encounter for screening for human immunodeficiency virus [HIV]: Secondary | ICD-10-CM | POA: Diagnosis not present

## 2016-06-15 DIAGNOSIS — Z0000 Encounter for general adult medical examination without abnormal findings: Secondary | ICD-10-CM | POA: Diagnosis not present

## 2016-06-23 DIAGNOSIS — F329 Major depressive disorder, single episode, unspecified: Secondary | ICD-10-CM | POA: Diagnosis not present

## 2016-06-23 DIAGNOSIS — E7800 Pure hypercholesterolemia, unspecified: Secondary | ICD-10-CM | POA: Diagnosis not present

## 2016-08-05 ENCOUNTER — Ambulatory Visit (INDEPENDENT_AMBULATORY_CARE_PROVIDER_SITE_OTHER): Payer: BLUE CROSS/BLUE SHIELD | Admitting: Family Medicine

## 2016-08-05 ENCOUNTER — Other Ambulatory Visit: Payer: Self-pay | Admitting: Family Medicine

## 2016-08-05 ENCOUNTER — Encounter: Payer: Self-pay | Admitting: Family Medicine

## 2016-08-05 VITALS — BP 121/78 | HR 93 | Wt 280.0 lb

## 2016-08-05 DIAGNOSIS — Z3689 Encounter for other specified antenatal screening: Secondary | ICD-10-CM

## 2016-08-05 DIAGNOSIS — Z1151 Encounter for screening for human papillomavirus (HPV): Secondary | ICD-10-CM | POA: Diagnosis not present

## 2016-08-05 DIAGNOSIS — Z98891 History of uterine scar from previous surgery: Secondary | ICD-10-CM

## 2016-08-05 DIAGNOSIS — O09529 Supervision of elderly multigravida, unspecified trimester: Secondary | ICD-10-CM

## 2016-08-05 DIAGNOSIS — O3440 Maternal care for other abnormalities of cervix, unspecified trimester: Secondary | ICD-10-CM | POA: Insufficient documentation

## 2016-08-05 DIAGNOSIS — O09521 Supervision of elderly multigravida, first trimester: Secondary | ICD-10-CM

## 2016-08-05 DIAGNOSIS — O99211 Obesity complicating pregnancy, first trimester: Secondary | ICD-10-CM

## 2016-08-05 DIAGNOSIS — O0990 Supervision of high risk pregnancy, unspecified, unspecified trimester: Secondary | ICD-10-CM | POA: Diagnosis not present

## 2016-08-05 DIAGNOSIS — O3441 Maternal care for other abnormalities of cervix, first trimester: Secondary | ICD-10-CM

## 2016-08-05 DIAGNOSIS — Z124 Encounter for screening for malignant neoplasm of cervix: Secondary | ICD-10-CM

## 2016-08-05 DIAGNOSIS — Z113 Encounter for screening for infections with a predominantly sexual mode of transmission: Secondary | ICD-10-CM | POA: Diagnosis not present

## 2016-08-05 DIAGNOSIS — O34219 Maternal care for unspecified type scar from previous cesarean delivery: Secondary | ICD-10-CM

## 2016-08-05 DIAGNOSIS — Z9889 Other specified postprocedural states: Secondary | ICD-10-CM

## 2016-08-05 LAB — GLUCOSE, RANDOM: Glucose, Bld: 130 mg/dL — ABNORMAL HIGH (ref 65–99)

## 2016-08-05 NOTE — Progress Notes (Signed)
Subjective:    Teresa Perez is a [id number] 74w5dbeing seen today for her first obstetrical visit.  Her obstetrical history is significant for advanced maternal age, obesity and previous cesarean section, and conization of cervix approximately 9 months ago.. Patient does intend to breast feed. Pregnancy history fully reviewed.  Patient reports no complaints.  Vitals:   08/05/16 1558  BP: 121/78  Pulse: 93  Weight: 280 lb (127 kg)    HISTORY: OB History  Gravida Para Term Preterm AB Living  6 5 5  0 0 5  SAB TAB Ectopic Multiple Live Births  0 0 0 0 4    # Outcome Date GA Lbr Len/2nd Weight Sex Delivery Anes PTL Lv  6 Current           5 Term 06/22/13 36w0d6 lb 8.1 oz (2.95 kg) F CS-LTranv EPI  LIV  4 Term 07/03/11 4044w0d lb 15.3 oz (3.155 kg) F CS-LTranv Spinal  LIV     Birth Comments: normal AGA term  3 Term 04/2011    F CS-Unspec Gen  LIV     Birth Comments: fetal distress, placenta abruptio  2 Term 07/1998   8 lb 1 oz (3.657 kg) M Vag-Spont     1 Term 04/1995   8 lb 8 oz (3.856 kg) M CS-Unspec EPI  LIV     Past Medical History:  Diagnosis Date  . Anemia   . Anxiety   . Asthma    childhood asthma  . Boil    tendency to form boils  . Depression    has been treated in past-no present meds  . Diabetes mellitus without complication (HCC)    PRE DIABETES, NO MEDS FOR LAST SIX MONTHS  . Family history of anesthesia complication [id number]- age 13 -22"had lockjaw" after surgery (T&A)  . Headache(784.0)   . Hx of migraine headaches   . Hypertension    pt states she was treated with hctz for pre-htn for approx 23mo53mos not taken since 07/2010  . PCOS (polycystic ovarian syndrome)   . Pneumonia    AS A CHILD   Past Surgical History:  Procedure Laterality Date  . CERVICAL CONIZATION W/BX N/A 10/31/2015   Procedure: CONIZATION CERVIX WITH BIOPSY;  Surgeon: VaneEldred Manges;  [facility];  Service: Gynecology;  Laterality: N/A;  . CESAREAN SECTION     x2 previous   . CESAREAN SECTION  07/03/2011   Procedure: CESAREAN SECTION;  Surgeon: SandAlwyn Pea;  [facility];  Service: Gynecology;  Laterality: N/A;  . CESAREAN SECTION N/A 06/22/2013   Procedure: CESAREAN SECTION, repeat;  Surgeon: VaneEldred Manges;  [facility];  Service: Obstetrics;  Laterality: N/A;  . IUD REMOVAL N/A 10/31/2015   Procedure: INTRAUTERINE DEVICE (IUD) REMOVAL;  Surgeon: VaneEldred Manges;  [facility];  Service: Gynecology;  Laterality: N/A;  . MOUTH SURGERY  2004   Family History  Problem Relation Age of Onset  . Anesthesia problems Son     son-had T&A-2008-had trismus  . Diabetes Mother   . Hypertension Mother   . Hyperlipidemia Mother   . Arthritis Mother   . Hypertension Father   . Hypertension Paternal Grandmother   . Hyperlipidemia Paternal Grandmother   . Depression Neg Hx      Exam    Uterus:     Pelvic Exam:    Perineum: No Hemorrhoids, Normal Perineum   Vulva: normal,  Bartholin's, Urethra, Skene's normal   Vagina:  normal mucosa   Cervix: multiparous appearance   Adnexa: normal adnexa and no mass, fullness, tenderness   Bony Pelvis: gynecoid  System:     Skin: normal coloration and turgor, no rashes    Neurologic: oriented, gait normal; reflexes normal and symmetric   Extremities: normal strength, tone, and muscle mass   HEENT PERRLA and extra ocular movement intact   Mouth/Teeth mucous membranes moist, pharynx normal without lesions   Neck supple and no masses   Cardiovascular: regular rate and rhythm, no murmurs or gallops   Respiratory:  appears well, vitals normal, no respiratory distress, acyanotic, normal RR, ear and throat exam is normal, neck free of mass or lymphadenopathy, chest clear, no wheezing, crepitations, rhonchi, normal symmetric air entry   Abdomen: soft, non-tender; bowel sounds normal; no masses,  no organomegaly   Urinary: urethral meatus normal      Assessment:    Pregnancy: [id number] Patient  Active Problem List   Diagnosis Date Noted  . History of conization of cervix affecting pregnancy, antepartum 08/05/2016  . Acute bacterial bronchitis 11/05/2015  . Abnormal uterine bleeding 10/29/2015  . HGSIL (high grade squamous intraepithelial lesion) on Pap smear of cervix 10/29/2015  . Atypical endocervical cells on Pap smear 10/29/2015  . Menorrhagia 10/29/2015  . HPV test positive 10/29/2015  . Leukocytosis 02/21/2015  . Unspecified vitamin D deficiency 01/09/2014  . Insomnia 01/03/2014  . Depression 01/02/2014  . PCOS (polycystic ovarian syndrome) 01/02/2014  . Anemia 06/24/2013  . Cigarette smoker within last 12 months 06/22/2013  . Severe obesity (BMI >= 40) ([place]) 06/22/2013  . AMA (advanced maternal age) multigravida 41+, unspecified trimester 06/22/2013  . Fasting hyperglycemia 06/22/2013  . Reactive airway disease that is not asthma 06/22/2013  . Previous cesarean section 07/03/2011        Plan:      1. Supervision of high risk pregnancy, antepartum Discussed nature of practice. Will repeat US to confirm dating. PNV Requests genetic testing. - CULTURE, URINE COMPREHENSIVE - Opiate, quantitative, urine - Prenatal (OB Panel) - HIV antibody (with reflex) - Sickle Cell Scr - GC/Chlamydia probe amp ([place])not at [facility] - US OB Comp Less 14 Wks; Future - US Pelvis Complete; Future  2. Antepartum multigravida of advanced maternal age NIPs offerred - drawn today. - CULTURE, URINE COMPREHENSIVE - Opiate, quantitative, urine - Prenatal (OB Panel) - HIV antibody (with reflex) - Sickle Cell Scr - GC/Chlamydia probe amp ([place])not at [facility]  3. Severe obesity (BMI >= 40) (HCC) Early glucose screening.  4. Previous cesarean section x5.  5. History of conization of cervix affecting pregnancy, antepartum Discussed that she is at risk for cervical incompetence.  PAP repeated today.   100% of 45 min visit spent on counseling and coordination of care.      Loma [place] JEHIEL 08/05/2016

## 2016-08-05 NOTE — Progress Notes (Signed)
Bedside ultrasound CRL 2.36  Gestational age [redacted] weeks.  Fetal heart rate 170. Kathrene Alu RNBSN

## 2016-08-06 ENCOUNTER — Ambulatory Visit (HOSPITAL_BASED_OUTPATIENT_CLINIC_OR_DEPARTMENT_OTHER): Admission: RE | Admit: 2016-08-06 | Payer: BLUE CROSS/BLUE SHIELD | Source: Ambulatory Visit

## 2016-08-06 LAB — OBSTETRIC PANEL
Antibody Screen: NEGATIVE
Basophils Absolute: 0 cells/uL (ref 0–200)
Basophils Relative: 0 %
EOS PCT: 2 %
Eosinophils Absolute: 266 cells/uL (ref 15–500)
HEMATOCRIT: 38.1 % (ref 35.0–45.0)
HEMOGLOBIN: 12 g/dL (ref 11.7–15.5)
HEP B S AG: NEGATIVE
LYMPHS ABS: 3724 {cells}/uL (ref 850–3900)
Lymphocytes Relative: 28 %
MCH: 23.6 pg — ABNORMAL LOW (ref 27.0–33.0)
MCHC: 31.5 g/dL — ABNORMAL LOW (ref 32.0–36.0)
MCV: 74.9 fL — ABNORMAL LOW (ref 80.0–100.0)
MONO ABS: 798 {cells}/uL (ref 200–950)
MONOS PCT: 6 %
MPV: 9.3 fL (ref 7.5–12.5)
NEUTROS ABS: 8512 {cells}/uL — AB (ref 1500–7800)
Neutrophils Relative %: 64 %
Platelets: 331 10*3/uL (ref 140–400)
RBC: 5.09 MIL/uL (ref 3.80–5.10)
RDW: 15.8 % — AB (ref 11.0–15.0)
RH TYPE: POSITIVE
WBC: 13.3 10*3/uL — AB (ref 3.8–10.8)

## 2016-08-06 LAB — HEMOGLOBIN A1C
HEMOGLOBIN A1C: 5.7 % — AB (ref <5.7)
Mean Plasma Glucose: 117 mg/dL

## 2016-08-06 LAB — SICKLE CELL SCREEN: SICKLE CELL SCREEN: NEGATIVE

## 2016-08-06 LAB — HIV ANTIBODY (ROUTINE TESTING W REFLEX): HIV: NONREACTIVE

## 2016-08-06 NOTE — Addendum Note (Signed)
Addended by: Phill Myron on: 08/06/2016 09:36 AM   Modules accepted: Orders

## 2016-08-07 ENCOUNTER — Other Ambulatory Visit: Payer: Self-pay | Admitting: Family Medicine

## 2016-08-07 DIAGNOSIS — O0990 Supervision of high risk pregnancy, unspecified, unspecified trimester: Secondary | ICD-10-CM

## 2016-08-07 LAB — GC/CHLAMYDIA PROBE AMP (~~LOC~~) NOT AT ARMC
Chlamydia: NEGATIVE
NEISSERIA GONORRHEA: NEGATIVE

## 2016-08-08 LAB — CULTURE, URINE COMPREHENSIVE

## 2016-08-09 LAB — PAIN MGMT, OPIATES EXP. QN, UR
Codeine: NEGATIVE ng/mL (ref <50)
Hydrocodone: NEGATIVE ng/mL (ref <50)
Hydromorphone: NEGATIVE ng/mL (ref <50)
Morphine: NEGATIVE ng/mL (ref <50)
NORHYDROCODONE: NEGATIVE ng/mL (ref <50)
NOROXYCODONE: NEGATIVE ng/mL (ref <50)
OXYCODONE: NEGATIVE ng/mL (ref <50)
Oxymorphone: NEGATIVE ng/mL (ref <50)

## 2016-08-10 LAB — CYTOLOGY - PAP
DIAGNOSIS: NEGATIVE
HPV 16/18/45 genotyping: NEGATIVE
HPV: DETECTED — AB

## 2016-08-11 ENCOUNTER — Other Ambulatory Visit (INDEPENDENT_AMBULATORY_CARE_PROVIDER_SITE_OTHER): Payer: BLUE CROSS/BLUE SHIELD

## 2016-08-11 DIAGNOSIS — Z3492 Encounter for supervision of normal pregnancy, unspecified, second trimester: Secondary | ICD-10-CM

## 2016-08-11 DIAGNOSIS — Z3490 Encounter for supervision of normal pregnancy, unspecified, unspecified trimester: Secondary | ICD-10-CM | POA: Diagnosis not present

## 2016-08-11 NOTE — Progress Notes (Signed)
Patient in office for two hour gtt. Kathrene Alu RNBSN

## 2016-08-12 ENCOUNTER — Other Ambulatory Visit: Payer: BLUE CROSS/BLUE SHIELD

## 2016-08-12 ENCOUNTER — Ambulatory Visit (HOSPITAL_COMMUNITY)
Admission: RE | Admit: 2016-08-12 | Discharge: 2016-08-12 | Disposition: A | Discharge status: Home / Self Care | Payer: BLUE CROSS/BLUE SHIELD | Source: Ambulatory Visit | Attending: Family Medicine | Admitting: Family Medicine

## 2016-08-12 DIAGNOSIS — O3481 Maternal care for other abnormalities of pelvic organs, first trimester: Secondary | ICD-10-CM | POA: Insufficient documentation

## 2016-08-12 DIAGNOSIS — Z3689 Encounter for other specified antenatal screening: Secondary | ICD-10-CM | POA: Insufficient documentation

## 2016-08-12 DIAGNOSIS — O3680X0 Pregnancy with inconclusive fetal viability, not applicable or unspecified: Secondary | ICD-10-CM | POA: Diagnosis not present

## 2016-08-12 DIAGNOSIS — Z3A12 12 weeks gestation of pregnancy: Secondary | ICD-10-CM | POA: Insufficient documentation

## 2016-08-12 DIAGNOSIS — O0990 Supervision of high risk pregnancy, unspecified, unspecified trimester: Secondary | ICD-10-CM

## 2016-08-12 DIAGNOSIS — N8312 Corpus luteum cyst of left ovary: Secondary | ICD-10-CM | POA: Insufficient documentation

## 2016-08-12 LAB — GLUCOSE TOLERANCE, 2 HOURS W/ 1HR
GLUCOSE, 2 HOUR: 65 mg/dL (ref <140)
GLUCOSE, FASTING: 107 mg/dL — AB (ref 65–99)
Glucose, 1 hour: 154 mg/dL

## 2016-08-17 ENCOUNTER — Encounter: Payer: Self-pay | Admitting: Obstetrics & Gynecology

## 2016-08-17 DIAGNOSIS — O24419 Gestational diabetes mellitus in pregnancy, unspecified control: Secondary | ICD-10-CM | POA: Insufficient documentation

## 2016-08-17 HISTORY — DX: Gestational diabetes mellitus in pregnancy, unspecified control: O24.419

## 2016-08-18 ENCOUNTER — Telehealth: Payer: Self-pay

## 2016-08-18 DIAGNOSIS — O99810 Abnormal glucose complicating pregnancy: Secondary | ICD-10-CM

## 2016-08-18 NOTE — Telephone Encounter (Signed)
-----   Message from Lavonia Drafts, MD sent at 08/17/2016  3:24 PM EST ----- Please call pt. She needs a HgbA1c and a TSH.  Her 2 hour GTT was significant for an abnormal fasting.  Please also get her referred to a nutritionist and diabetic educator so she can begin checking her glucose.  Thx, clh-S

## 2016-08-18 NOTE — Telephone Encounter (Signed)
Left message for patient to return call to office.  Kathrene Alu RNBSN

## 2016-08-18 NOTE — Telephone Encounter (Signed)
Patient made aware of gtt results and need for nutrition and diabetes. Patient made aware to call the office back if she hasn't heard from Nutrition and diabetes for an appointment by end of week.Patient also made aware of Johnsie Cancel results of low risk. Kathrene Alu RNBSN

## 2016-08-19 ENCOUNTER — Encounter: Payer: Self-pay | Admitting: Family Medicine

## 2016-09-07 ENCOUNTER — Encounter: Payer: BLUE CROSS/BLUE SHIELD | Attending: Medical | Admitting: Skilled Nursing Facility1

## 2016-09-07 DIAGNOSIS — Z713 Dietary counseling and surveillance: Secondary | ICD-10-CM | POA: Insufficient documentation

## 2016-09-07 DIAGNOSIS — O99810 Abnormal glucose complicating pregnancy: Secondary | ICD-10-CM | POA: Insufficient documentation

## 2016-09-07 NOTE — Progress Notes (Signed)
  Patient was seen on 09/08/2015 for Gestational Diabetes self-management class at [facility]. The following learning objectives were met by the patient during this course:   States the definition of Gestational Diabetes  States why dietary management is important in controlling blood glucose  Describes the effects each nutrient has on blood glucose levels  Demonstrates ability to create a balanced meal plan  Demonstrates carbohydrate counting   States when to check blood glucose levels involving a total of 4 separate occurences in a day  Demonstrates proper blood glucose monitoring techniques  States the effect of stress and exercise on blood glucose levels  States the importance of limiting caffeine and abstaining from alcohol and smoking  Demonstrates the knowledge the glucometer provided in class may not be covered by their insurance and to call their insurance provider immediately after class to know which glucometer their insurance provider does cover as well as calling their physician the next day for a prescription to the glucometer their insurance does cover (if the one provided is not) as well as the lancets and strips for that meter. Pt states her Son who is 66 helps at home with 4 other children. Pt states she had PCOS. Pt seemed very interested and had a great understanding of the material and was accompanied by the babies father who seemed supportive.   Blood glucose monitor given: contour next Lot # [id number] Exp: 2017-05-30 Blood glucose reading: 98  Patient instructed to monitor glucose levels: FBS: 60 - <90 1 hour: <140 2 hour: <120  *Patient received handouts:  Nutrition Diabetes and Pregnancy  Carbohydrate Counting List  Patient will be seen for follow-up as needed.

## 2016-09-09 ENCOUNTER — Ambulatory Visit (INDEPENDENT_AMBULATORY_CARE_PROVIDER_SITE_OTHER): Payer: BLUE CROSS/BLUE SHIELD | Admitting: Family Medicine

## 2016-09-09 VITALS — BP 129/83 | HR 83 | Wt 280.0 lb

## 2016-09-09 DIAGNOSIS — O0990 Supervision of high risk pregnancy, unspecified, unspecified trimester: Secondary | ICD-10-CM

## 2016-09-09 DIAGNOSIS — O09529 Supervision of elderly multigravida, unspecified trimester: Secondary | ICD-10-CM

## 2016-09-09 DIAGNOSIS — N393 Stress incontinence (female) (male): Secondary | ICD-10-CM

## 2016-09-09 DIAGNOSIS — O24410 Gestational diabetes mellitus in pregnancy, diet controlled: Secondary | ICD-10-CM

## 2016-09-09 DIAGNOSIS — O09522 Supervision of elderly multigravida, second trimester: Secondary | ICD-10-CM

## 2016-09-09 DIAGNOSIS — Z3490 Encounter for supervision of normal pregnancy, unspecified, unspecified trimester: Secondary | ICD-10-CM

## 2016-09-09 DIAGNOSIS — Z98891 History of uterine scar from previous surgery: Secondary | ICD-10-CM

## 2016-09-09 MED ORDER — PREPLUS 27-1 MG PO TABS: 1.0000 | ORAL_TABLET | Freq: Every day | ORAL | 13 refills | Status: DC

## 2016-09-09 MED ORDER — ACCU-CHEK NANO SMARTVIEW W/DEVICE KIT: 1.0000 | PACK | 0 refills | Status: DC

## 2016-09-09 MED ORDER — GLUCOSE BLOOD VI STRP: ORAL_STRIP | 12 refills | Status: DC

## 2016-09-09 MED ORDER — ACCU-CHEK FASTCLIX LANCETS MISC: 1.0000 [IU] | Freq: Four times a day (QID) | 12 refills | Status: DC

## 2016-09-09 MED FILL — ACCU-CHEK NANO SMARTVIEW ME: W/DEVICE | 1 days supply | Qty: 1 | Fill #0

## 2016-09-09 MED FILL — ACCU-CHEK FASTCLIX LANCETS: 25 days supply | Qty: 102 | Fill #0

## 2016-09-09 MED FILL — PRENATAL VITAMIN PLUS LOW I: 27-1 | 30 days supply | Qty: 30 | Fill #0

## 2016-09-09 MED FILL — ACCU-CHEK SMARTVIEW STRIP: 25 days supply | Qty: 100 | Fill #0

## 2016-09-09 NOTE — Progress Notes (Signed)
Subjective:  Teresa Perez is a 41 y.o. G6P5005 at 56w5dbeing seen today for ongoing prenatal care.  She is currently monitored for the following issues for this high-risk pregnancy and has Previous cesarean section; Cigarette smoker within last 12 months; Severe obesity (BMI >= 40) ([place]; Antepartum multigravida of advanced maternal age; Fasting hyperglycemia; Reactive airway disease that is not asthma; Anemia; Depression; PCOS (polycystic ovarian syndrome); Insomnia; Unspecified vitamin D deficiency; Leukocytosis; Abnormal uterine bleeding; Menorrhagia; HPV test positive; Acute bacterial bronchitis; History of conization of cervix affecting pregnancy, antepartum; Supervision of high risk pregnancy, antepartum; and Gestational diabetes mellitus (GDM), antepartum on her problem list.  GDM: Patient diet controlled.  Reports no hypoglycemic episodes.  Tolerating medication well Has DM education. No meter.  Patient reports stress incontinence.  Contractions: Not present. Vag. Bleeding: None.   . Denies leaking of fluid.   The following portions of the patient's history were reviewed and updated as appropriate: allergies, current medications, past family history, past medical history, past social history, past surgical history and problem list. Problem list updated.  Objective:   Vitals:   09/09/16 1413  BP: 129/83  Pulse: 83  Weight: 280 lb (127 kg)    Fetal Status:           General:  Alert, oriented and cooperative. Patient is in no acute distress.  Skin: Skin is warm and dry. No rash noted.   Cardiovascular: Normal heart rate noted  Respiratory: Normal respiratory effort, no problems with respiration noted  Abdomen: Soft, gravid, appropriate for gestational age. Pain/Pressure: Absent     Pelvic: Vag. Bleeding: None Vag D/C Character: Thin   Cervical exam deferred        Extremities: Normal range of motion.  Edema: None  Mental Status: Normal mood and affect. Normal behavior. Normal  judgment and thought content.   Urinalysis:      Assessment and Plan:  Pregnancy: G6P5005 at 136w5d1. Prenatal care, antepartum FHT and FH normal - [place] OB COMP + 14 WK; Future  2. Supervision of high risk pregnancy, antepartum  3. Antepartum multigravida of advanced maternal age   4.80Previous cesarean section  5. Diet controlled gestational diabetes mellitus (GDM), antepartum Meter and test strips prescribed. F/u in 2 weeks  6. Stress inconinence  Referral to pelvic floor PT.  Preterm labor symptoms and general obstetric precautions including but not limited to vaginal bleeding, contractions, leaking of fluid and fetal movement were reviewed in detail with the patient. Please refer to After Visit Summary for other counseling recommendations.  No Follow-up on file.   JaTruett MainlandDO

## 2016-09-10 ENCOUNTER — Encounter: Payer: BLUE CROSS/BLUE SHIELD | Admitting: Obstetrics & Gynecology

## 2016-09-14 ENCOUNTER — Ambulatory Visit: Payer: BLUE CROSS/BLUE SHIELD

## 2016-09-17 ENCOUNTER — Encounter (HOSPITAL_COMMUNITY): Payer: Self-pay | Admitting: Family Medicine

## 2016-09-23 ENCOUNTER — Ambulatory Visit (INDEPENDENT_AMBULATORY_CARE_PROVIDER_SITE_OTHER): Payer: BLUE CROSS/BLUE SHIELD | Admitting: Family Medicine

## 2016-09-23 VITALS — BP 120/73 | HR 80 | Wt 276.0 lb

## 2016-09-23 DIAGNOSIS — O24410 Gestational diabetes mellitus in pregnancy, diet controlled: Secondary | ICD-10-CM

## 2016-09-23 DIAGNOSIS — O0990 Supervision of high risk pregnancy, unspecified, unspecified trimester: Secondary | ICD-10-CM

## 2016-09-23 NOTE — Progress Notes (Signed)
Subjective:  Teresa Perez is a 41 y.o. G6P5005 at 24w5dbeing seen today for ongoing prenatal care.  She is currently monitored for the following issues for this high-risk pregnancy and has Previous cesarean section; Cigarette smoker within last 12 months; Severe obesity (BMI >= 40) ([place]; Antepartum multigravida of advanced maternal age; Fasting hyperglycemia; Reactive airway disease that is not asthma; Anemia; Depression; PCOS (polycystic ovarian syndrome); Insomnia; Unspecified vitamin D deficiency; Leukocytosis; Abnormal uterine bleeding; Menorrhagia; HPV test positive; Acute bacterial bronchitis; History of conization of cervix affecting pregnancy, antepartum; Supervision of high risk pregnancy, antepartum; and Gestational diabetes mellitus (GDM), antepartum on her problem list.  GDM: Patient diet controlled.  Reports no hypoglycemic episodes. Has been checking irregularly. States that morning blood sugars have been high. She had another book that she was logging the blood sugars in, but lost it. Has new book, which only has a few values. Fasting: two values over past 5 days, both elevated: 97, 115 2hr PP: 1-2 elevated out of 6-8 values.  Patient reports no complaints.  Contractions: Not present. Vag. Bleeding: Scant.  Movement: Present. Denies leaking of fluid.   The following portions of the patient's history were reviewed and updated as appropriate: allergies, current medications, past family history, past medical history, past social history, past surgical history and problem list. Problem list updated.  Objective:   Vitals:   09/23/16 1526  BP: 120/73  Pulse: 80  Weight: 276 lb (125.2 kg)    Fetal Status: Fetal Heart Rate (bpm): 152   Movement: Present     General:  Alert, oriented and cooperative. Patient is in no acute distress.  Skin: Skin is warm and dry. No rash noted.   Cardiovascular: Normal heart rate noted  Respiratory: Normal respiratory effort, no problems with respiration  noted  Abdomen: Soft, gravid, appropriate for gestational age. Pain/Pressure: Present     Pelvic: Vag. Bleeding: Scant Vag D/C Character: Thin   Cervical exam deferred        Extremities: Normal range of motion.  Edema: None  Mental Status: Normal mood and affect. Normal behavior. Normal judgment and thought content.   Urinalysis:      Assessment and Plan:  Pregnancy: G6P5005 at 157w5d1. Supervision of high risk pregnancy, antepartum FH and FHT normal  2. Diet controlled gestational diabetes mellitus (GDM), antepartum Discussed checking more regularly. Note given for patient's work. Will recheck in 2 weeks. Patient to check blood sugars 1 hr PP with goal of < 140.  Preterm labor symptoms and general obstetric precautions including but not limited to vaginal bleeding, contractions, leaking of fluid and fetal movement were reviewed in detail with the patient. Please refer to After Visit Summary for other counseling recommendations.  No Follow-up on file.   JaTruett MainlandDO

## 2016-09-24 ENCOUNTER — Encounter (HOSPITAL_COMMUNITY): Payer: Self-pay

## 2016-09-24 ENCOUNTER — Other Ambulatory Visit: Payer: Self-pay | Admitting: Family Medicine

## 2016-09-24 ENCOUNTER — Ambulatory Visit: Payer: BLUE CROSS/BLUE SHIELD | Attending: Family Medicine | Admitting: Physical Therapy

## 2016-09-24 ENCOUNTER — Encounter: Payer: Self-pay | Admitting: Physical Therapy

## 2016-09-24 ENCOUNTER — Ambulatory Visit (HOSPITAL_COMMUNITY)
Admission: RE | Admit: 2016-09-24 | Discharge: 2016-09-24 | Disposition: A | Discharge status: Home / Self Care | Payer: BLUE CROSS/BLUE SHIELD | Source: Ambulatory Visit | Attending: Family Medicine | Admitting: Family Medicine

## 2016-09-24 VITALS — BP 118/77 | HR 77 | Wt 276.8 lb

## 2016-09-24 DIAGNOSIS — Z3490 Encounter for supervision of normal pregnancy, unspecified, unspecified trimester: Secondary | ICD-10-CM

## 2016-09-24 DIAGNOSIS — O24419 Gestational diabetes mellitus in pregnancy, unspecified control: Secondary | ICD-10-CM | POA: Diagnosis not present

## 2016-09-24 DIAGNOSIS — M6281 Muscle weakness (generalized): Secondary | ICD-10-CM | POA: Diagnosis not present

## 2016-09-24 DIAGNOSIS — Z363 Encounter for antenatal screening for malformations: Secondary | ICD-10-CM | POA: Diagnosis not present

## 2016-09-24 DIAGNOSIS — O09522 Supervision of elderly multigravida, second trimester: Secondary | ICD-10-CM | POA: Diagnosis not present

## 2016-09-24 DIAGNOSIS — R279 Unspecified lack of coordination: Secondary | ICD-10-CM | POA: Diagnosis not present

## 2016-09-24 DIAGNOSIS — Z3A18 18 weeks gestation of pregnancy: Secondary | ICD-10-CM

## 2016-09-24 DIAGNOSIS — Z3492 Encounter for supervision of normal pregnancy, unspecified, second trimester: Secondary | ICD-10-CM | POA: Diagnosis present

## 2016-09-24 NOTE — Patient Instructions (Addendum)
Sitting    Sit comfortably. Allow body's muscles to relax. Place hands on belly. Inhale slowly and deeply for _3__ seconds, so hands move out. Then take _3__ seconds to exhale. Repeat _10__ times. Do _3__ times a day.  Copyright  VHI. All rights reserved.  Sitting    Sit comfortably. Allow body's muscles to relax. Place hands on belly. Inhale slowly and deeply for ___ seconds, so hands move out. Then take ___ seconds to exhale. Repeat ___ times. Do ___ times a day.  Copyright  VHI. All rights reserved.  Quick Contraction: Gravity Resisted (Sitting)    Sitting, quickly squeeze then fully relax pelvic floor. Perform _1__ sets of _5__. Rest for _1__ seconds between sets. Do _4__ times a day.  Copyright  VHI. All rights reserved.  Slow Contraction: Gravity Resisted (Sitting)    Sitting, slowly squeeze pelvic floor for _10__ seconds. Rest for _5__ seconds. Repeat _10__ times. Do __4_ times a day.  Copyright  VHI. All rights reserved.  [facility] [address] [place], [place] [address] Phone # [phone] Fax [phone]

## 2016-09-24 NOTE — Therapy (Signed)
[facility] [address] [place], [place], [address] Phone: [phone]   Fax:  [phone]  Physical Therapy Evaluation  Patient Details  Name: Teresa Perez MRN: [id number] Date of Birth: 24-Sep-1975 Referring Provider: Dr. Loma Boston  Encounter Date: 09/24/2016      PT End of Session - 09/24/16 1249    Visit Number 1   Date for PT Re-Evaluation 12/17/16   PT Start Time 1155   PT Stop Time 1240   PT Time Calculation (min) 45 min   Activity Tolerance Patient tolerated treatment well   Behavior During Therapy [facility] for tasks assessed/performed      Past Medical History:  Diagnosis Date  . Anemia   . Anxiety   . Asthma    childhood asthma  . Boil    tendency to form boils  . Depression    has been treated in past-no present meds  . Diabetes mellitus without complication (HCC)    PRE DIABETES, NO MEDS FOR LAST SIX MONTHS  . Family history of anesthesia complication 2505   son- age 25 - "had lockjaw" after surgery (T&A)  . Gestational diabetes   . Headache(784.0)   . Hx of migraine headaches   . Hypertension    pt states she was treated with hctz for pre-htn for approx 56mo-has not taken since 07/2010  . PCOS (polycystic ovarian syndrome)   . Pneumonia    AS A CHILD    Past Surgical History:  Procedure Laterality Date  . CERVICAL CONIZATION W/BX N/A 10/31/2015   Procedure: CONIZATION CERVIX WITH BIOPSY;  Surgeon: VEldred Manges MD;  [facility];  Service: Gynecology;  Laterality: N/A;  . CESAREAN SECTION     x2 previous  . CESAREAN SECTION  07/03/2011   Procedure: CESAREAN SECTION;  Surgeon: SAlwyn Pea MD;  [facility];  Service: Gynecology;  Laterality: N/A;  . CESAREAN SECTION N/A 06/22/2013   Procedure: CESAREAN SECTION, repeat;  Surgeon: VEldred Manges MD;  [facility];  Service: Obstetrics;  Laterality: N/A;  . IUD REMOVAL N/A 10/31/2015   Procedure: INTRAUTERINE DEVICE  (IUD) REMOVAL;  Surgeon: VEldred Manges MD;  [facility];  Service: Gynecology;  Laterality: N/A;  . MOUTH SURGERY  2004    There were no vitals filed for this visit.       Subjective Assessment - 09/24/16 1210    Subjective Patient reports she will go to the bathroom and have wet panties.  When she sneezes she will leak urine. Worse in the last 2 years.  Patient has 5 children and 1 vaginally. Patient has increased in pad thickness.    Patient Stated Goals learn what she can do about being incontinent.    Currently in Pain? No/denies            [facility] - 09/24/16 0001      Assessment   Medical Diagnosis N39.3 Stress incontinence   Referring Provider Dr. JLoma Boston  Onset Date/Surgical Date 09/24/14   Prior Therapy None     Precautions   Precautions Other (comment)   Precaution Comments Pregnancy-18 weeks     Restrictions   Weight Bearing Restrictions No     Balance Screen   Has the patient fallen in the past 6 months No   Has the patient had a decrease in activity level because of a fear of falling?  No   Is the patient reluctant to leave their  home because of a fear of falling?  No     Home Ecologist residence     Prior Function   Level of Independence Independent   Vocation Full time employment   Vocation Requirements sitting   Leisure no exercise     Cognition   Overall Cognitive Status Within Functional Limits for tasks assessed     Observation/Other Assessments   Focus on Therapeutic Outcomes (FOTO)  48% limitation  goal is 39% limitation     Posture/Postural Control   Posture/Postural Control Postural limitations   Postural Limitations --  pregnant posture     Palpation   SI assessment  right ilium rotated posteriorly      Transfers   Transfers Not assessed     Ambulation/Gait   Ambulation/Gait No                 Pelvic Floor Special Questions - 09/24/16 0001    Prior  Pregnancies Yes   Number of Pregnancies 6   Number of C-Sections 4   Number of Vaginal Deliveries 1   Urinary Leakage Yes   Pad use 2 pads   thick   Activities that cause leaking Coughing;Sneezing;Laughing;With strong urge   Urinary urgency Yes   Urinary frequency 4 to 5 times per day   Fecal incontinence No   Skin Integrity Intact   Pelvic Floor Internal Exam Patient confirms identification and confirms therapist to assess pelvic floor muscle strength in the intoritus only   Exam Type Vaginal   Palpation patient will bear down with laugh; Patient held breath with pelvic floor contraction   Strength weak squeeze, no lift  no lift, just squeeze   Strength # of reps 10   Strength # of seconds 10                  PT Education - 09/24/16 1246    Education provided Yes   Education Details not holding her breath; pelvic floor exercises; diaphgramatic breathing   Person(s) Educated Patient   Methods Explanation;Demonstration;Handout;Verbal cues   Comprehension Returned demonstration;Verbalized understanding          PT Short Term Goals - 09/24/16 1316      PT SHORT TERM GOAL #1   Title independent with initial HEP for pelvic floor contraction   Time 4   Period Weeks   Status New     PT SHORT TERM GOAL #2   Title urinary leakage decreased >/= 25%   Time 4   Period Weeks   Status New     PT SHORT TERM GOAL #3   Title understand how to breath and not hold her breath with activites to reduce leakage   Time 4   Period Weeks   Status New           PT Long Term Goals - 09/24/16 1319      PT LONG TERM GOAL #1   Title independent with HEP    Time 12   Period Weeks   Status New     PT LONG TERM GOAL #2   Title urinary leakage decreased >/= 60% with activity due to increased strength   Time 12   Period Weeks   Status New     PT LONG TERM GOAL #3   Title understand how bladder irritants affect urinary leakage and the importance of drinking water to stay  hydrated   Time 12   Period Weeks   Status  New     PT LONG TERM GOAL #4   Title urge to urinate decreased >/= 50% so she is able to hold her urine to get to the bathroom   Time 12   Period Weeks   Status New     PT LONG TERM GOAL #5   Title FOTO score is </= 39% limitation   Time 12   Period Weeks   Status New     Additional Long Term Goals   Additional Long Term Goals Yes     PT LONG TERM GOAL #6   Title walking or ride recumbent bike 30-40 minutes 5 times per week to increase overall stength and decreased urinary leakage   Time 12   Period Weeks   Status New               Plan - 09/24/16 1300    Clinical Impression Statement Patient is a 41 year old female with stress incontinence for the past 2 years.  Patient is presently [redacted] weeks pregnant.  Pelvic floor strength is 2/5 with no lift.  Patient will bear down on the pelvic floor  when she laughs. Bilateral hip abduction strength is 4/5. Patient wears 2 thick pads per day.  Patient gets a rash from wearing the pads that become wet from urinary leakage. Patient is low complex evaluation deu to a stable condition and comorbidities including pregnancy that could impact care provided. Patient will benefit  from phsyical therapy to improve strength to reduce leakage.    Rehab Potential Excellent   Clinical Impairments Affecting Rehab Potential presently pregnant   PT Frequency 1x / week   PT Duration 12 weeks   PT Treatment/Interventions Biofeedback;Therapeutic activities;Therapeutic exercise;Neuromuscular re-education;Patient/family education   PT Next Visit Plan pelvic floor exercise with hip and core strengthening; lifting with correct breathing; pelvic floor strength, bladder irritants   PT Home Exercise Plan progress as needed   Recommended Other Services None   Consulted and Agree with Plan of Care Patient      Patient will benefit from skilled therapeutic intervention in order to improve the following deficits and  impairments:  Decreased strength, Decreased coordination  Visit Diagnosis: Muscle weakness (generalized) - Plan: PT plan of care cert/re-cert  Unspecified lack of coordination - Plan: PT plan of care cert/re-cert     Problem List Patient Active Problem List   Diagnosis Date Noted  . Gestational diabetes mellitus (GDM), antepartum 08/17/2016  . History of conization of cervix affecting pregnancy, antepartum 08/05/2016  . Supervision of high risk pregnancy, antepartum 08/05/2016  . Acute bacterial bronchitis 11/05/2015  . Abnormal uterine bleeding 10/29/2015  . Menorrhagia 10/29/2015  . HPV test positive 10/29/2015  . Leukocytosis 02/21/2015  . Unspecified vitamin D deficiency 01/09/2014  . Insomnia 01/03/2014  . Depression 01/02/2014  . PCOS (polycystic ovarian syndrome) 01/02/2014  . Anemia 06/24/2013  . Cigarette smoker within last 12 months 06/22/2013  . Severe obesity (BMI >= 40) ([place]) 06/22/2013  . Antepartum multigravida of advanced maternal age 31/23/2014  . Fasting hyperglycemia 06/22/2013  . Reactive airway disease that is not asthma 06/22/2013  . Previous cesarean section 07/03/2011    Earlie Counts, PT 09/24/16 1:26 PM   [facility] [address], [place], [place], [address] Phone: [phone]   Fax:  [phone]  Name: Teresa Perez MRN: [id number] Date of Birth: 1976-03-25

## 2016-09-30 ENCOUNTER — Encounter: Payer: BLUE CROSS/BLUE SHIELD | Admitting: Physical Therapy

## 2016-10-01 ENCOUNTER — Encounter: Payer: Self-pay | Admitting: Physical Therapy

## 2016-10-01 ENCOUNTER — Ambulatory Visit: Payer: BLUE CROSS/BLUE SHIELD | Attending: Family Medicine | Admitting: Physical Therapy

## 2016-10-01 DIAGNOSIS — R279 Unspecified lack of coordination: Secondary | ICD-10-CM | POA: Insufficient documentation

## 2016-10-01 DIAGNOSIS — M6281 Muscle weakness (generalized): Secondary | ICD-10-CM | POA: Diagnosis not present

## 2016-10-01 NOTE — Therapy (Signed)
[facility] [address] [place], [place], [address] Phone: [phone]   Fax:  [phone]  Physical Therapy Treatment  Patient Details  Name: Teresa Perez MRN: [id number] Date of Birth: 03/07/1976 Referring Provider: Dr. Loma Boston  Encounter Date: 10/01/2016      PT End of Session - 10/01/16 0928    Visit Number 2   Date for PT Re-Evaluation 12/17/16   PT Start Time 0258  came late due to being lost   PT Stop Time 0930   PT Time Calculation (min) 33 min   Activity Tolerance Patient tolerated treatment well   Behavior During Therapy [facility] for tasks assessed/performed      Past Medical History:  Diagnosis Date  . Anemia   . Anxiety   . Asthma    childhood asthma  . Boil    tendency to form boils  . Depression    has been treated in past-no present meds  . Diabetes mellitus without complication (HCC)    PRE DIABETES, NO MEDS FOR LAST SIX MONTHS  . Family history of anesthesia complication 5277   son- age 29 - "had lockjaw" after surgery (T&A)  . Gestational diabetes   . Headache(784.0)   . Hx of migraine headaches   . Hypertension    pt states she was treated with hctz for pre-htn for approx 13mo-has not taken since 07/2010  . PCOS (polycystic ovarian syndrome)   . Pneumonia    AS A CHILD    Past Surgical History:  Procedure Laterality Date  . CERVICAL CONIZATION W/BX N/A 10/31/2015   Procedure: CONIZATION CERVIX WITH BIOPSY;  Surgeon: VEldred Manges MD;  [facility];  Service: Gynecology;  Laterality: N/A;  . CESAREAN SECTION     x2 previous  . CESAREAN SECTION  07/03/2011   Procedure: CESAREAN SECTION;  Surgeon: SAlwyn Pea MD;  [facility];  Service: Gynecology;  Laterality: N/A;  . CESAREAN SECTION N/A 06/22/2013   Procedure: CESAREAN SECTION, repeat;  Surgeon: VEldred Manges MD;  [facility];  Service: Obstetrics;  Laterality: N/A;  . IUD REMOVAL N/A 10/31/2015    Procedure: INTRAUTERINE DEVICE (IUD) REMOVAL;  Surgeon: VEldred Manges MD;  [facility];  Service: Gynecology;  Laterality: N/A;  . MOUTH SURGERY  2004    There were no vitals filed for this visit.      Subjective Assessment - 10/01/16 0859    Subjective No questions on the information from last visit. I feel like I have less leakage.    Patient Stated Goals learn what she can do about being incontinent.    Currently in Pain? No/denies   Multiple Pain Sites No                      Pelvic Floor Special Questions - 10/01/16 0001    Biofeedback resting tone 1.5 uv; Need to place hands on inner thighs to islolate contraction and breath; focused on contracting above 4 uv and min is 0 uv   Biofeedback sensor type Surface  vaginal   Biofeedback Activity Quick contraction;10 second hold                   PT Education - 10/01/16 0924    Education provided Yes   Education Details pelvic floor contraction in sitting with 10 sec hold and quick flicks   Person(s) Educated Patient   Methods Explanation;Demonstration;Verbal cues;Handout   Comprehension Returned  demonstration;Verbalized understanding          PT Short Term Goals - 10/01/16 0900      PT SHORT TERM GOAL #1   Title independent with initial HEP for pelvic floor contraction   Time 4   Period Weeks   Status Achieved     PT SHORT TERM GOAL #2   Title urinary leakage decreased >/= 25%   Time 4   Period Weeks   Status Achieved     PT SHORT TERM GOAL #3   Title understand how to breath and not hold her breath with activites to reduce leakage   Time 4   Period Weeks   Status On-going           PT Long Term Goals - 09/24/16 1319      PT LONG TERM GOAL #1   Title independent with HEP    Time 12   Period Weeks   Status New     PT LONG TERM GOAL #2   Title urinary leakage decreased >/= 60% with activity due to increased strength   Time 12   Period Weeks   Status New     PT  LONG TERM GOAL #3   Title understand how bladder irritants affect urinary leakage and the importance of drinking water to stay hydrated   Time 12   Period Weeks   Status New     PT LONG TERM GOAL #4   Title urge to urinate decreased >/= 50% so she is able to hold her urine to get to the bathroom   Time 12   Period Weeks   Status New     PT LONG TERM GOAL #5   Title FOTO score is </= 39% limitation   Time 12   Period Weeks   Status New     Additional Long Term Goals   Additional Long Term Goals Yes     PT LONG TERM GOAL #6   Title walking or ride recumbent bike 30-40 minutes 5 times per week to increase overall stength and decreased urinary leakage   Time 12   Period Weeks   Status New               Plan - 10/01/16 5462    Clinical Impression Statement Patient able to relax the pelvic floor to 1.5 uv.  She is able to contract the pelvic floor but has difficulty with contracting above 5uv. Patient will need verbal cues to breath with pelvic floor contraction. Patient is down 1 pad.  Patient reports her urinary leakage is 25% better.  Patient will benefit from skilled therapy to improve strength to reduce leakaga.    Rehab Potential Excellent   Clinical Impairments Affecting Rehab Potential presently pregnant   PT Frequency 1x / week   PT Duration 12 weeks   PT Treatment/Interventions Biofeedback;Therapeutic activities;Therapeutic exercise;Neuromuscular re-education;Patient/family education   PT Next Visit Plan pelvic floor exercise with hip and core strengthening; lifting with correct breathing; pelvic floor strength   PT Home Exercise Plan progress as needed   Consulted and Agree with Plan of Care Patient      Patient will benefit from skilled therapeutic intervention in order to improve the following deficits and impairments:  Decreased strength, Decreased coordination  Visit Diagnosis: Muscle weakness (generalized)  Unspecified lack of  coordination     Problem List Patient Active Problem List   Diagnosis Date Noted  . Gestational diabetes mellitus (GDM), antepartum 08/17/2016  . History  of conization of cervix affecting pregnancy, antepartum 08/05/2016  . Supervision of high risk pregnancy, antepartum 08/05/2016  . Acute bacterial bronchitis 11/05/2015  . Abnormal uterine bleeding 10/29/2015  . Menorrhagia 10/29/2015  . HPV test positive 10/29/2015  . Leukocytosis 02/21/2015  . Unspecified vitamin D deficiency 01/09/2014  . Insomnia 01/03/2014  . Depression 01/02/2014  . PCOS (polycystic ovarian syndrome) 01/02/2014  . Anemia 06/24/2013  . Cigarette smoker within last 12 months 06/22/2013  . Severe obesity (BMI >= 40) ([place]) 06/22/2013  . Antepartum multigravida of advanced maternal age 10/23/2012  . Fasting hyperglycemia 06/22/2013  . Reactive airway disease that is not asthma 06/22/2013  . Previous cesarean section 07/03/2011    Earlie Counts, PT 10/01/16 11:01 AM   [facility] [address] [place], [place], [address] Phone: [phone]   Fax:  [phone]  Name: Teresa Perez MRN: [id number] Date of Birth: February 12, 1976

## 2016-10-01 NOTE — Patient Instructions (Addendum)
Certain foods and liquids will decrease the pH making the urine more acidic.  Urinary urgency increases when the urine has a low pH.  Most common irritants: alcohol, carbonated beverages and caffinated beverages.  Foods to avoid: apple juice, apples, ascorbic acid, canteloupes, chili, citrus fruits, coffee, cranberries, grapes, guava, peaches, pepper, pineapple, plums, strawberries, tea, tomatoes, and vinegar.  Drinking plenty of water may help to increase the pH and dilute out any of the effects of specific irritants.  Foods that are NOT irritating to the bladder include: Pears, papayas, sun-brewed teas, watermelons, non-citrus herbal teas, apricots, kava and low-acid instant drinks ([employer])  Quick Contraction: Gravity Resisted (Sitting)    Sitting, quickly squeeze then fully relax pelvic floor. Perform __1_ sets of _10__. Rest for __1_ seconds between sets. Do _4__ times a day. Make sure you are breathing Copyright  VHI. All rights reserved.  Slow Contraction: Gravity Resisted (Sitting)    Sitting, slowly squeeze pelvic floor while placing hands on inner thigh for resistance for _10__ seconds. Rest for __10_ seconds. Repeat _10__ times. Do _4__ times a day. Make sure you are breathing.  Copyright  VHI. All rights reserved.  [facility] [address] [place], [place] [address] Phone # [phone] Fax [phone]

## 2016-10-05 ENCOUNTER — Ambulatory Visit (INDEPENDENT_AMBULATORY_CARE_PROVIDER_SITE_OTHER): Payer: BLUE CROSS/BLUE SHIELD | Admitting: Family Medicine

## 2016-10-05 VITALS — BP 129/78 | HR 80 | Wt 273.0 lb

## 2016-10-05 DIAGNOSIS — F32A Depression, unspecified: Secondary | ICD-10-CM

## 2016-10-05 DIAGNOSIS — F329 Major depressive disorder, single episode, unspecified: Secondary | ICD-10-CM

## 2016-10-05 DIAGNOSIS — O99342 Other mental disorders complicating pregnancy, second trimester: Secondary | ICD-10-CM

## 2016-10-05 DIAGNOSIS — O24415 Gestational diabetes mellitus in pregnancy, controlled by oral hypoglycemic drugs: Secondary | ICD-10-CM

## 2016-10-05 DIAGNOSIS — O0990 Supervision of high risk pregnancy, unspecified, unspecified trimester: Secondary | ICD-10-CM

## 2016-10-05 MED ORDER — CITALOPRAM HYDROBROMIDE 20 MG PO TABS: 20.0000 mg | ORAL_TABLET | Freq: Every day | ORAL | 12 refills | Status: DC

## 2016-10-05 MED ORDER — GLYBURIDE 5 MG PO TABS: 5.0000 mg | ORAL_TABLET | Freq: Every day | ORAL | 2 refills | Status: DC

## 2016-10-05 MED FILL — glyBURIDE 5 MG TABS: 5 | 30 days supply | Qty: 30 | Fill #0

## 2016-10-05 MED FILL — CITALOPRAM HBR 20 MG TABLET: 20 | 30 days supply | Qty: 30 | Fill #0

## 2016-10-05 NOTE — Progress Notes (Signed)
Subjective:  Teresa Perez is a 41 y.o. G6P5005 at 82w3dbeing seen today for ongoing prenatal care.  She is currently monitored for the following issues for this high-risk pregnancy and has Previous cesarean section; Cigarette smoker within last 12 months; Severe obesity (BMI >= 40) ([place]; Antepartum multigravida of advanced maternal age; Fasting hyperglycemia; Reactive airway disease that is not asthma; Anemia; Depression; PCOS (polycystic ovarian syndrome); Insomnia; Unspecified vitamin D deficiency; Leukocytosis; Abnormal uterine bleeding; Menorrhagia; HPV test positive; Acute bacterial bronchitis; History of conization of cervix affecting pregnancy, antepartum; Supervision of high risk pregnancy, antepartum; and Gestational diabetes mellitus (GDM), antepartum on her problem list.  GDM: Patient diet controlled at this point.  Reports no hypoglycemic episodes.  Tolerating medication well Fasting: 110-120s 2hr PP: 110-140s.  Patient reports anxiety. Her son was involved in an MVA while DUI. Was on lexapro and klonopin in the past for this. Would like to restart something..  Contractions: Not present. Vag. Bleeding: None.  Movement: Present. Denies leaking of fluid.   The following portions of the patient's history were reviewed and updated as appropriate: allergies, current medications, past family history, past medical history, past social history, past surgical history and problem list. Problem list updated.  Objective:   Vitals:   10/05/16 0922  BP: 129/78  Pulse: 80  Weight: 273 lb (123.8 kg)    Fetal Status: Fetal Heart Rate (bpm): 150   Movement: Present     General:  Alert, oriented and cooperative. Patient is in no acute distress.  Skin: Skin is warm and dry. No rash noted.   Cardiovascular: Normal heart rate noted  Respiratory: Normal respiratory effort, no problems with respiration noted  Abdomen: Soft, gravid, appropriate for gestational age. Pain/Pressure: Present     Pelvic:  Vag. Bleeding: None Vag D/C Character: Thin   Cervical exam deferred        Extremities: Normal range of motion.  Edema: None  Mental Status: Normal mood and affect. Normal behavior. Normal judgment and thought content.   Urinalysis: Urine Protein: Trace Urine Glucose: Negative  Assessment and Plan:  Pregnancy: G6P5005 at 276w3d1. Supervision of high risk pregnancy, antepartum FHT and FH normal  2. Gestational diabetes mellitus (GDM) controlled on oral hypoglycemic drug, antepartum Start glyburide 63m56mt bedtime. Encouraged appropriate checking.  3. Depression during pregnancy in second trimester Start celexa.  Preterm labor symptoms and general obstetric precautions including but not limited to vaginal bleeding, contractions, leaking of fluid and fetal movement were reviewed in detail with the patient. Please refer to After Visit Summary for other counseling recommendations.  Return in about 2 weeks (around 10/19/2016) for OB f/u.   JacTruett MainlandO

## 2016-10-08 ENCOUNTER — Encounter: Payer: BLUE CROSS/BLUE SHIELD | Admitting: Physical Therapy

## 2016-10-08 ENCOUNTER — Encounter: Payer: BLUE CROSS/BLUE SHIELD | Admitting: Family Medicine

## 2016-10-16 ENCOUNTER — Ambulatory Visit: Payer: BLUE CROSS/BLUE SHIELD | Admitting: Physical Therapy

## 2016-10-16 ENCOUNTER — Encounter: Payer: Self-pay | Admitting: Physical Therapy

## 2016-10-16 DIAGNOSIS — R279 Unspecified lack of coordination: Secondary | ICD-10-CM

## 2016-10-16 DIAGNOSIS — M6281 Muscle weakness (generalized): Secondary | ICD-10-CM | POA: Diagnosis not present

## 2016-10-16 NOTE — Patient Instructions (Signed)
Lower abdominal/core stability exercises  1. Practice your breathing technique: Inhale through your nose expanding your belly and rib cage. Try not to breathe into your chest. Exhale slowly and gradually out your mouth feeling a sense of softness to your body. Practice multiple times. This can be performed unlimited.  2. Finding the lower abdominals. Laying on your back with the knees bent, place your fingers just below your belly button. Using your breathing technique from above, on your exhale gently pull the belly button away from your fingertips without tensing any other muscles. Practice this 5x. Next, as you exhale, draw belly button inwards and hold onto it...then feel as if you are pulling that muscle across your pelvis like you are tightening a belt. This can be hard to do at first so be patient and practice. Do 5-10 reps 1-3 x day. Always recognize quality over quantity; if your abdominal muscles become tired you will notice you may tighten/contract other muscles. This is the time to take a break.   Practice this first laying on your back, then in sitting, progressing to standing and finally adding it to all your daily movements.   3. Finding your pelvic floor. Using the breathing technique above, when your exhale, this time draw your pelvic floor muscles up as if you were attempting to stop the flow of urination. Be careful NOT to tense any other muscles. This can be hard, BE PATIENT. Try to hold up to 10 seconds repeating 10x. Try 2x a day. Once you feel you are doing this well, add this contraction to exercise #2. First contracting your pelvic floor followed by lower abdominals.  4. Adding leg movements. Add the following leg movements to challenge your ability to keep your core stable:  1. Single leg drop outs: Laying on your back with knees bent feet flat. Inhale,  dropping one knee outward KEEPING YOUR PELVIS STILL. Exhale as you bring the leg back, simultaneously performing your lower  abdominal contraction. Do 5-10 on each leg.  2. Marching: While keeping your pelvis still, lift the right foot a few inches, put it down then lift left foot. This will mimic a march. Start slow to establish control. Once you have control you may speed it up. Do 10-20x. You MUST keep your lower abdominlas contracted while you march. Breathe naturally   3. Single leg slides: Inhale while you slowly slide one leg out keeping your pelvis still. Only slide your leg as far as you can keep your pelvis still. Exhale as you bring the leg back to the start, contracting the lower abdominals as you do that. Keep your upper body relaxed. Do 5-10 on each side. Lower abdominal/core stability exercises  1. Practice your breathing technique: Inhale through your nose expanding your belly and rib cage. Try not to breathe into your chest. Exhale slowly and gradually out your mouth feeling a sense of softness to your body. Practice multiple times. This can be performed unlimited.  2. Finding the lower abdominals. Laying on your back with the knees bent, place your fingers just below your belly button. Using your breathing technique from above, on your exhale gently pull the belly button away from your fingertips without tensing any other muscles. Practice this 5x. Next, as you exhale, draw belly button inwards and hold onto it...then feel as if you are pulling that muscle across your pelvis like you are tightening a belt. This can be hard to do at first so be patient and practice. Do 5-10  reps 1-3 x day. Always recognize quality over quantity; if your abdominal muscles become tired you will notice you may tighten/contract other muscles. This is the time to take a break.   Practice this first laying on your back, then in sitting, progressing to standing and finally adding it to all your daily movements.   3. Finding your pelvic floor. Using the breathing technique above, when your exhale, this time draw your pelvic floor  muscles up as if you were attempting to stop the flow of urination. Be careful NOT to tense any other muscles. This can be hard, BE PATIENT. Try to hold up to 10 seconds repeating 10x. Try 2x a day. Once you feel you are doing this well, add this contraction to exercise #2. First contracting your pelvic floor followed by lower abdominals.   4. Adding leg movements. Add the following leg movements to challenge your ability to keep your core stable:  1. Single leg drop outs: Laying on your back with knees bent feet flat. Inhale,  dropping one knee outward KEEPING YOUR PELVIS STILL. Exhale as you bring the leg back, simultaneously performing your lower abdominal contraction. Do 5-10 on each leg.   2. Marching: While keeping your pelvis still, lift the right foot a few inches, put it down then lift left foot. This will mimic a march. Start slow to establish control. Once you have control you may speed it up. Do 10-20x. You MUST keep your lower abdominlas contracted while you march. Breathe naturally    3. Single leg slides: Inhale while you slowly slide one leg out keeping your pelvis still. Only slide your leg as far as you can keep your pelvis still. Exhale as you bring the leg back to the start, contracting the lower abdominals as you do that. Keep your upper body relaxed. Do 5-10 on each side.        Flexor, Half-Kneeling    Kneel, one knee on flat surface, opposite foot is FLAT!! Not like in the picture. Trunk erect torso forward, shift weight to front leg. Feel stretch in front of thigh on back leg and same-side hip. Hold _3 breaths.  Repeat _2__ times per session. Do _1-2__ sessions per day.  Copyright  VHI. All rights reserved.   Isometric Hold (Quadruped)   On hands and knees, slowly inhale, and then exhale. Pull navel toward spine and Hold for 3 breaths. Continue to breathe in and out during hold. Rest for __5_ seconds. Repeat _5__ times. Do _1__ times a day.   Copyright  VHI.  All rights reserved.  Bracing With Arm Raise (Quadruped)   On hands and knees find neutral spine. Tighten pelvic floor and abdominals and hold. Alternately lift arm to shoulder level. Repeat __3-5_ times. Do ___1 times a day.   Copyright  VHI. All rights reserved.   Marland Kitchen

## 2016-10-16 NOTE — Therapy (Addendum)
[facility] [address] [place], [place], [address] Phone: [phone]   Fax:  [phone]  Physical Therapy Treatment  Patient Details  Name: Teresa Perez MRN: [id number] Date of Birth: 12-Apr-1976 Referring Provider: Dr. Loma Boston  Encounter Date: 10/16/2016      PT End of Session - 10/16/16 0804    Visit Number 3   Date for PT Re-Evaluation 12/17/16   PT Start Time 0800   PT Stop Time 0845   PT Time Calculation (min) 45 min   Activity Tolerance Patient tolerated treatment well   Behavior During Therapy [facility] for tasks assessed/performed      Past Medical History:  Diagnosis Date  . Anemia   . Anxiety   . Asthma    childhood asthma  . Boil    tendency to form boils  . Depression    has been treated in past-no present meds  . Diabetes mellitus without complication (HCC)    PRE DIABETES, NO MEDS FOR LAST SIX MONTHS  . Family history of anesthesia complication 7939   son- age 10 - "had lockjaw" after surgery (T&A)  . Gestational diabetes   . Headache(784.0)   . Hx of migraine headaches   . Hypertension    pt states she was treated with hctz for pre-htn for approx 60mo-has not taken since 07/2010  . PCOS (polycystic ovarian syndrome)   . Pneumonia    AS A CHILD    Past Surgical History:  Procedure Laterality Date  . CERVICAL CONIZATION W/BX N/A 10/31/2015   Procedure: CONIZATION CERVIX WITH BIOPSY;  Surgeon: VEldred Manges MD;  [facility];  Service: Gynecology;  Laterality: N/A;  . CESAREAN SECTION     x2 previous  . CESAREAN SECTION  07/03/2011   Procedure: CESAREAN SECTION;  Surgeon: SAlwyn Pea MD;  [facility];  Service: Gynecology;  Laterality: N/A;  . CESAREAN SECTION N/A 06/22/2013   Procedure: CESAREAN SECTION, repeat;  Surgeon: VEldred Manges MD;  [facility];  Service: Obstetrics;  Laterality: N/A;  . IUD REMOVAL N/A 10/31/2015   Procedure: INTRAUTERINE DEVICE  (IUD) REMOVAL;  Surgeon: VEldred Manges MD;  [facility];  Service: Gynecology;  Laterality: N/A;  . MOUTH SURGERY  2004    There were no vitals filed for this visit.      Subjective Assessment - 10/16/16 0804    Subjective No complaints this AM.    Currently in Pain? No/denies   Multiple Pain Sites No                         [facility] Adult PT Treatment/Exercise - 10/16/16 0001      Lumbar Exercises: Supine   Ab Set 5 reps;3 seconds  Pt semireclined: Adductor.PF/TA contraction   Other Supine Lumbar Exercises Semireclined TA with leg movements                PT Education - 10/16/16 0821    Education provided Yes   Education Details HEP: semireclined TA series, Hip flexor stretching,    Person(s) Educated Patient   Methods Explanation;Demonstration;Tactile cues;Verbal cues;Handout   Comprehension Verbalized understanding;Returned demonstration          PT Short Term Goals - 10/16/16 0805      PT SHORT TERM GOAL #3   Title understand how to breath and not hold her breath with activites to reduce leakage   Time 4  Period Weeks   Status Achieved           PT Long Term Goals - 09/24/16 1319      PT LONG TERM GOAL #1   Title independent with HEP    Time 12   Period Weeks   Status New     PT LONG TERM GOAL #2   Title urinary leakage decreased >/= 60% with activity due to increased strength   Time 12   Period Weeks   Status New     PT LONG TERM GOAL #3   Title understand how bladder irritants affect urinary leakage and the importance of drinking water to stay hydrated   Time 12   Period Weeks   Status New     PT LONG TERM GOAL #4   Title urge to urinate decreased >/= 50% so she is able to hold her urine to get to the bathroom   Time 12   Period Weeks   Status New     PT LONG TERM GOAL #5   Title FOTO score is </= 39% limitation   Time 12   Period Weeks   Status New     Additional Long Term Goals   Additional Long  Term Goals Yes     PT LONG TERM GOAL #6   Title walking or ride recumbent bike 30-40 minutes 5 times per week to increase overall stength and decreased urinary leakage   Time 12   Period Weeks   Status New               Plan - 10/16/16 6160    Clinical Impression Statement Added to Pt's HEP this week to include exercises for core & hip strength, pelvic floor awareness ( knowing when to relax or if she IS relaxed) .  Pt demonstrated the ability to contract her core including the pelvic floor  well during her exercises.    Rehab Potential Excellent   Clinical Impairments Affecting Rehab Potential presently pregnant   PT Frequency 1x / week   PT Duration 12 weeks   PT Treatment/Interventions Biofeedback;Therapeutic activities;Therapeutic exercise;Neuromuscular re-education;Patient/family education   PT Next Visit Plan pelvic floor exercise with hip and core strengthening; lifting with correct breathing; pelvic floor strength   Consulted and Agree with Plan of Care Patient      Patient will benefit from skilled therapeutic intervention in order to improve the following deficits and impairments:  Decreased strength, Decreased coordination  Visit Diagnosis: Muscle weakness (generalized)  Unspecified lack of coordination     Problem List Patient Active Problem List   Diagnosis Date Noted  . Gestational diabetes mellitus (GDM), antepartum 08/17/2016  . History of conization of cervix affecting pregnancy, antepartum 08/05/2016  . Supervision of high risk pregnancy, antepartum 08/05/2016  . Acute bacterial bronchitis 11/05/2015  . Abnormal uterine bleeding 10/29/2015  . Menorrhagia 10/29/2015  . HPV test positive 10/29/2015  . Leukocytosis 02/21/2015  . Unspecified vitamin D deficiency 01/09/2014  . Insomnia 01/03/2014  . Depression 01/02/2014  . PCOS (polycystic ovarian syndrome) 01/02/2014  . Anemia 06/24/2013  . Cigarette smoker within last 12 months 06/22/2013  .  Severe obesity (BMI >= 40) ([place]) 06/22/2013  . Antepartum multigravida of advanced maternal age 36/23/2014  . Fasting hyperglycemia 06/22/2013  . Reactive airway disease that is not asthma 06/22/2013  . Previous cesarean section 07/03/2011    Teresa Perez, PTA 10/16/2016, 8:48 AM  [facility] [address] [place],  [place], [address] Phone: [phone]   Fax:  [phone]  Name: Morris Markham MRN: [id number] Date of Birth: 1975-10-24  PHYSICAL THERAPY DISCHARGE SUMMARY  Visits from Start of Care: 4  Current functional level related to goals / functional outcomes: Unable to assess patient due to her not returning to therapy.  Patient called on 10/29/2016 to be discharged.  She is not able to come to therapy because of the time she has to take from work.    Remaining deficits: See above.    Education / Equipment: HEP Plan: Patient agrees to discharge.  Patient goals were not met. Patient is being discharged due to the patient's request.  Thank you for the referral.  Earlie Counts, PT 10/29/16 2:38 PM  ?????

## 2016-10-22 ENCOUNTER — Encounter: Payer: BLUE CROSS/BLUE SHIELD | Admitting: Family Medicine

## 2016-10-22 ENCOUNTER — Other Ambulatory Visit (HOSPITAL_COMMUNITY): Payer: Self-pay | Admitting: *Deleted

## 2016-10-22 ENCOUNTER — Encounter (HOSPITAL_COMMUNITY): Payer: Self-pay

## 2016-10-22 ENCOUNTER — Encounter: Payer: BLUE CROSS/BLUE SHIELD | Admitting: Physical Therapy

## 2016-10-22 ENCOUNTER — Ambulatory Visit (HOSPITAL_COMMUNITY)
Admission: RE | Admit: 2016-10-22 | Discharge: 2016-10-22 | Disposition: A | Discharge status: Home / Self Care | Payer: BLUE CROSS/BLUE SHIELD | Source: Ambulatory Visit | Attending: Family Medicine | Admitting: Family Medicine

## 2016-10-22 ENCOUNTER — Other Ambulatory Visit (HOSPITAL_COMMUNITY): Payer: Self-pay | Admitting: Obstetrics and Gynecology

## 2016-10-22 DIAGNOSIS — Z3686 Encounter for antenatal screening for cervical length: Secondary | ICD-10-CM

## 2016-10-22 DIAGNOSIS — Z0489 Encounter for examination and observation for other specified reasons: Secondary | ICD-10-CM

## 2016-10-22 DIAGNOSIS — O99332 Smoking (tobacco) complicating pregnancy, second trimester: Secondary | ICD-10-CM | POA: Diagnosis not present

## 2016-10-22 DIAGNOSIS — O34211 Maternal care for low transverse scar from previous cesarean delivery: Secondary | ICD-10-CM | POA: Diagnosis not present

## 2016-10-22 DIAGNOSIS — IMO0002 Reserved for concepts with insufficient information to code with codable children: Secondary | ICD-10-CM

## 2016-10-22 DIAGNOSIS — O09522 Supervision of elderly multigravida, second trimester: Secondary | ICD-10-CM

## 2016-10-22 DIAGNOSIS — O3442 Maternal care for other abnormalities of cervix, second trimester: Secondary | ICD-10-CM | POA: Diagnosis not present

## 2016-10-22 DIAGNOSIS — O24419 Gestational diabetes mellitus in pregnancy, unspecified control: Secondary | ICD-10-CM

## 2016-10-22 DIAGNOSIS — O24119 Pre-existing diabetes mellitus, type 2, in pregnancy, unspecified trimester: Secondary | ICD-10-CM

## 2016-10-22 DIAGNOSIS — O24415 Gestational diabetes mellitus in pregnancy, controlled by oral hypoglycemic drugs: Secondary | ICD-10-CM | POA: Diagnosis not present

## 2016-10-22 DIAGNOSIS — Z3A22 22 weeks gestation of pregnancy: Secondary | ICD-10-CM | POA: Diagnosis not present

## 2016-10-29 ENCOUNTER — Ambulatory Visit (INDEPENDENT_AMBULATORY_CARE_PROVIDER_SITE_OTHER): Payer: Self-pay | Admitting: Family Medicine

## 2016-10-29 VITALS — BP 118/76 | HR 79 | Wt 278.0 lb

## 2016-10-29 DIAGNOSIS — O0990 Supervision of high risk pregnancy, unspecified, unspecified trimester: Secondary | ICD-10-CM

## 2016-10-29 DIAGNOSIS — O34219 Maternal care for unspecified type scar from previous cesarean delivery: Secondary | ICD-10-CM

## 2016-10-29 DIAGNOSIS — O24415 Gestational diabetes mellitus in pregnancy, controlled by oral hypoglycemic drugs: Secondary | ICD-10-CM

## 2016-10-29 DIAGNOSIS — Z98891 History of uterine scar from previous surgery: Secondary | ICD-10-CM

## 2016-10-29 MED ORDER — GLYBURIDE 5 MG PO TABS: 7.5000 mg | ORAL_TABLET | Freq: Every day | ORAL | 2 refills | Status: DC

## 2016-10-29 NOTE — Progress Notes (Signed)
Subjective:  Teresa Perez is a 41 y.o. G6P5005 at 45w6dbeing seen today for ongoing prenatal care.  She is currently monitored for the following issues for this high-risk pregnancy and has Previous cesarean section; Cigarette smoker within last 12 months; Severe obesity (BMI >= 40) ([place]; Antepartum multigravida of advanced maternal age; Fasting hyperglycemia; Reactive airway disease that is not asthma; Anemia; Depression; PCOS (polycystic ovarian syndrome); Insomnia; Unspecified vitamin D deficiency; Leukocytosis; Abnormal uterine bleeding; Menorrhagia; HPV test positive; Acute bacterial bronchitis; History of conization of cervix affecting pregnancy, antepartum; Supervision of high risk pregnancy, antepartum; and Gestational diabetes mellitus (GDM), antepartum on her problem list.  GDM: Patient taking glyburide 555mat night.  Reports no hypoglycemic episodes.  Tolerating medication well She is checking her blood sugars 1 hr after eating. Fasting: 80-117 2hr PP: 108-140  Patient reports no complaints.  Contractions: Not present. Vag. Bleeding: None.  Movement: Present. Denies leaking of fluid.   The following portions of the patient's history were reviewed and updated as appropriate: allergies, current medications, past family history, past medical history, past social history, past surgical history and problem list. Problem list updated.  Objective:   Vitals:   10/29/16 1000  BP: 118/76  Pulse: 79  Weight: 278 lb (126.1 kg)    Fetal Status: Fetal Heart Rate (bpm): 146   Movement: Present     General:  Alert, oriented and cooperative. Patient is in no acute distress.  Skin: Skin is warm and dry. No rash noted.   Cardiovascular: Normal heart rate noted  Respiratory: Normal respiratory effort, no problems with respiration noted  Abdomen: Soft, gravid, appropriate for gestational age. Pain/Pressure: Present     Pelvic: Vag. Bleeding: None Vag D/C Character: Thin   Cervical exam deferred         Extremities: Normal range of motion.  Edema: None  Mental Status: Normal mood and affect. Normal behavior. Normal judgment and thought content.   Urinalysis:      Assessment and Plan:  Pregnancy: G6P5005 at 2367w6d. Supervision of high risk pregnancy, antepartum FHT and FH normal  2. Gestational diabetes mellitus (GDM) controlled on oral hypoglycemic drug, antepartum Increase glyburide to 7.5mg68m evening.  3. Previous cesarean section repeat  Preterm labor symptoms and general obstetric precautions including but not limited to vaginal bleeding, contractions, leaking of fluid and fetal movement were reviewed in detail with the patient. Please refer to After Visit Summary for other counseling recommendations.  No Follow-up on file.   JacoTruett Mainland

## 2016-10-30 ENCOUNTER — Encounter: Payer: BLUE CROSS/BLUE SHIELD | Admitting: Physical Therapy

## 2016-11-05 ENCOUNTER — Encounter: Payer: BLUE CROSS/BLUE SHIELD | Admitting: Physical Therapy

## 2016-11-12 ENCOUNTER — Ambulatory Visit (INDEPENDENT_AMBULATORY_CARE_PROVIDER_SITE_OTHER): Payer: BLUE CROSS/BLUE SHIELD | Admitting: Obstetrics & Gynecology

## 2016-11-12 VITALS — BP 129/77 | HR 76 | Wt 280.0 lb

## 2016-11-12 DIAGNOSIS — O34219 Maternal care for unspecified type scar from previous cesarean delivery: Secondary | ICD-10-CM

## 2016-11-12 DIAGNOSIS — O24415 Gestational diabetes mellitus in pregnancy, controlled by oral hypoglycemic drugs: Secondary | ICD-10-CM

## 2016-11-12 DIAGNOSIS — Z98891 History of uterine scar from previous surgery: Secondary | ICD-10-CM

## 2016-11-12 DIAGNOSIS — O0990 Supervision of high risk pregnancy, unspecified, unspecified trimester: Secondary | ICD-10-CM

## 2016-11-12 DIAGNOSIS — Z87891 Personal history of nicotine dependence: Secondary | ICD-10-CM

## 2016-11-12 DIAGNOSIS — O99212 Obesity complicating pregnancy, second trimester: Secondary | ICD-10-CM

## 2016-11-12 NOTE — Progress Notes (Signed)
   PRENATAL VISIT NOTE  Subjective:  Teresa Perez is a 41 y.o. G6P5005 at 72w6dbeing seen today for ongoing prenatal care.  She is currently monitored for the following issues for this high-risk pregnancy and has Previous cesarean section; Cigarette smoker within last 12 months; Severe obesity (BMI >= 40) ([place]; Antepartum multigravida of advanced maternal age; Fasting hyperglycemia; Reactive airway disease that is not asthma; Anemia; Depression; PCOS (polycystic ovarian syndrome); Insomnia; Unspecified vitamin D deficiency; Leukocytosis; Abnormal uterine bleeding; Menorrhagia; HPV test positive; Acute bacterial bronchitis; History of conization of cervix affecting pregnancy, antepartum; Supervision of high risk pregnancy, antepartum; and Gestational diabetes mellitus (GDM), antepartum on her problem list.  Patient reports no complaints.  Contractions: Not present. Vag. Bleeding: None.  Movement: Present. Denies leaking of fluid.   The following portions of the patient's history were reviewed and updated as appropriate: allergies, current medications, past family history, past medical history, past social history, past surgical history and problem list. Problem list updated.  Objective:   Vitals:   11/12/16 0856  BP: 129/77  Pulse: 76  Weight: 280 lb (127 kg)    Fetal Status: Fetal Heart Rate (bpm): 150   Movement: Present     General:  Alert, oriented and cooperative. Patient is in no acute distress.  Skin: Skin is warm and dry. No rash noted.   Cardiovascular: Normal heart rate noted  Respiratory: Normal respiratory effort, no problems with respiration noted  Abdomen: Soft, gravid, appropriate for gestational age. Pain/Pressure: Present     Pelvic:  Cervical exam deferred        Extremities: Normal range of motion.  Edema: None  Mental Status: Normal mood and affect. Normal behavior. Normal judgment and thought content.   Assessment and Plan:  Pregnancy: [id number] 263w6d1.  Supervision of high risk pregnancy, antepartum  2. Gestational diabetes mellitus (GDM) controlled on oral hypoglycemic drug, antepartum Glucose well controlled on Glyburie 7.9m49mHS.  Fasting glc well managed at present  3. Severe obesity (BMI >= 40) (HCC)  4. Cigarette smoker within last 12 months  5. Previous cesarean section Requests scheduled repeat at 39 weeks  Preterm labor symptoms and general obstetric precautions including but not limited to vaginal bleeding, contractions, leaking of fluid and fetal movement were reviewed in detail with the patient. Please refer to After Visit Summary for other counseling recommendations.  Return in about 3 weeks (around 12/03/2016).   CarLavonia DraftsD

## 2016-11-12 NOTE — Patient Instructions (Signed)
Tips for Eating Away From Home If You Have Diabetes Controlling your level of blood glucose, also known as blood sugar, can be challenging. It can be even more difficult when you do not prepare your own meals. The following tips can help you manage your diabetes when you eat away from home. Planning ahead Plan ahead if you know you will be eating away from home:  Ask your health care provider how to time meals and medicine if you are taking insulin.  Make a list of restaurants near you that offer healthy choices. If they have a carry-out menu, take it home and plan what you will order ahead of time.  Look up the restaurant you want to eat at online. Many chain and fast-food restaurants list nutritional information online. Use this information to choose the healthiest options and to calculate how many carbohydrates will be in your meal.  Use a carbohydrate-counting book or mobile app to look up the carbohydrate content and serving size of the foods you want to eat.  Become familiar with serving sizes and learn to recognize how many servings are in a portion. This will allow you to estimate how many carbohydrates you can eat. Free foods A "free food" is any food or drink that has less than 5 g of carbohydrates per serving. Free foods include:  Many vegetables.  Hard boiled eggs.  Nuts or seeds.  Olives.  Cheeses.  Meats. These types of foods make good appetizer choices and are often available at salad bars. Lemon juice, vinegar, or a low-calorie salad dressing of fewer than 20 calories per serving can be used as a "free" salad dressing. Choices to reduce carbohydrates  Substitute nonfat sweetened yogurt with a sugar-free yogurt. Yogurt made from soy milk may also be used, but you will still want a sugar-free or plain option to choose a lower carbohydrate amount.  Ask your server to take away the bread basket or chips from your table.  Order fresh fruit. A salad bar often offers fresh  fruit choices. Avoid canned fruit because it is usually packed in sugar or syrup.  Order a salad, and eat it without dressing. Or, create a "free" salad dressing.  Ask for substitutions. For example, instead of [place] fries, request an order of a vegetable such as salad, green beans, or broccoli. Other tips  If you take insulin, take the insulin once your food arrives to your table. This will ensure your insulin and food are timed correctly.  Ask your server about the portion size before your order, and ask for a take-out box if the portion has more servings than you should have. When your food comes, leave the amount you should have on the plate, and put the rest in the take-out box.  Consider splitting an entree with someone and ordering a side salad. This information is not intended to replace advice given to you by your health care provider. Make sure you discuss any questions you have with your health care provider. Document Released: 08/17/2005 Document Revised: 01/23/2016 Document Reviewed: 11/14/2013 Elsevier Interactive Patient Education  2017 [employer].

## 2016-11-13 ENCOUNTER — Encounter: Payer: BLUE CROSS/BLUE SHIELD | Admitting: Physical Therapy

## 2016-11-20 ENCOUNTER — Encounter (HOSPITAL_COMMUNITY): Payer: Self-pay | Admitting: Family Medicine

## 2016-11-25 ENCOUNTER — Ambulatory Visit (HOSPITAL_COMMUNITY)
Admission: RE | Admit: 2016-11-25 | Discharge: 2016-11-25 | Disposition: A | Discharge status: Home / Self Care | Payer: BLUE CROSS/BLUE SHIELD | Source: Ambulatory Visit | Attending: Family Medicine | Admitting: Family Medicine

## 2016-11-25 ENCOUNTER — Other Ambulatory Visit (HOSPITAL_COMMUNITY): Payer: Self-pay | Admitting: Maternal and Fetal Medicine

## 2016-11-25 ENCOUNTER — Encounter (HOSPITAL_COMMUNITY): Payer: Self-pay

## 2016-11-25 DIAGNOSIS — O99212 Obesity complicating pregnancy, second trimester: Secondary | ICD-10-CM | POA: Diagnosis not present

## 2016-11-25 DIAGNOSIS — Z3A27 27 weeks gestation of pregnancy: Secondary | ICD-10-CM

## 2016-11-25 DIAGNOSIS — O24415 Gestational diabetes mellitus in pregnancy, controlled by oral hypoglycemic drugs: Secondary | ICD-10-CM | POA: Diagnosis not present

## 2016-11-25 DIAGNOSIS — O34211 Maternal care for low transverse scar from previous cesarean delivery: Secondary | ICD-10-CM | POA: Insufficient documentation

## 2016-11-25 DIAGNOSIS — O24119 Pre-existing diabetes mellitus, type 2, in pregnancy, unspecified trimester: Secondary | ICD-10-CM

## 2016-11-25 DIAGNOSIS — O3442 Maternal care for other abnormalities of cervix, second trimester: Secondary | ICD-10-CM | POA: Diagnosis not present

## 2016-11-25 DIAGNOSIS — O99332 Smoking (tobacco) complicating pregnancy, second trimester: Secondary | ICD-10-CM | POA: Diagnosis not present

## 2016-11-25 DIAGNOSIS — O09522 Supervision of elderly multigravida, second trimester: Secondary | ICD-10-CM

## 2016-11-25 DIAGNOSIS — O24410 Gestational diabetes mellitus in pregnancy, diet controlled: Secondary | ICD-10-CM | POA: Diagnosis not present

## 2016-11-26 ENCOUNTER — Other Ambulatory Visit (HOSPITAL_COMMUNITY): Payer: Self-pay | Admitting: *Deleted

## 2016-11-26 DIAGNOSIS — O24415 Gestational diabetes mellitus in pregnancy, controlled by oral hypoglycemic drugs: Secondary | ICD-10-CM

## 2016-12-03 ENCOUNTER — Ambulatory Visit (INDEPENDENT_AMBULATORY_CARE_PROVIDER_SITE_OTHER): Payer: BLUE CROSS/BLUE SHIELD | Admitting: Family Medicine

## 2016-12-03 VITALS — BP 132/64 | HR 85 | Wt 280.0 lb

## 2016-12-03 DIAGNOSIS — O24415 Gestational diabetes mellitus in pregnancy, controlled by oral hypoglycemic drugs: Secondary | ICD-10-CM | POA: Diagnosis not present

## 2016-12-03 DIAGNOSIS — O0991 Supervision of high risk pregnancy, unspecified, first trimester: Secondary | ICD-10-CM

## 2016-12-03 DIAGNOSIS — Z23 Encounter for immunization: Secondary | ICD-10-CM | POA: Diagnosis not present

## 2016-12-03 DIAGNOSIS — O0990 Supervision of high risk pregnancy, unspecified, unspecified trimester: Secondary | ICD-10-CM | POA: Diagnosis not present

## 2016-12-03 DIAGNOSIS — Z98891 History of uterine scar from previous surgery: Secondary | ICD-10-CM

## 2016-12-03 MED ORDER — TETANUS-DIPHTH-ACELL PERTUSSIS 5-2.5-18.5 LF-MCG/0.5 IM SUSP: 0.5000 mL | Freq: Once | INTRAMUSCULAR | Status: DC

## 2016-12-03 MED FILL — ACCU-CHEK SMARTVIEW STRIP: 25 days supply | Qty: 100 | Fill #1

## 2016-12-03 MED FILL — PRENATAL VITAMIN PLUS LOW I: 27-1 | 30 days supply | Qty: 30 | Fill #1

## 2016-12-03 MED FILL — ACCU-CHEK FASTCLIX LANCETS: 25 days supply | Qty: 102 | Fill #1

## 2016-12-03 NOTE — Progress Notes (Signed)
Subjective:  Teresa Perez is a 41 y.o. G6P5005 at 88w6dbeing seen today for ongoing prenatal care.  She is currently monitored for the following issues for this high-risk pregnancy and has Previous cesarean section; Cigarette smoker within last 12 months; Severe obesity (BMI >= 40) ([place]; Antepartum multigravida of advanced maternal age; Fasting hyperglycemia; Reactive airway disease that is not asthma; Anemia; Depression; PCOS (polycystic ovarian syndrome); Insomnia; Unspecified vitamin D deficiency; Leukocytosis; Abnormal uterine bleeding; Menorrhagia; HPV test positive; Acute bacterial bronchitis; History of conization of cervix affecting pregnancy, antepartum; Supervision of high risk pregnancy, antepartum; and Gestational diabetes mellitus (GDM), antepartum on her problem list.  GDM: Patient taking Glyburide 570mqhs. She was taking 7.48m70mhs, but was walking up feeling weak, sweaty, nauseated and lower blood sugars. She has decreased it back to 48mg90md has improved her diet. She did not bring her log book, but reports blood sugars <90 fasting and within range on her 1hr PP checks. She left her meter at a friend's house a week ago, but should be getting it soon.  Patient reports no complaints.  Contractions: Not present. Vag. Bleeding: None.  Movement: Present. Denies leaking of fluid.   The following portions of the patient's history were reviewed and updated as appropriate: allergies, current medications, past family history, past medical history, past social history, past surgical history and problem list. Problem list updated.  Objective:   Vitals:   12/03/16 0842  BP: 132/64  Pulse: 85  Weight: 280 lb (127 kg)    Fetal Status: Fetal Heart Rate (bpm): 153   Movement: Present     General:  Alert, oriented and cooperative. Patient is in no acute distress.  Skin: Skin is warm and dry. No rash noted.   Cardiovascular: Normal heart rate noted  Respiratory: Normal respiratory effort, no  problems with respiration noted  Abdomen: Soft, gravid, appropriate for gestational age. Pain/Pressure: Present     Pelvic: Vag. Bleeding: None Vag D/C Character: Thin   Cervical exam deferred        Extremities: Normal range of motion.  Edema: None  Mental Status: Normal mood and affect. Normal behavior. Normal judgment and thought content.   Urinalysis: Urine Protein: Negative Urine Glucose: Trace  Assessment and Plan:  Pregnancy: G6P5005 at 28w690w6dSupervision of high risk pregnancy, antepartum FHT normal.  - HIV antibody (with reflex) - CBC - RPR  2. Gestational diabetes mellitus (GDM) controlled on oral hypoglycemic drug, antepartum EFW good on [place] week. Check blood sugars. - HIV antibody (with reflex) - CBC - RPR  3. Previous cesarean section Will schedule repeat with BTL  Preterm labor symptoms and general obstetric precautions including but not limited to vaginal bleeding, contractions, leaking of fluid and fetal movement were reviewed in detail with the patient. Please refer to After Visit Summary for other counseling recommendations.  No Follow-up on file.   JacobTruett Mainland

## 2016-12-04 LAB — CBC
HEMATOCRIT: 34.5 % (ref 34.0–46.6)
Hemoglobin: 11 g/dL — ABNORMAL LOW (ref 11.1–15.9)
MCH: 24.9 pg — AB (ref 26.6–33.0)
MCHC: 31.9 g/dL (ref 31.5–35.7)
MCV: 78 fL — AB (ref 79–97)
Platelets: 294 10*3/uL (ref 150–379)
RBC: 4.41 x10E6/uL (ref 3.77–5.28)
RDW: 15.6 % — AB (ref 12.3–15.4)
WBC: 14 10*3/uL — AB (ref 3.4–10.8)

## 2016-12-04 LAB — HIV ANTIBODY (ROUTINE TESTING W REFLEX): HIV Screen 4th Generation wRfx: NONREACTIVE

## 2016-12-04 LAB — RPR: RPR Ser Ql: NONREACTIVE

## 2016-12-11 ENCOUNTER — Encounter: Payer: BLUE CROSS/BLUE SHIELD | Admitting: Physical Therapy

## 2016-12-17 ENCOUNTER — Ambulatory Visit (INDEPENDENT_AMBULATORY_CARE_PROVIDER_SITE_OTHER): Payer: BLUE CROSS/BLUE SHIELD | Admitting: Family Medicine

## 2016-12-17 ENCOUNTER — Encounter (HOSPITAL_COMMUNITY): Payer: Self-pay

## 2016-12-17 VITALS — BP 110/61 | HR 83 | Wt 284.0 lb

## 2016-12-17 DIAGNOSIS — O0990 Supervision of high risk pregnancy, unspecified, unspecified trimester: Secondary | ICD-10-CM

## 2016-12-17 DIAGNOSIS — O09529 Supervision of elderly multigravida, unspecified trimester: Secondary | ICD-10-CM

## 2016-12-17 DIAGNOSIS — O24415 Gestational diabetes mellitus in pregnancy, controlled by oral hypoglycemic drugs: Secondary | ICD-10-CM

## 2016-12-17 DIAGNOSIS — Z98891 History of uterine scar from previous surgery: Secondary | ICD-10-CM

## 2016-12-17 DIAGNOSIS — Z029 Encounter for administrative examinations, unspecified: Secondary | ICD-10-CM

## 2016-12-17 NOTE — Progress Notes (Signed)
Subjective:  Teresa Perez is a 41 y.o. G6P5005 at 24w6dbeing seen today for ongoing prenatal care.  She is currently monitored for the following issues for this high-risk pregnancy and has Previous cesarean section; Cigarette smoker within last 12 months; Severe obesity (BMI >= 40) ([place]; Antepartum multigravida of advanced maternal age; Fasting hyperglycemia; Reactive airway disease that is not asthma; Anemia; Depression; PCOS (polycystic ovarian syndrome); Insomnia; Unspecified vitamin D deficiency; Leukocytosis; Abnormal uterine bleeding; Menorrhagia; HPV test positive; Acute bacterial bronchitis; History of conization of cervix affecting pregnancy, antepartum; Supervision of high risk pregnancy, antepartum; and Gestational diabetes mellitus (GDM), antepartum on her problem list.  GDM: Patient taking Glyburide 2.567mat night..  Reports a couple hypoglycemic episodes.  Tolerating medication well Fasting: controlled (had a couple elevated values when forgot to take glyburide at night) 2hr PP: a couple elevated values, but controlled.  Patient reports no complaints.  Contractions: Not present. Vag. Bleeding: None.  Movement: Present. Denies leaking of fluid.   The following portions of the patient's history were reviewed and updated as appropriate: allergies, current medications, past family history, past medical history, past social history, past surgical history and problem list. Problem list updated.  Objective:   Vitals:   12/17/16 0847  BP: 110/61  Pulse: 83  Weight: 284 lb (128.8 kg)    Fetal Status: Fetal Heart Rate (bpm): 142 Fundal Height: 33 cm Movement: Present     General:  Alert, oriented and cooperative. Patient is in no acute distress.  Skin: Skin is warm and dry. No rash noted.   Cardiovascular: Normal heart rate noted  Respiratory: Normal respiratory effort, no problems with respiration noted  Abdomen: Soft, gravid, appropriate for gestational age. Pain/Pressure: Present      Pelvic: Vag. Bleeding: None Vag D/C Character: Thin   Cervical exam deferred        Extremities: Normal range of motion.  Edema: None  Mental Status: Normal mood and affect. Normal behavior. Normal judgment and thought content.   Urinalysis:      Assessment and Plan:  Pregnancy: G6P5005 at 3029w6d. Supervision of high risk pregnancy, antepartum FHT normal, FH normal.  2. Gestational diabetes mellitus (GDM) controlled on oral hypoglycemic drug, antepartum Continue glyburide. Check CBG after evening snack (doesn't eat meal).  NST starting at 32 weeks US [place] week for growth  3. Previous cesarean section Will schedule at 39 weeks  4. Severe obesity (BMI >= 40) (HCC)  5. Antepartum multigravida of advanced maternal age Starting NST  Preterm labor symptoms and general obstetric precautions including but not limited to vaginal bleeding, contractions, leaking of fluid and fetal movement were reviewed in detail with the patient. Please refer to After Visit Summary for other counseling recommendations.  No Follow-up on file.   JacTruett MainlandO

## 2016-12-23 ENCOUNTER — Encounter (HOSPITAL_COMMUNITY): Payer: Self-pay

## 2016-12-23 ENCOUNTER — Other Ambulatory Visit (HOSPITAL_COMMUNITY): Payer: Self-pay | Admitting: *Deleted

## 2016-12-23 ENCOUNTER — Ambulatory Visit (HOSPITAL_COMMUNITY)
Admission: RE | Admit: 2016-12-23 | Discharge: 2016-12-23 | Disposition: A | Discharge status: Home / Self Care | Payer: BLUE CROSS/BLUE SHIELD | Source: Ambulatory Visit | Attending: Family Medicine | Admitting: Family Medicine

## 2016-12-23 DIAGNOSIS — O99333 Smoking (tobacco) complicating pregnancy, third trimester: Secondary | ICD-10-CM | POA: Insufficient documentation

## 2016-12-23 DIAGNOSIS — O99213 Obesity complicating pregnancy, third trimester: Secondary | ICD-10-CM | POA: Insufficient documentation

## 2016-12-23 DIAGNOSIS — O09523 Supervision of elderly multigravida, third trimester: Secondary | ICD-10-CM | POA: Insufficient documentation

## 2016-12-23 DIAGNOSIS — Z3A31 31 weeks gestation of pregnancy: Secondary | ICD-10-CM | POA: Insufficient documentation

## 2016-12-23 DIAGNOSIS — O3443 Maternal care for other abnormalities of cervix, third trimester: Secondary | ICD-10-CM | POA: Insufficient documentation

## 2016-12-23 DIAGNOSIS — O24415 Gestational diabetes mellitus in pregnancy, controlled by oral hypoglycemic drugs: Secondary | ICD-10-CM | POA: Diagnosis not present

## 2016-12-23 DIAGNOSIS — O34211 Maternal care for low transverse scar from previous cesarean delivery: Secondary | ICD-10-CM | POA: Insufficient documentation

## 2016-12-23 NOTE — Addendum Note (Signed)
Encounter addended by: Felecia Jan on: 12/23/2016  8:55 AM<BR>    Actions taken: Imaging Exam ended

## 2016-12-28 ENCOUNTER — Ambulatory Visit: Payer: BLUE CROSS/BLUE SHIELD

## 2016-12-28 DIAGNOSIS — O24415 Gestational diabetes mellitus in pregnancy, controlled by oral hypoglycemic drugs: Secondary | ICD-10-CM

## 2016-12-28 NOTE — Progress Notes (Signed)
Patient wondering if we can do something to not have so many appointments throughout the week since she now is doing weekly growth scans.  Will add BPPs to scans per Dr. Nehemiah Settle.

## 2016-12-30 ENCOUNTER — Ambulatory Visit (HOSPITAL_COMMUNITY)
Admission: RE | Admit: 2016-12-30 | Discharge: 2016-12-30 | Disposition: A | Discharge status: Home / Self Care | Payer: BLUE CROSS/BLUE SHIELD | Source: Ambulatory Visit | Attending: Family Medicine | Admitting: Family Medicine

## 2016-12-30 ENCOUNTER — Encounter (HOSPITAL_COMMUNITY): Payer: Self-pay

## 2016-12-30 DIAGNOSIS — O09523 Supervision of elderly multigravida, third trimester: Secondary | ICD-10-CM | POA: Diagnosis not present

## 2016-12-30 DIAGNOSIS — Z3A32 32 weeks gestation of pregnancy: Secondary | ICD-10-CM | POA: Insufficient documentation

## 2016-12-30 DIAGNOSIS — O34219 Maternal care for unspecified type scar from previous cesarean delivery: Secondary | ICD-10-CM | POA: Diagnosis not present

## 2016-12-30 DIAGNOSIS — O99213 Obesity complicating pregnancy, third trimester: Secondary | ICD-10-CM | POA: Diagnosis not present

## 2016-12-30 DIAGNOSIS — E669 Obesity, unspecified: Secondary | ICD-10-CM | POA: Insufficient documentation

## 2016-12-30 DIAGNOSIS — O24415 Gestational diabetes mellitus in pregnancy, controlled by oral hypoglycemic drugs: Secondary | ICD-10-CM

## 2016-12-30 DIAGNOSIS — Z6841 Body Mass Index (BMI) 40.0 and over, adult: Secondary | ICD-10-CM | POA: Diagnosis not present

## 2016-12-31 ENCOUNTER — Encounter: Payer: BLUE CROSS/BLUE SHIELD | Admitting: Obstetrics & Gynecology

## 2017-01-04 ENCOUNTER — Other Ambulatory Visit: Payer: BLUE CROSS/BLUE SHIELD

## 2017-01-04 ENCOUNTER — Ambulatory Visit (INDEPENDENT_AMBULATORY_CARE_PROVIDER_SITE_OTHER): Payer: BLUE CROSS/BLUE SHIELD | Admitting: Obstetrics & Gynecology

## 2017-01-04 VITALS — BP 127/73 | HR 80 | Wt 287.0 lb

## 2017-01-04 DIAGNOSIS — Z87891 Personal history of nicotine dependence: Secondary | ICD-10-CM

## 2017-01-04 DIAGNOSIS — J989 Respiratory disorder, unspecified: Secondary | ICD-10-CM

## 2017-01-04 DIAGNOSIS — O09529 Supervision of elderly multigravida, unspecified trimester: Secondary | ICD-10-CM

## 2017-01-04 DIAGNOSIS — Z98891 History of uterine scar from previous surgery: Secondary | ICD-10-CM

## 2017-01-04 DIAGNOSIS — O09523 Supervision of elderly multigravida, third trimester: Secondary | ICD-10-CM

## 2017-01-04 DIAGNOSIS — O0990 Supervision of high risk pregnancy, unspecified, unspecified trimester: Secondary | ICD-10-CM

## 2017-01-04 DIAGNOSIS — O0993 Supervision of high risk pregnancy, unspecified, third trimester: Secondary | ICD-10-CM

## 2017-01-04 DIAGNOSIS — O3440 Maternal care for other abnormalities of cervix, unspecified trimester: Secondary | ICD-10-CM

## 2017-01-04 DIAGNOSIS — R0989 Other specified symptoms and signs involving the circulatory and respiratory systems: Secondary | ICD-10-CM

## 2017-01-04 DIAGNOSIS — O24414 Gestational diabetes mellitus in pregnancy, insulin controlled: Secondary | ICD-10-CM

## 2017-01-04 DIAGNOSIS — Z9889 Other specified postprocedural states: Secondary | ICD-10-CM

## 2017-01-04 DIAGNOSIS — O3443 Maternal care for other abnormalities of cervix, third trimester: Secondary | ICD-10-CM

## 2017-01-04 NOTE — Progress Notes (Signed)
   PRENATAL VISIT NOTE  Subjective:  Teresa Perez is a 41 y.o. G6P5005 at 56w3dbeing seen today for ongoing prenatal care.  She is currently monitored for the following issues for this high-risk pregnancy and has Previous cesarean section; Cigarette smoker within last 12 months; Severe obesity (BMI >= 40) ([place]; Antepartum multigravida of advanced maternal age; Fasting hyperglycemia; Reactive airway disease that is not asthma; Anemia; Depression; PCOS (polycystic ovarian syndrome); Insomnia; Unspecified vitamin D deficiency; Leukocytosis; Abnormal uterine bleeding; Menorrhagia; HPV test positive; Acute bacterial bronchitis; History of conization of cervix affecting pregnancy, antepartum; Supervision of high risk pregnancy, antepartum; and Gestational diabetes mellitus (GDM), antepartum on her problem list.  Patient reports no complaints.  Contractions: Irregular. Vag. Bleeding: None.  Movement: Present. Denies leaking of fluid.   The following portions of the patient's history were reviewed and updated as appropriate: allergies, current medications, past family history, past medical history, past social history, past surgical history and problem list. Problem list updated.  Objective:   Vitals:   01/04/17 0854  BP: 127/73  Pulse: 80  Weight: 287 lb (130.2 kg)    Fetal Status: Fetal Heart Rate (bpm): 152   Movement: Present     General:  Alert, oriented and cooperative. Patient is in no acute distress.  Skin: Skin is warm and dry. No rash noted.   Cardiovascular: Normal heart rate noted  Respiratory: Normal respiratory effort, no problems with respiration noted  Abdomen: Soft, gravid, appropriate for gestational age. Pain/Pressure: Present     Pelvic:  Cervical exam deferred        Extremities: Normal range of motion.  Edema: Trace  Mental Status: Normal mood and affect. Normal behavior. Normal judgment and thought content.   Assessment and Plan:  Pregnancy: GQ3E0923at 375w3d1.  Supervision of high risk pregnancy, antepartum NST reviewed and reactive.  2. History of conization of cervix affecting pregnancy, antepartum  3. Insulin controlled gestational diabetes mellitus (GDM) during pregnancy, antepartum Pt did not have log with her but, reports that all of her fastings glucsoe is <85 and her 1 hour PP is <125  4. Reactive airway disease that is not asthma  5. Antepartum multigravida of advanced maternal age  77.23Severe obesity (BMI >= 40) (HCC) Monthly [place] growth  7. Cigarette smoker within last 12 months  8. Previous cesarean section Scheduled for repeat c-section  Preterm labor symptoms and general obstetric precautions including but not limited to vaginal bleeding, contractions, leaking of fluid and fetal movement were reviewed in detail with the patient. Please refer to After Visit Summary for other counseling recommendations.  f/u in 2 weeks for MD visit Biweekyl NST with weekyl AFI here Monthyl [place] growth at MFM (all other appt at MFM cancelled as pt does not wish to go for weekly BPPs due to distance from work)  Total face-to-face time with patient was 30 min.  Greater than 50% was spent in counseling and coordination of care with the patient.     HaLavonia DraftsMD

## 2017-01-06 ENCOUNTER — Inpatient Hospital Stay (HOSPITAL_COMMUNITY): Admission: RE | Admit: 2017-01-06 | Payer: BLUE CROSS/BLUE SHIELD | Source: Ambulatory Visit

## 2017-01-07 ENCOUNTER — Other Ambulatory Visit: Payer: BLUE CROSS/BLUE SHIELD

## 2017-01-07 ENCOUNTER — Ambulatory Visit (INDEPENDENT_AMBULATORY_CARE_PROVIDER_SITE_OTHER): Payer: BLUE CROSS/BLUE SHIELD

## 2017-01-07 DIAGNOSIS — O24414 Gestational diabetes mellitus in pregnancy, insulin controlled: Secondary | ICD-10-CM | POA: Diagnosis not present

## 2017-01-11 ENCOUNTER — Encounter (HOSPITAL_COMMUNITY): Payer: Self-pay

## 2017-01-11 ENCOUNTER — Inpatient Hospital Stay (HOSPITAL_COMMUNITY)
Admission: AD | Admit: 2017-01-11 | Discharge: 2017-01-11 | Disposition: A | Discharge status: Home / Self Care | Payer: BLUE CROSS/BLUE SHIELD | Source: Ambulatory Visit | Attending: Obstetrics and Gynecology | Admitting: Obstetrics and Gynecology

## 2017-01-11 ENCOUNTER — Encounter: Payer: BLUE CROSS/BLUE SHIELD | Admitting: Obstetrics & Gynecology

## 2017-01-11 DIAGNOSIS — R0602 Shortness of breath: Secondary | ICD-10-CM

## 2017-01-11 DIAGNOSIS — Z3A34 34 weeks gestation of pregnancy: Secondary | ICD-10-CM | POA: Insufficient documentation

## 2017-01-11 DIAGNOSIS — O99333 Smoking (tobacco) complicating pregnancy, third trimester: Secondary | ICD-10-CM | POA: Insufficient documentation

## 2017-01-11 DIAGNOSIS — F17210 Nicotine dependence, cigarettes, uncomplicated: Secondary | ICD-10-CM | POA: Diagnosis not present

## 2017-01-11 DIAGNOSIS — O26893 Other specified pregnancy related conditions, third trimester: Secondary | ICD-10-CM | POA: Insufficient documentation

## 2017-01-11 MED ORDER — ALBUTEROL SULFATE HFA 108 (90 BASE) MCG/ACT IN AERS: 2.0000 | INHALATION_SPRAY | Freq: Four times a day (QID) | RESPIRATORY_TRACT | 2 refills | Status: DC | PRN

## 2017-01-11 MED ORDER — IPRATROPIUM-ALBUTEROL 0.5-2.5 (3) MG/3ML IN SOLN
3.0000 mL | RESPIRATORY_TRACT | Status: AC
  Administered 2017-01-11: 3 mL via RESPIRATORY_TRACT
  Filled 2017-01-11: qty 3

## 2017-01-11 MED ORDER — PREDNISONE 20 MG PO TABS: 40.0000 mg | ORAL_TABLET | Freq: Every day | ORAL | 0 refills | Status: DC

## 2017-01-11 MED ORDER — BENZONATATE 100 MG PO CAPS: 100.0000 mg | ORAL_CAPSULE | Freq: Three times a day (TID) | ORAL | 0 refills | Status: DC | PRN

## 2017-01-11 MED FILL — predniSONE 20 MG TABS: 20 | 5 days supply | Qty: 10 | Fill #0

## 2017-01-11 MED FILL — PROAIR HFA 90 MCG INHALER: 108 (90 BAS | 25 days supply | Qty: 9 | Fill #0

## 2017-01-11 MED FILL — BENZONATATE 100 MG CAP: 100 | 5 days supply | Qty: 15 | Fill #0

## 2017-01-11 NOTE — MAU Provider Note (Signed)
History     CSN: [id number]  Arrival date and time: 01/11/17 0946   First Provider Initiated Contact with Patient 01/11/17 1027      Chief Complaint  Patient presents with  . Shortness of Breath   [name] is a 41 yo G6P5005 at 34.[redacted] wks gestation by LMP presenting for SOB x 2 days.  She reports she is unable to catch her breath completely.  She has tried taking Claritin, Flonase, using steam and drinking hot tea with minimal relief.  She reports taking her friend's Albuterol nebulizer treatment yesterday with minimal relief.  She has been coughing up white phlegm. Denies vomiting. She has a childhood h/o asthma, but initially thought her symptoms were d/t allergies. She was in the clinic this morning for a NST. She reports (+) FM.  Shortness of Breath  This is a recurrent problem. The current episode started yesterday. The problem occurs constantly. The problem has been unchanged. Associated symptoms include wheezing. Pertinent negatives include no sore throat. The symptoms are aggravated by any activity. Risk factors: pregnancy. She has tried rest and beta agonist inhalers for the symptoms. The treatment provided mild relief. Her past medical history is significant for allergies and asthma (childhood).     Past Medical History:  Diagnosis Date  . Anemia   . Anxiety   . Asthma    childhood asthma  . Boil    tendency to form boils  . Depression    has been treated in past-no present meds  . Diabetes mellitus without complication (HCC)    PRE DIABETES, NO MEDS FOR LAST SIX MONTHS  . Family history of anesthesia complication 5170   son- age 4 - "had lockjaw" after surgery (T&A)  . Gestational diabetes   . Headache(784.0)   . Hx of migraine headaches   . Hypertension    pt states she was treated with hctz for pre-htn for approx 75mo-has not taken since 07/2010  . PCOS (polycystic ovarian syndrome)   . Pneumonia    AS A CHILD    Past Surgical History:  Procedure  Laterality Date  . CERVICAL CONIZATION W/BX N/A 10/31/2015   Procedure: CONIZATION CERVIX WITH BIOPSY;  Surgeon: VEldred Manges MD;  [facility];  Service: Gynecology;  Laterality: N/A;  . CESAREAN SECTION     x2 previous  . CESAREAN SECTION  07/03/2011   Procedure: CESAREAN SECTION;  Surgeon: SAlwyn Pea MD;  [facility];  Service: Gynecology;  Laterality: N/A;  . CESAREAN SECTION N/A 06/22/2013   Procedure: CESAREAN SECTION, repeat;  Surgeon: VEldred Manges MD;  [facility];  Service: Obstetrics;  Laterality: N/A;  . IUD REMOVAL N/A 10/31/2015   Procedure: INTRAUTERINE DEVICE (IUD) REMOVAL;  Surgeon: VEldred Manges MD;  [facility];  Service: Gynecology;  Laterality: N/A;  . MOUTH SURGERY  2004    Family History  Problem Relation Age of Onset  . Anesthesia problems Son        son-had T&A-2008-had trismus  . Diabetes Mother   . Hypertension Mother   . Hyperlipidemia Mother   . Arthritis Mother   . Hypertension Father   . Hypertension Paternal Grandmother   . Hyperlipidemia Paternal Grandmother   . Depression Neg Hx     Social History  Substance Use Topics  . Smoking status: Current Some Day Smoker    Packs/day: 0.25    Years: 16.00    Types: Cigarettes  . Smokeless tobacco: Never Used  .  Alcohol use No     Comment: occasional     Allergies:  Allergies  Allergen Reactions  . Latex Rash    Prescriptions Prior to Admission  Medication Sig Dispense Refill Last Dose  . ACCU-CHEK FASTCLIX LANCETS MISC 1 Units by Percutaneous route 4 (four) times daily. 100 each 12 Taking  . Blood Glucose Monitoring Suppl (ACCU-CHEK NANO SMARTVIEW) w/Device KIT 1 kit by Subdermal route as directed. Check blood sugars for fasting, and two hours after breakfast, lunch and dinner (4 checks daily) 1 kit 0 Taking  . citalopram (CELEXA) 20 MG tablet Take 1 tablet (20 mg total) by mouth daily. (Patient not taking: Reported on 12/23/2016) 30 tablet 12 Not Taking  .  glucose blood (ACCU-CHEK SMARTVIEW) test strip Use as instructed to check blood sugars 100 each 12 Taking  . glyBURIDE (DIABETA) 5 MG tablet Take 1.5 tablets (7.5 mg total) by mouth daily after supper. 90 tablet 2 Taking  . HALOG 0.1 % OINT Apply 1 application topically 2 (two) times daily.  1 Taking  . Iron-FA-B Cmp-C-Biot-Probiotic (FUSION PLUS) CAPS Take 1 capsule by mouth daily.  12 Taking  . Prenatal Vit-Fe Fumarate-FA (PREPLUS) 27-1 MG TABS Take 1 tablet by mouth daily. 30 tablet 13 Taking    Review of Systems  Constitutional: Negative.   HENT: Negative for sinus pressure and sore throat.   Eyes: Negative.   Respiratory: Positive for cough (coughing up white phlegm), shortness of breath and wheezing. Negative for chest tightness.   Cardiovascular: Negative.   Gastrointestinal: Negative.   Endocrine: Negative.   Genitourinary: Negative.   Musculoskeletal: Negative.   Skin: Negative.   Allergic/Immunologic: Positive for environmental allergies.  Neurological: Negative.   Hematological: Negative.   Psychiatric/Behavioral: Negative.    Physical Exam   Blood pressure (!) 143/84, pulse 86, resp. rate 20, weight 129.3 kg (285 lb 0.6 oz), last menstrual period 05/15/2016, SpO2 99 %.  Physical Exam  Constitutional: She is oriented to person, place, and time. She appears well-developed and well-nourished.  HENT:  Head: Normocephalic.  Eyes: Pupils are equal, round, and reactive to light.  Neck: Normal range of motion.  Cardiovascular: Normal rate, regular rhythm, normal heart sounds and intact distal pulses.   Respiratory: Effort normal. She has wheezes. She exhibits no tenderness.  wheezing in all lobes bilaterally  GI: Soft. Bowel sounds are normal.  Genitourinary:  Genitourinary Comments: Gravid, S>D, pelvic deferred  Musculoskeletal: Normal range of motion.  Neurological: She is alert and oriented to person, place, and time. She has normal reflexes.  Skin: Skin is warm and  dry.  Psychiatric: She has a normal mood and affect. Her behavior is normal. Judgment and thought content normal.   NST  FHR: 135 bpm / moderate variability / accels present / decels absent TOCO: none  MAU Course  Procedures  MDM Continuous Pulse Ox NST DuoNeb Treatment - SOB improved afterwards Rx Predisone 40 mg daily x 5 days Rx ProAir inhaler 2 puffs every 6 hrs prn SOB/wheezing Rx Tessalon Perles 1 capsule TID prn cough  Assessment and Plan  40 yo G6P5005 34.[redacted] wks gestation Shortness of breath due to pregnancy in third trimester Rx Predisone 40 mg daily x 5 days Rx ProAir inhaler 2 puffs every 6 hrs prn SOB/wheezing Rx Tessalon Perles 1 capsule TID prn cough Return to MAU for emergencies. Keep schedules appts with [facility]FM and [facility]  Rolitta Dawson MSN, CNM 01/11/2017, 10:27 AM  

## 2017-01-11 NOTE — MAU Note (Signed)
Urine in lab

## 2017-01-11 NOTE — MAU Note (Signed)
Patient states she was down getting her NST today in the clinic and she felt short of breath. States she cant seem to get a deep breath.

## 2017-01-13 ENCOUNTER — Inpatient Hospital Stay (HOSPITAL_COMMUNITY): Admission: RE | Admit: 2017-01-13 | Payer: BLUE CROSS/BLUE SHIELD | Source: Ambulatory Visit

## 2017-01-14 ENCOUNTER — Ambulatory Visit (INDEPENDENT_AMBULATORY_CARE_PROVIDER_SITE_OTHER): Payer: BLUE CROSS/BLUE SHIELD | Admitting: Family Medicine

## 2017-01-14 VITALS — BP 129/80 | HR 92

## 2017-01-14 DIAGNOSIS — O34219 Maternal care for unspecified type scar from previous cesarean delivery: Secondary | ICD-10-CM

## 2017-01-14 DIAGNOSIS — J208 Acute bronchitis due to other specified organisms: Secondary | ICD-10-CM

## 2017-01-14 DIAGNOSIS — O0990 Supervision of high risk pregnancy, unspecified, unspecified trimester: Secondary | ICD-10-CM

## 2017-01-14 DIAGNOSIS — B9689 Other specified bacterial agents as the cause of diseases classified elsewhere: Secondary | ICD-10-CM

## 2017-01-14 DIAGNOSIS — O24414 Gestational diabetes mellitus in pregnancy, insulin controlled: Secondary | ICD-10-CM | POA: Diagnosis not present

## 2017-01-14 DIAGNOSIS — Z98891 History of uterine scar from previous surgery: Secondary | ICD-10-CM

## 2017-01-14 DIAGNOSIS — J989 Respiratory disorder, unspecified: Secondary | ICD-10-CM

## 2017-01-14 DIAGNOSIS — O0993 Supervision of high risk pregnancy, unspecified, third trimester: Secondary | ICD-10-CM

## 2017-01-14 DIAGNOSIS — R0989 Other specified symptoms and signs involving the circulatory and respiratory systems: Secondary | ICD-10-CM

## 2017-01-14 MED ORDER — PREDNISONE 20 MG PO TABS: 40.0000 mg | ORAL_TABLET | Freq: Every day | ORAL | 0 refills | Status: AC

## 2017-01-14 MED ORDER — AZITHROMYCIN 250 MG PO TABS: ORAL_TABLET | ORAL | 0 refills | Status: DC

## 2017-01-14 MED FILL — AZITHROMYCIN 250 MG TABLET: 250 | 5 days supply | Qty: 6 | Fill #0

## 2017-01-14 NOTE — Patient Instructions (Signed)
Safe Medications in Pregnancy   Acne: Benzoyl Peroxide Salicylic Acid  Backache/Headache: Tylenol: 2 regular strength every 4 hours OR              2 Extra strength every 6 hours  Colds/Coughs/Allergies: Benadryl (alcohol free) 25 mg every 6 hours as needed Breath right strips Claritin Cepacol throat lozenges Chloraseptic throat spray Cold-Eeze- up to three times per day Cough drops, alcohol free Flonase (by prescription only) Guaifenesin Mucinex Robitussin DM (plain only, alcohol free) Saline nasal spray/drops Sudafed (pseudoephedrine) & Actifed ** use only after [redacted] weeks gestation and if you do not have high blood pressure Tylenol Vicks Vaporub Zinc lozenges Zyrtec   Constipation: Colace Ducolax suppositories Fleet enema Glycerin suppositories Metamucil Milk of magnesia Miralax Senokot Smooth move tea  Diarrhea: Kaopectate Imodium A-D  *NO pepto Bismol  Hemorrhoids: Anusol Anusol HC Preparation H Tucks  Indigestion: Tums Maalox Mylanta Zantac  Pepcid  Insomnia: Benadryl (alcohol free) 47m every 6 hours as needed Tylenol PM Unisom, no Gelcaps  Leg Cramps: Tums MagGel  Nausea/Vomiting:  Bonine Dramamine Emetrol Ginger extract Sea bands Meclizine  Nausea medication to take during pregnancy:  Unisom (doxylamine succinate 25 mg tablets) Take one tablet daily at bedtime. If symptoms are not adequately controlled, the dose can be increased to a maximum recommended dose of two tablets daily (1/2 tablet in the morning, 1/2 tablet mid-afternoon and one at bedtime). Vitamin B6 108mtablets. Take one tablet twice a day (up to 200 mg per day).  Skin Rashes: Aveeno products Benadryl cream or 2543mvery 6 hours as needed Calamine Lotion 1% cortisone cream  Yeast infection: Gyne-lotrimin 7 Monistat 7   **If taking multiple medications, please check labels to avoid duplicating the same active ingredients **take medication as directed on  the label ** Do not exceed 4000 mg of tylenol in 24 hours **Do not take medications that contain aspirin or ibuprofen   Places to get circumcisions: [facility]: phone number: [phone], needs to be done within 2 weeks of birth) [facility]: [phone], needs to be done within 7 days of birth) [facility]: Phone number: [phone] to be done within 4 weeks of birth)

## 2017-01-14 NOTE — Progress Notes (Signed)
   PRENATAL VISIT NOTE  Subjective:  Teresa Perez is a 41 y.o. G6P5005 at 40w6dbeing seen today for ongoing prenatal care.  She is currently monitored for the following issues for this high-risk pregnancy and has Previous cesarean section; Cigarette smoker within last 12 months; Severe [place] (BMI >= 40) [place]; Antepartum multigravida of advanced maternal age; Fasting hyperglycemia; Reactive airway disease that is not asthma; Anemia; Depression; PCOS (polycystic ovarian syndrome); Insomnia; Unspecified vitamin D deficiency; Leukocytosis; Abnormal uterine bleeding; Menorrhagia; HPV test positive; Acute bacterial bronchitis; History of conization of cervix affecting pregnancy, antepartum; Supervision of high risk pregnancy, antepartum; Gestational diabetes mellitus (GDM), antepartum; and Shortness of breath due to pregnancy in third trimester on her problem list.  Patient reports Wheezing and coughing. Feels tight. Was seen in [facility]U on monday and prescribed Predisone and albuterol. .  Contractions: Irritability. Vag. Bleeding: None.  Movement: Present. Denies leaking of fluid.   The following portions of the patient's history were reviewed and updated as appropriate: allergies, current medications, past family history, past medical history, past social history, past surgical history and problem list. Problem list updated.  Objective:   Vitals:   01/14/17 1023  BP: 129/80  Pulse: 92    Fetal Status: Fetal Heart Rate (bpm): NST   Movement: Present     General:  Alert, oriented and cooperative. Patient is in no acute distress.  Skin: Skin is warm and dry. No rash noted.   Cardiovascular: Normal heart rate noted  Respiratory: Normal respiratory effort, no problems with respiration noted  Abdomen: Soft, gravid, appropriate for gestational age. Pain/Pressure: Present     Pelvic:  Cervical exam deferred        Extremities: Normal range of motion.  Edema: Trace  Mental Status: Normal mood and affect.  Normal behavior. Normal judgment and thought content.   Assessment and Plan:  Pregnancy: [id number] 350w6d1. Supervision of high risk pregnancy, antepartum FHT normal  2. Previous cesarean section Scheduled for repeat  3. Reactive airway disease that is not asthma Refill prednisone. Add azithromycin. Recommended mucinex or robittusin.  4. Acute bacterial bronchitis 5. GDM Pt did not bring book as she was not planning on seeing doctor.  Preterm labor symptoms and general obstetric precautions including but not limited to vaginal bleeding, contractions, leaking of fluid and fetal movement were reviewed in detail with the patient. Please refer to After Visit Summary for other counseling recommendations.  No Follow-up on file.   JaTruett MainlandDO

## 2017-01-14 NOTE — Addendum Note (Signed)
Addended by: Phill Myron on: 01/14/2017 11:13 AM   Modules accepted: Orders

## 2017-01-20 ENCOUNTER — Ambulatory Visit (HOSPITAL_COMMUNITY)
Admission: RE | Admit: 2017-01-20 | Discharge: 2017-01-20 | Disposition: A | Discharge status: Home / Self Care | Payer: BLUE CROSS/BLUE SHIELD | Source: Ambulatory Visit | Attending: Family Medicine | Admitting: Family Medicine

## 2017-01-20 ENCOUNTER — Encounter (HOSPITAL_COMMUNITY): Payer: Self-pay

## 2017-01-20 ENCOUNTER — Other Ambulatory Visit (HOSPITAL_COMMUNITY): Payer: Self-pay | Admitting: *Deleted

## 2017-01-20 DIAGNOSIS — Z3A35 35 weeks gestation of pregnancy: Secondary | ICD-10-CM | POA: Insufficient documentation

## 2017-01-20 DIAGNOSIS — O24415 Gestational diabetes mellitus in pregnancy, controlled by oral hypoglycemic drugs: Secondary | ICD-10-CM | POA: Insufficient documentation

## 2017-01-20 DIAGNOSIS — O09523 Supervision of elderly multigravida, third trimester: Secondary | ICD-10-CM | POA: Diagnosis not present

## 2017-01-20 DIAGNOSIS — O99213 Obesity complicating pregnancy, third trimester: Secondary | ICD-10-CM | POA: Diagnosis not present

## 2017-01-21 ENCOUNTER — Ambulatory Visit (HOSPITAL_COMMUNITY): Payer: BLUE CROSS/BLUE SHIELD

## 2017-01-27 ENCOUNTER — Ambulatory Visit (HOSPITAL_COMMUNITY): Payer: BLUE CROSS/BLUE SHIELD

## 2017-01-28 ENCOUNTER — Ambulatory Visit (INDEPENDENT_AMBULATORY_CARE_PROVIDER_SITE_OTHER): Payer: BLUE CROSS/BLUE SHIELD | Admitting: Family Medicine

## 2017-01-28 ENCOUNTER — Other Ambulatory Visit (HOSPITAL_COMMUNITY)
Admission: RE | Admit: 2017-01-28 | Discharge: 2017-01-28 | Disposition: A | Discharge status: Home / Self Care | Payer: BLUE CROSS/BLUE SHIELD | Source: Ambulatory Visit | Attending: Family Medicine | Admitting: Family Medicine

## 2017-01-28 VITALS — BP 135/87 | HR 90 | Wt 285.0 lb

## 2017-01-28 DIAGNOSIS — O24415 Gestational diabetes mellitus in pregnancy, controlled by oral hypoglycemic drugs: Secondary | ICD-10-CM | POA: Diagnosis not present

## 2017-01-28 DIAGNOSIS — O09529 Supervision of elderly multigravida, unspecified trimester: Secondary | ICD-10-CM

## 2017-01-28 DIAGNOSIS — O0993 Supervision of high risk pregnancy, unspecified, third trimester: Secondary | ICD-10-CM

## 2017-01-28 DIAGNOSIS — O09523 Supervision of elderly multigravida, third trimester: Secondary | ICD-10-CM

## 2017-01-28 DIAGNOSIS — Z113 Encounter for screening for infections with a predominantly sexual mode of transmission: Secondary | ICD-10-CM | POA: Diagnosis not present

## 2017-01-28 DIAGNOSIS — Z98891 History of uterine scar from previous surgery: Secondary | ICD-10-CM

## 2017-01-28 DIAGNOSIS — O0990 Supervision of high risk pregnancy, unspecified, unspecified trimester: Secondary | ICD-10-CM

## 2017-01-28 DIAGNOSIS — Z3493 Encounter for supervision of normal pregnancy, unspecified, third trimester: Secondary | ICD-10-CM | POA: Diagnosis not present

## 2017-01-28 DIAGNOSIS — O34219 Maternal care for unspecified type scar from previous cesarean delivery: Secondary | ICD-10-CM

## 2017-01-28 NOTE — Progress Notes (Signed)
Subjective:  Teresa Perez is a 41 y.o. G6P5005 at 6w6dbeing seen today for ongoing prenatal care.  She is currently monitored for the following issues for this high-risk pregnancy and has Previous cesarean section; Cigarette smoker within last 12 months; Severe [place] (BMI >= 40) ([place]; Antepartum multigravida of advanced maternal age; Reactive airway disease that is not asthma; Anemia; Depression; PCOS (polycystic ovarian syndrome); Insomnia; Unspecified vitamin D deficiency; Leukocytosis; Abnormal uterine bleeding; Menorrhagia; Acute bacterial bronchitis; History of conization of cervix affecting pregnancy, antepartum; Supervision of high risk pregnancy, antepartum; Gestational diabetes mellitus (GDM), antepartum; and Shortness of breath due to pregnancy in third trimester on her problem list.  GDM: Patient is supposed to be taking glyburide 7.522mafter dinner, but has not taken it for the past 5 days. She also did not bring in her log book or check her blood sugars. She states that she was concerned that the baby wasn't gaining enough weight because the last [place] baby seemed small to her.  Patient reports no complaints.  Contractions: Irritability. Vag. Bleeding: None.  Movement: Present. Denies leaking of fluid.   The following portions of the patient's history were reviewed and updated as appropriate: allergies, current medications, past family history, past medical history, past social history, past surgical history and problem list. Problem list updated.  Objective:   Vitals:   01/28/17 0905  BP: 135/87  Pulse: 90  Weight: 285 lb (129.3 kg)    Fetal Status:     Movement: Present     General:  Alert, oriented and cooperative. Patient is in no acute distress.  Skin: Skin is warm and dry. No rash noted.   Cardiovascular: Normal heart rate noted  Respiratory: Normal respiratory effort, no problems with respiration noted  Abdomen: Soft, gravid, appropriate for gestational age.  Pain/Pressure: Present     Pelvic: Vag. Bleeding: None     Cervical exam deferred        Extremities: Normal range of motion.  Edema: Trace  Mental Status: Normal mood and affect. Normal behavior. Normal judgment and thought content.   Urinalysis: Urine Protein: Negative Urine Glucose: Negative  Assessment and Plan:  Pregnancy: G6P5005 at 3652w6d. Prenatal care in third trimester FHT and FH normal - Culture, beta strep (group b only) - [facility]lam[facility]. Supervision of high risk pregnancy, antepartum  3. Gestational diabetes mellitus (GDM) controlled on oral hypoglycemic drug, antepartum Emphasized taking CBGs and taking glyburide. Pt will restart. NST EFW 23% (5#2oz) - discussed that this is a normal weight  4. Antepartum multigravida of advanced maternal age  83. 68revious cesarean section Scheduled for repeat with BTL  Preterm labor symptoms and general obstetric precautions including but not limited to vaginal bleeding, contractions, leaking of fluid and fetal movement were reviewed in detail with the patient. Please refer to After Visit Summary for other counseling recommendations.  No Follow-up on file.   StiTruett MainlandO

## 2017-01-29 LAB — GC/CHLAMYDIA PROBE AMP (~~LOC~~) NOT AT ARMC
CHLAMYDIA, DNA PROBE: NEGATIVE
Neisseria Gonorrhea: NEGATIVE

## 2017-02-01 ENCOUNTER — Ambulatory Visit (INDEPENDENT_AMBULATORY_CARE_PROVIDER_SITE_OTHER): Payer: BLUE CROSS/BLUE SHIELD

## 2017-02-01 VITALS — BP 131/83 | HR 80

## 2017-02-01 DIAGNOSIS — O24415 Gestational diabetes mellitus in pregnancy, controlled by oral hypoglycemic drugs: Secondary | ICD-10-CM

## 2017-02-01 LAB — CULTURE, BETA STREP (GROUP B ONLY): Strep Gp B Culture: NEGATIVE

## 2017-02-03 ENCOUNTER — Telehealth (HOSPITAL_COMMUNITY): Payer: Self-pay | Admitting: *Deleted

## 2017-02-03 ENCOUNTER — Ambulatory Visit (HOSPITAL_COMMUNITY): Payer: BLUE CROSS/BLUE SHIELD

## 2017-02-03 NOTE — Telephone Encounter (Signed)
Preadmission screen  

## 2017-02-04 ENCOUNTER — Encounter (HOSPITAL_COMMUNITY): Payer: Self-pay

## 2017-02-04 ENCOUNTER — Ambulatory Visit (INDEPENDENT_AMBULATORY_CARE_PROVIDER_SITE_OTHER): Payer: BLUE CROSS/BLUE SHIELD | Admitting: Family Medicine

## 2017-02-04 VITALS — BP 136/80 | HR 86

## 2017-02-04 DIAGNOSIS — O24415 Gestational diabetes mellitus in pregnancy, controlled by oral hypoglycemic drugs: Secondary | ICD-10-CM

## 2017-02-04 DIAGNOSIS — O0990 Supervision of high risk pregnancy, unspecified, unspecified trimester: Secondary | ICD-10-CM

## 2017-02-04 DIAGNOSIS — O0993 Supervision of high risk pregnancy, unspecified, third trimester: Secondary | ICD-10-CM

## 2017-02-04 DIAGNOSIS — O34219 Maternal care for unspecified type scar from previous cesarean delivery: Secondary | ICD-10-CM

## 2017-02-04 DIAGNOSIS — Z98891 History of uterine scar from previous surgery: Secondary | ICD-10-CM

## 2017-02-04 NOTE — Progress Notes (Signed)
Subjective:  Teresa Perez is a 41 y.o. G6P5005 at 31w6dbeing seen today for ongoing prenatal care.  She is currently monitored for the following issues for this high-risk pregnancy and has Previous cesarean section; Cigarette smoker within last 12 months; Severe obesity (BMI >= 40) ([place]; Antepartum multigravida of advanced maternal age; Reactive airway disease that is not asthma; Anemia; Depression; PCOS (polycystic ovarian syndrome); Insomnia; Unspecified vitamin D deficiency; Leukocytosis; Abnormal uterine bleeding; Menorrhagia; Acute bacterial bronchitis; History of conization of cervix affecting pregnancy, antepartum; Supervision of high risk pregnancy, antepartum; Gestational diabetes mellitus (GDM), antepartum; and Shortness of breath due to pregnancy in third trimester on her problem list.  GDM: Patient taking glyburide.  Reports 1 hypoglycemic episodes.  Tolerating medication well Fasting: usually 69-90 (1 elevated - forgot to take medication) 2hr PP: 1-2 elevated - not checking after dinner  Patient reports no complaints.  Contractions: Irregular. Vag. Bleeding: None.  Movement: Present. Denies leaking of fluid.   The following portions of the patient's history were reviewed and updated as appropriate: allergies, current medications, past family history, past medical history, past social history, past surgical history and problem list. Problem list updated.  Objective:   Vitals:   02/04/17 0935  BP: 136/80  Pulse: 86    Fetal Status: Fetal Heart Rate (bpm): NST   Movement: Present     General:  Alert, oriented and cooperative. Patient is in no acute distress.  Skin: Skin is warm and dry. No rash noted.   Cardiovascular: Normal heart rate noted  Respiratory: Normal respiratory effort, no problems with respiration noted  Abdomen: Soft, gravid, appropriate for gestational age. Pain/Pressure: Present     Pelvic: Vag. Bleeding: None     Cervical exam deferred        Extremities:  Normal range of motion.  Edema: Trace  Mental Status: Normal mood and affect. Normal behavior. Normal judgment and thought content.   Urinalysis: Urine Protein: Negative Urine Glucose: Negative  Assessment and Plan:  Pregnancy: G6P5005 at 321w6d1. Supervision of high risk pregnancy, antepartum FHT normal  2. Gestational diabetes mellitus (GDM) controlled on oral hypoglycemic drug, antepartum NST reactive Encouraged pt to check at night and not to miss doses.  3. Previous cesarean section Repeat scheduled  4. Severe obesity (BMI >= 40) ([facility])  Term labor symptoms and general obstetric precautions including but not limited to vaginal bleeding, contractions, leaking of fluid and fetal movement were reviewed in detail with the patient. Please refer to After Visit Summary for other counseling recommendations.  No Follow-up on file.   StTruett MainlandDO

## 2017-02-08 ENCOUNTER — Ambulatory Visit (INDEPENDENT_AMBULATORY_CARE_PROVIDER_SITE_OTHER): Payer: BLUE CROSS/BLUE SHIELD | Admitting: *Deleted

## 2017-02-08 VITALS — BP 122/83 | HR 88

## 2017-02-08 DIAGNOSIS — O24415 Gestational diabetes mellitus in pregnancy, controlled by oral hypoglycemic drugs: Secondary | ICD-10-CM

## 2017-02-10 ENCOUNTER — Ambulatory Visit (HOSPITAL_COMMUNITY): Payer: BLUE CROSS/BLUE SHIELD

## 2017-02-11 ENCOUNTER — Ambulatory Visit (INDEPENDENT_AMBULATORY_CARE_PROVIDER_SITE_OTHER): Payer: BLUE CROSS/BLUE SHIELD | Admitting: Obstetrics & Gynecology

## 2017-02-11 VITALS — BP 140/80 | HR 87 | Wt 286.0 lb

## 2017-02-11 DIAGNOSIS — O3443 Maternal care for other abnormalities of cervix, third trimester: Secondary | ICD-10-CM | POA: Diagnosis not present

## 2017-02-11 DIAGNOSIS — O24415 Gestational diabetes mellitus in pregnancy, controlled by oral hypoglycemic drugs: Secondary | ICD-10-CM

## 2017-02-11 DIAGNOSIS — O09529 Supervision of elderly multigravida, unspecified trimester: Secondary | ICD-10-CM | POA: Diagnosis not present

## 2017-02-11 DIAGNOSIS — O0993 Supervision of high risk pregnancy, unspecified, third trimester: Secondary | ICD-10-CM | POA: Diagnosis not present

## 2017-02-11 DIAGNOSIS — O3440 Maternal care for other abnormalities of cervix, unspecified trimester: Secondary | ICD-10-CM

## 2017-02-11 DIAGNOSIS — Z9889 Other specified postprocedural states: Secondary | ICD-10-CM

## 2017-02-11 DIAGNOSIS — J989 Respiratory disorder, unspecified: Secondary | ICD-10-CM

## 2017-02-11 DIAGNOSIS — R0989 Other specified symptoms and signs involving the circulatory and respiratory systems: Secondary | ICD-10-CM | POA: Diagnosis not present

## 2017-02-11 DIAGNOSIS — Z98891 History of uterine scar from previous surgery: Secondary | ICD-10-CM

## 2017-02-11 DIAGNOSIS — O0990 Supervision of high risk pregnancy, unspecified, unspecified trimester: Secondary | ICD-10-CM

## 2017-02-11 NOTE — Progress Notes (Signed)
   PRENATAL VISIT NOTE  Subjective:  Teresa Perez is a 41 y.o. G6P5005 at 9w6dbeing seen today for ongoing prenatal care.  She is currently monitored for the following issues for this high-risk pregnancy and has Previous cesarean section; Cigarette smoker within last 12 months; Severe obesity (BMI >= 40) ([facility]; Antepartum multigravida of advanced maternal age; Reactive airway disease that is not asthma; Anemia; Depression; PCOS (polycystic ovarian syndrome); Insomnia; Unspecified vitamin D deficiency; Leukocytosis; Abnormal uterine bleeding; Menorrhagia; Acute bacterial bronchitis; History of conization of cervix affecting pregnancy, antepartum; Supervision of high risk pregnancy, antepartum; Gestational diabetes mellitus (GDM), antepartum; and Shortness of breath due to pregnancy in third trimester on her problem list.  Patient reports no complaints.  Contractions: Irregular. Vag. Bleeding: None.  Movement: Present. Denies leaking of fluid.   The following portions of the patient's history were reviewed and updated as appropriate: allergies, current medications, past family history, past medical history, past social history, past surgical history and problem list. Problem list updated.  Objective:   Vitals:   02/11/17 0918  BP: 140/80  Pulse: 87  Weight: 286 lb (129.7 kg)    Fetal Status: Fetal Heart Rate (bpm): 159   Movement: Present     General:  Alert, oriented and cooperative. Patient is in no acute distress.  Skin: Skin is warm and dry. No rash noted.   Cardiovascular: Normal heart rate noted  Respiratory: Normal respiratory effort, no problems with respiration noted  Abdomen: Soft, gravid, appropriate for gestational age. Pain/Pressure: Present     Pelvic:  Cervical exam deferred        Extremities: Normal range of motion.  Edema: Trace  Mental Status: Normal mood and affect. Normal behavior. Normal judgment and thought content.   Assessment and Plan:  Pregnancy: GA6T0160at  315w6d1. Supervision of high risk pregnancy, antepartum  2. Severe obesity (BMI >= 40) (H[facility])  3. Reactive airway disease that is not asthma  4. Previous cesarean section For scheduled repeat with BTL in 3 days  5. History of conization of cervix affecting pregnancy, antepartum For scheduled repeat with BTL in 3 days  6. Gestational diabetes mellitus (GDM) controlled on oral hypoglycemic drug, antepartum On Glyburide 37m68mHS. [place] controlled this past week   7. Antepartum multigravida of advanced maternal age  63. 13/o PP depression  Recommend beginning Zoloft PP 9. Tobaccos use:  Discussed tob cessation. Pt does not feel ready to quit.  Term labor symptoms and general obstetric precautions including but not limited to vaginal bleeding, contractions, leaking of fluid and fetal movement were reviewed in detail with the patient. Please refer to After Visit Summary for other counseling recommendations.  No Follow-up on file.   CarLavonia DraftsD

## 2017-02-11 NOTE — Patient Instructions (Signed)

## 2017-02-12 ENCOUNTER — Encounter (HOSPITAL_COMMUNITY)
Admission: RE | Admit: 2017-02-12 | Discharge: 2017-02-12 | Disposition: A | Discharge status: Home / Self Care | Payer: BLUE CROSS/BLUE SHIELD | Source: Ambulatory Visit | Attending: Obstetrics & Gynecology | Admitting: Obstetrics & Gynecology

## 2017-02-12 DIAGNOSIS — Z3482 Encounter for supervision of other normal pregnancy, second trimester: Secondary | ICD-10-CM | POA: Diagnosis not present

## 2017-02-12 DIAGNOSIS — Z01812 Encounter for preprocedural laboratory examination: Secondary | ICD-10-CM | POA: Diagnosis not present

## 2017-02-12 DIAGNOSIS — Z9889 Other specified postprocedural states: Secondary | ICD-10-CM | POA: Insufficient documentation

## 2017-02-12 DIAGNOSIS — Z98891 History of uterine scar from previous surgery: Secondary | ICD-10-CM | POA: Diagnosis not present

## 2017-02-12 DIAGNOSIS — E289 Ovarian dysfunction, unspecified: Secondary | ICD-10-CM | POA: Diagnosis not present

## 2017-02-12 DIAGNOSIS — F329 Major depressive disorder, single episode, unspecified: Secondary | ICD-10-CM | POA: Insufficient documentation

## 2017-02-12 DIAGNOSIS — Z3483 Encounter for supervision of other normal pregnancy, third trimester: Secondary | ICD-10-CM | POA: Diagnosis not present

## 2017-02-12 LAB — CBC
HEMATOCRIT: 32.4 % — AB (ref 36.0–46.0)
HEMOGLOBIN: 10.4 g/dL — AB (ref 12.0–15.0)
MCH: 24.5 pg — ABNORMAL LOW (ref 26.0–34.0)
MCHC: 32.1 g/dL (ref 30.0–36.0)
MCV: 76.2 fL — ABNORMAL LOW (ref 78.0–100.0)
Platelets: 299 10*3/uL (ref 150–400)
RBC: 4.25 MIL/uL (ref 3.87–5.11)
RDW: 15.8 % — AB (ref 11.5–15.5)
WBC: 14.2 10*3/uL — ABNORMAL HIGH (ref 4.0–10.5)

## 2017-02-12 NOTE — Patient Instructions (Signed)
[place]  02/12/2017   Your procedure is scheduled on:  02/14/2017  Enter through the Main Entrance of [facility] at [place] up the phone at the desk and dial [phone].   Call this number if you have problems the morning of surgery: [phone]   Remember:   Do not eat food:After Midnight.  Do not drink clear liquids: After Midnight.  Take these medicines the morning of surgery with A SIP OF WATER: none   Do not wear jewelry, make-up or nail polish.  Do not wear lotions, powders, or perfumes. Do not wear deodorant.  Do not shave 48 hours prior to surgery.  Do not bring valuables to the hospital.  [facility] is not   responsible for any belongings or valuables brought to the hospital.  Contacts, dentures or bridgework may not be worn into surgery.  Leave suitcase in the car. After surgery it may be brought to your room.  For patients admitted to the hospital, checkout time is 11:00 AM the day of              discharge.   Patients discharged the day of surgery will not be allowed to drive             home.  Name and phone number of your driver: na  Special Instructions:   N/A   Please read over the following fact sheets that you were given:   Surgical Site Infection Prevention

## 2017-02-13 ENCOUNTER — Inpatient Hospital Stay (HOSPITAL_COMMUNITY)
Admission: RE | Admit: 2017-02-13 | Discharge: 2017-02-13 | Disposition: A | Discharge status: Still In Hospital | Payer: BLUE CROSS/BLUE SHIELD | Source: Ambulatory Visit | Attending: Family Medicine | Admitting: Family Medicine

## 2017-02-13 DIAGNOSIS — Z98891 History of uterine scar from previous surgery: Secondary | ICD-10-CM

## 2017-02-13 LAB — RPR: RPR: NONREACTIVE

## 2017-02-14 ENCOUNTER — Encounter (HOSPITAL_COMMUNITY): Payer: Self-pay | Admitting: *Deleted

## 2017-02-14 ENCOUNTER — Inpatient Hospital Stay (HOSPITAL_COMMUNITY): Payer: BLUE CROSS/BLUE SHIELD | Admitting: Anesthesiology

## 2017-02-14 ENCOUNTER — Inpatient Hospital Stay (HOSPITAL_COMMUNITY)
Admission: AD | Admit: 2017-02-14 | Discharge: 2017-02-17 | DRG: 765 | Disposition: A | Discharge status: Home / Self Care | Payer: BLUE CROSS/BLUE SHIELD | Source: Intra-hospital | Attending: Obstetrics & Gynecology | Admitting: Obstetrics & Gynecology

## 2017-02-14 ENCOUNTER — Encounter (HOSPITAL_COMMUNITY)
Admission: AD | Disposition: A | Discharge status: Home / Self Care | Payer: Self-pay | Attending: Obstetrics & Gynecology

## 2017-02-14 DIAGNOSIS — O99214 Obesity complicating childbirth: Secondary | ICD-10-CM | POA: Diagnosis present

## 2017-02-14 DIAGNOSIS — Z3A39 39 weeks gestation of pregnancy: Secondary | ICD-10-CM | POA: Diagnosis not present

## 2017-02-14 DIAGNOSIS — Z8759 Personal history of other complications of pregnancy, childbirth and the puerperium: Secondary | ICD-10-CM

## 2017-02-14 DIAGNOSIS — Z91040 Latex allergy status: Secondary | ICD-10-CM

## 2017-02-14 DIAGNOSIS — O24425 Gestational diabetes mellitus in childbirth, controlled by oral hypoglycemic drugs: Secondary | ICD-10-CM | POA: Diagnosis not present

## 2017-02-14 DIAGNOSIS — Z6841 Body Mass Index (BMI) 40.0 and over, adult: Secondary | ICD-10-CM

## 2017-02-14 DIAGNOSIS — Z98891 History of uterine scar from previous surgery: Secondary | ICD-10-CM

## 2017-02-14 DIAGNOSIS — O24419 Gestational diabetes mellitus in pregnancy, unspecified control: Secondary | ICD-10-CM | POA: Diagnosis present

## 2017-02-14 DIAGNOSIS — O135 Gestational [pregnancy-induced] hypertension without significant proteinuria, complicating the puerperium: Secondary | ICD-10-CM | POA: Diagnosis not present

## 2017-02-14 DIAGNOSIS — Z302 Encounter for sterilization: Secondary | ICD-10-CM

## 2017-02-14 DIAGNOSIS — O34211 Maternal care for low transverse scar from previous cesarean delivery: Secondary | ICD-10-CM | POA: Diagnosis not present

## 2017-02-14 DIAGNOSIS — O26893 Other specified pregnancy related conditions, third trimester: Secondary | ICD-10-CM

## 2017-02-14 DIAGNOSIS — D649 Anemia, unspecified: Secondary | ICD-10-CM | POA: Diagnosis present

## 2017-02-14 DIAGNOSIS — O24415 Gestational diabetes mellitus in pregnancy, controlled by oral hypoglycemic drugs: Secondary | ICD-10-CM

## 2017-02-14 DIAGNOSIS — O09529 Supervision of elderly multigravida, unspecified trimester: Secondary | ICD-10-CM

## 2017-02-14 DIAGNOSIS — O9902 Anemia complicating childbirth: Secondary | ICD-10-CM | POA: Diagnosis present

## 2017-02-14 DIAGNOSIS — Z8659 Personal history of other mental and behavioral disorders: Secondary | ICD-10-CM

## 2017-02-14 DIAGNOSIS — F17210 Nicotine dependence, cigarettes, uncomplicated: Secondary | ICD-10-CM | POA: Diagnosis not present

## 2017-02-14 DIAGNOSIS — O99334 Smoking (tobacco) complicating childbirth: Secondary | ICD-10-CM | POA: Diagnosis not present

## 2017-02-14 DIAGNOSIS — R0602 Shortness of breath: Secondary | ICD-10-CM

## 2017-02-14 HISTORY — DX: Gestational diabetes mellitus in pregnancy, unspecified control: O24.419

## 2017-02-14 HISTORY — DX: Acute bronchitis due to other specified organisms: J20.8

## 2017-02-14 HISTORY — DX: Excessive and frequent menstruation with regular cycle: N92.0

## 2017-02-14 HISTORY — DX: Other specified bacterial agents as the cause of diseases classified elsewhere: B96.89

## 2017-02-14 LAB — GLUCOSE, CAPILLARY
GLUCOSE-CAPILLARY: 59 mg/dL — AB (ref 65–99)
GLUCOSE-CAPILLARY: 120 mg/dL — AB (ref 65–99)

## 2017-02-14 LAB — PREPARE RBC (CROSSMATCH)

## 2017-02-14 SURGERY — Surgical Case
Anesthesia: Spinal | Site: Abdomen | Laterality: Bilateral | Wound class: Clean Contaminated

## 2017-02-14 MED ORDER — ZOLPIDEM TARTRATE 5 MG PO TABS: 5.0000 mg | ORAL_TABLET | Freq: Every evening | ORAL | Status: DC | PRN

## 2017-02-14 MED ORDER — MEPERIDINE HCL 25 MG/ML IJ SOLN: 6.2500 mg | INTRAMUSCULAR | Status: DC | PRN

## 2017-02-14 MED ORDER — WITCH HAZEL-GLYCERIN EX PADS: 1.0000 "application " | MEDICATED_PAD | CUTANEOUS | Status: DC | PRN

## 2017-02-14 MED ORDER — NALOXONE HCL 2 MG/2ML IJ SOSY
1.0000 ug/kg/h | PREFILLED_SYRINGE | INTRAVENOUS | Status: DC | PRN
  Filled 2017-02-14: qty 2

## 2017-02-14 MED ORDER — BUPIVACAINE HCL (PF) 0.25 % IJ SOLN
INTRAMUSCULAR | Status: AC
  Filled 2017-02-14: qty 30

## 2017-02-14 MED ORDER — ONDANSETRON HCL 4 MG/2ML IJ SOLN
INTRAMUSCULAR | Status: AC
  Filled 2017-02-14: qty 2

## 2017-02-14 MED ORDER — KETOROLAC TROMETHAMINE 30 MG/ML IJ SOLN
30.0000 mg | Freq: Four times a day (QID) | INTRAMUSCULAR | Status: AC | PRN
  Administered 2017-02-14: 30 mg via INTRAMUSCULAR

## 2017-02-14 MED ORDER — DEXTROSE 5 % IV SOLN
2.0000 g | INTRAVENOUS | Status: DC
  Filled 2017-02-14: qty 2

## 2017-02-14 MED ORDER — SCOPOLAMINE 1 MG/3DAYS TD PT72: 1.0000 | MEDICATED_PATCH | Freq: Once | TRANSDERMAL | Status: DC

## 2017-02-14 MED ORDER — IBUPROFEN 600 MG PO TABS
600.0000 mg | ORAL_TABLET | Freq: Four times a day (QID) | ORAL | Status: DC
  Administered 2017-02-15 – 2017-02-17 (×12): 600 mg via ORAL
  Filled 2017-02-14 (×12): qty 1

## 2017-02-14 MED ORDER — SERTRALINE HCL 100 MG PO TABS
100.0000 mg | ORAL_TABLET | Freq: Every day | ORAL | Status: DC
  Administered 2017-02-15 – 2017-02-17 (×3): 100 mg via ORAL
  Filled 2017-02-14 (×4): qty 1

## 2017-02-14 MED ORDER — MORPHINE SULFATE (PF) 0.5 MG/ML IJ SOLN
INTRAMUSCULAR | Status: DC | PRN
  Administered 2017-02-14: .2 mg via INTRATHECAL

## 2017-02-14 MED ORDER — FERROUS SULFATE 325 (65 FE) MG PO TABS
325.0000 mg | ORAL_TABLET | Freq: Two times a day (BID) | ORAL | Status: DC
  Administered 2017-02-15 (×2): 325 mg via ORAL
  Filled 2017-02-14 (×2): qty 1

## 2017-02-14 MED ORDER — COCONUT OIL OIL: 1.0000 "application " | TOPICAL_OIL | Status: DC | PRN

## 2017-02-14 MED ORDER — PRENATAL MULTIVITAMIN CH
1.0000 | ORAL_TABLET | Freq: Every day | ORAL | Status: DC
  Administered 2017-02-15 – 2017-02-17 (×3): 1 via ORAL
  Filled 2017-02-14 (×3): qty 1

## 2017-02-14 MED ORDER — NALBUPHINE HCL 10 MG/ML IJ SOLN: 5.0000 mg | Freq: Once | INTRAMUSCULAR | Status: DC | PRN

## 2017-02-14 MED ORDER — KETOROLAC TROMETHAMINE 30 MG/ML IJ SOLN
INTRAMUSCULAR | Status: AC
Start: 2017-02-14 — End: 2017-02-15
  Filled 2017-02-14: qty 1

## 2017-02-14 MED ORDER — ONDANSETRON HCL 4 MG/2ML IJ SOLN: 4.0000 mg | Freq: Four times a day (QID) | INTRAMUSCULAR | Status: DC | PRN

## 2017-02-14 MED ORDER — SENNOSIDES-DOCUSATE SODIUM 8.6-50 MG PO TABS
2.0000 | ORAL_TABLET | ORAL | Status: DC
  Administered 2017-02-15 – 2017-02-16 (×3): 2 via ORAL
  Filled 2017-02-14 (×3): qty 2

## 2017-02-14 MED ORDER — LACTATED RINGERS IV SOLN
INTRAVENOUS | Status: DC | PRN
  Administered 2017-02-14: 12:00:00 via INTRAVENOUS

## 2017-02-14 MED ORDER — OXYCODONE-ACETAMINOPHEN 5-325 MG PO TABS
2.0000 | ORAL_TABLET | ORAL | Status: DC | PRN
  Administered 2017-02-16: 2 via ORAL
  Filled 2017-02-14: qty 2

## 2017-02-14 MED ORDER — ONDANSETRON HCL 4 MG/2ML IJ SOLN
4.0000 mg | Freq: Three times a day (TID) | INTRAMUSCULAR | Status: DC | PRN
  Administered 2017-02-14: 4 mg via INTRAVENOUS
  Filled 2017-02-14: qty 2

## 2017-02-14 MED ORDER — SIMETHICONE 80 MG PO CHEW
80.0000 mg | CHEWABLE_TABLET | ORAL | Status: DC
  Administered 2017-02-15 – 2017-02-16 (×3): 80 mg via ORAL
  Filled 2017-02-14 (×2): qty 1

## 2017-02-14 MED ORDER — SCOPOLAMINE 1 MG/3DAYS TD PT72
1.0000 | MEDICATED_PATCH | TRANSDERMAL | Status: DC
  Administered 2017-02-14: 1.5 mg via TRANSDERMAL
  Filled 2017-02-14: qty 1

## 2017-02-14 MED ORDER — BUPIVACAINE IN DEXTROSE 0.75-8.25 % IT SOLN
INTRATHECAL | Status: DC | PRN
  Administered 2017-02-14: 1.6 mL via INTRATHECAL

## 2017-02-14 MED ORDER — OXYCODONE-ACETAMINOPHEN 5-325 MG PO TABS
1.0000 | ORAL_TABLET | ORAL | Status: DC | PRN
  Administered 2017-02-15 – 2017-02-17 (×9): 1 via ORAL
  Filled 2017-02-14 (×9): qty 1

## 2017-02-14 MED ORDER — CEFAZOLIN SODIUM 10 G IJ SOLR
INTRAMUSCULAR | Status: DC | PRN
  Administered 2017-02-14: 3 g via INTRAVENOUS

## 2017-02-14 MED ORDER — MAGNESIUM HYDROXIDE 400 MG/5ML PO SUSP: 30.0000 mL | ORAL | Status: DC | PRN

## 2017-02-14 MED ORDER — OXYTOCIN 10 UNIT/ML IJ SOLN
INTRAVENOUS | Status: DC | PRN
  Administered 2017-02-14: 40 [IU] via INTRAVENOUS

## 2017-02-14 MED ORDER — ACETAMINOPHEN 325 MG PO TABS: 650.0000 mg | ORAL_TABLET | ORAL | Status: DC | PRN

## 2017-02-14 MED ORDER — SODIUM CHLORIDE 0.9% FLUSH
3.0000 mL | INTRAVENOUS | Status: DC | PRN
  Administered 2017-02-15: 3 mL via INTRAVENOUS
  Filled 2017-02-14: qty 3

## 2017-02-14 MED ORDER — NALBUPHINE HCL 10 MG/ML IJ SOLN: 5.0000 mg | INTRAMUSCULAR | Status: DC | PRN

## 2017-02-14 MED ORDER — ONDANSETRON HCL 4 MG/2ML IJ SOLN
INTRAMUSCULAR | Status: DC | PRN
  Administered 2017-02-14: 4 mg via INTRAVENOUS

## 2017-02-14 MED ORDER — ALBUTEROL SULFATE HFA 108 (90 BASE) MCG/ACT IN AERS
2.0000 | INHALATION_SPRAY | Freq: Four times a day (QID) | RESPIRATORY_TRACT | Status: DC | PRN
  Filled 2017-02-14: qty 6.7

## 2017-02-14 MED ORDER — ALBUTEROL SULFATE (2.5 MG/3ML) 0.083% IN NEBU: 2.5000 mg | INHALATION_SOLUTION | Freq: Four times a day (QID) | RESPIRATORY_TRACT | Status: DC | PRN

## 2017-02-14 MED ORDER — SODIUM CHLORIDE 0.9 % IV SOLN
INTRAVENOUS | Status: DC | PRN
  Administered 2017-02-14: 11:00:00 via INTRAVENOUS

## 2017-02-14 MED ORDER — OXYTOCIN 40 UNITS IN LACTATED RINGERS INFUSION - SIMPLE MED: 2.5000 [IU]/h | INTRAVENOUS | Status: AC

## 2017-02-14 MED ORDER — KETOROLAC TROMETHAMINE 30 MG/ML IJ SOLN: 30.0000 mg | Freq: Four times a day (QID) | INTRAMUSCULAR | Status: AC | PRN

## 2017-02-14 MED ORDER — TETANUS-DIPHTH-ACELL PERTUSSIS 5-2.5-18.5 LF-MCG/0.5 IM SUSP: 0.5000 mL | Freq: Once | INTRAMUSCULAR | Status: DC

## 2017-02-14 MED ORDER — PHENYLEPHRINE 8 MG IN D5W 100 ML (0.08MG/ML) PREMIX OPTIME
INJECTION | INTRAVENOUS | Status: AC
  Filled 2017-02-14: qty 100

## 2017-02-14 MED ORDER — DIPHENHYDRAMINE HCL 25 MG PO CAPS: 25.0000 mg | ORAL_CAPSULE | Freq: Four times a day (QID) | ORAL | Status: DC | PRN

## 2017-02-14 MED ORDER — PHENYLEPHRINE 8 MG IN D5W 100 ML (0.08MG/ML) PREMIX OPTIME
INJECTION | INTRAVENOUS | Status: DC | PRN
  Administered 2017-02-14: 60 ug/min via INTRAVENOUS

## 2017-02-14 MED ORDER — FENTANYL CITRATE (PF) 100 MCG/2ML IJ SOLN
25.0000 ug | INTRAMUSCULAR | Status: DC | PRN
  Administered 2017-02-14 (×2): 50 ug via INTRAVENOUS

## 2017-02-14 MED ORDER — OXYCODONE HCL 5 MG PO TABS: 5.0000 mg | ORAL_TABLET | Freq: Once | ORAL | Status: DC | PRN

## 2017-02-14 MED ORDER — NALOXONE HCL 0.4 MG/ML IJ SOLN: 0.4000 mg | INTRAMUSCULAR | Status: DC | PRN

## 2017-02-14 MED ORDER — SIMETHICONE 80 MG PO CHEW
80.0000 mg | CHEWABLE_TABLET | ORAL | Status: DC | PRN
  Administered 2017-02-15 (×2): 80 mg via ORAL
  Filled 2017-02-14 (×2): qty 1

## 2017-02-14 MED ORDER — MORPHINE SULFATE (PF) 0.5 MG/ML IJ SOLN
INTRAMUSCULAR | Status: AC
  Filled 2017-02-14: qty 10

## 2017-02-14 MED ORDER — BUPIVACAINE HCL (PF) 0.5 % IJ SOLN
INTRAMUSCULAR | Status: DC | PRN
Start: 2017-02-14 — End: 2017-02-14
  Administered 2017-02-14: 30 mL

## 2017-02-14 MED ORDER — MENTHOL 3 MG MT LOZG: 1.0000 | LOZENGE | OROMUCOSAL | Status: DC | PRN

## 2017-02-14 MED ORDER — LACTATED RINGERS IV SOLN: INTRAVENOUS | Status: DC

## 2017-02-14 MED ORDER — OXYCODONE HCL 5 MG/5ML PO SOLN: 5.0000 mg | Freq: Once | ORAL | Status: DC | PRN

## 2017-02-14 MED ORDER — DIPHENHYDRAMINE HCL 50 MG/ML IJ SOLN: 12.5000 mg | INTRAMUSCULAR | Status: DC | PRN

## 2017-02-14 MED ORDER — OXYTOCIN 10 UNIT/ML IJ SOLN
INTRAMUSCULAR | Status: AC
  Filled 2017-02-14: qty 4

## 2017-02-14 MED ORDER — LACTATED RINGERS IV SOLN
INTRAVENOUS | Status: DC
  Administered 2017-02-14: 21:00:00 via INTRAVENOUS

## 2017-02-14 MED ORDER — DIBUCAINE 1 % RE OINT: 1.0000 "application " | TOPICAL_OINTMENT | RECTAL | Status: DC | PRN

## 2017-02-14 MED ORDER — FENTANYL CITRATE (PF) 100 MCG/2ML IJ SOLN
INTRAMUSCULAR | Status: AC
  Filled 2017-02-14: qty 2

## 2017-02-14 MED ORDER — SOD CITRATE-CITRIC ACID 500-334 MG/5ML PO SOLN
30.0000 mL | Freq: Once | ORAL | Status: AC
  Administered 2017-02-14: 30 mL via ORAL
  Filled 2017-02-14: qty 15

## 2017-02-14 MED ORDER — DIPHENHYDRAMINE HCL 25 MG PO CAPS
25.0000 mg | ORAL_CAPSULE | ORAL | Status: DC | PRN
  Filled 2017-02-14: qty 1

## 2017-02-14 MED ORDER — CEFAZOLIN SODIUM 10 G IJ SOLR
INTRAMUSCULAR | Status: AC
  Filled 2017-02-14: qty 3000

## 2017-02-14 MED ORDER — FENTANYL CITRATE (PF) 100 MCG/2ML IJ SOLN
INTRAMUSCULAR | Status: DC | PRN
  Administered 2017-02-14: 10 ug via INTRATHECAL

## 2017-02-14 MED ORDER — MEASLES, MUMPS & RUBELLA VAC ~~LOC~~ INJ
0.5000 mL | INJECTION | Freq: Once | SUBCUTANEOUS | Status: DC
  Filled 2017-02-14: qty 0.5

## 2017-02-14 SURGICAL SUPPLY — 36 items
BENZOIN TINCTURE PRP APPL 2/3 (GAUZE/BANDAGES/DRESSINGS) ×2 IMPLANT
CHLORAPREP W/TINT 26ML (MISCELLANEOUS) ×2 IMPLANT
CLAMP CORD UMBIL (MISCELLANEOUS) ×2 IMPLANT
CLOTH BEACON ORANGE TIMEOUT ST (SAFETY) ×2 IMPLANT
DRESSING DISP NPWT PICO 4X12 (MISCELLANEOUS) ×2 IMPLANT
DRSG OPSITE POSTOP 4X10 (GAUZE/BANDAGES/DRESSINGS) IMPLANT
ELECT REM PT RETURN 9FT ADLT (ELECTROSURGICAL) ×2
ELECTRODE REM PT RTRN 9FT ADLT (ELECTROSURGICAL) ×1 IMPLANT
EXTRACTOR VACUUM M CUP 4 TUBE (SUCTIONS) IMPLANT
GLOVE BIOGEL PI IND STRL 7.0 (GLOVE) ×3 IMPLANT
GLOVE BIOGEL PI INDICATOR 7.0 (GLOVE) ×3
GLOVE ECLIPSE 7.0 STRL STRAW (GLOVE) ×2 IMPLANT
GOWN STRL REUS W/TWL LRG LVL3 (GOWN DISPOSABLE) ×4 IMPLANT
HOVERMATT SINGLE USE (MISCELLANEOUS) ×2 IMPLANT
KIT ABG SYR 3ML LUER SLIP (SYRINGE) IMPLANT
NEEDLE HYPO 22GX1.5 SAFETY (NEEDLE) ×2 IMPLANT
NEEDLE HYPO 25X5/8 SAFETYGLIDE (NEEDLE) ×6 IMPLANT
NS IRRIG 1000ML POUR BTL (IV SOLUTION) ×2 IMPLANT
PACK C SECTION WH (CUSTOM PROCEDURE TRAY) ×2 IMPLANT
PAD ABD 7.5X8 STRL (GAUZE/BANDAGES/DRESSINGS) ×2 IMPLANT
PAD OB MATERNITY 4.3X12.25 (PERSONAL CARE ITEMS) ×2 IMPLANT
PENCIL SMOKE EVAC W/HOLSTER (ELECTROSURGICAL) ×2 IMPLANT
RETRACTOR WND ALEXIS 25 LRG (MISCELLANEOUS) ×1 IMPLANT
RTRCTR C-SECT PINK 25CM LRG (MISCELLANEOUS) IMPLANT
RTRCTR WOUND ALEXIS 25CM LRG (MISCELLANEOUS) ×2
SPONGE GAUZE 4X4 12PLY STER LF (GAUZE/BANDAGES/DRESSINGS) ×4 IMPLANT
STRIP CLOSURE SKIN 1/2X4 (GAUZE/BANDAGES/DRESSINGS) ×2 IMPLANT
SUT PDS AB 0 CTX 36 PDP370T (SUTURE) IMPLANT
SUT PLAIN 2 0 XLH (SUTURE) ×2 IMPLANT
SUT VIC AB 0 CTX 36 (SUTURE) ×2
SUT VIC AB 0 CTX36XBRD ANBCTRL (SUTURE) ×2 IMPLANT
SUT VIC AB 2-0 CT1 (SUTURE) ×6 IMPLANT
SUT VIC AB 4-0 KS 27 (SUTURE) ×2 IMPLANT
SYR CONTROL 10ML LL (SYRINGE) ×2 IMPLANT
TOWEL OR 17X24 6PK STRL BLUE (TOWEL DISPOSABLE) ×2 IMPLANT
TRAY FOLEY BAG SILVER LF 14FR (SET/KITS/TRAYS/PACK) ×2 IMPLANT

## 2017-02-14 NOTE — Progress Notes (Signed)
Bair Hugger applied to pt by LD RN  per Dr Harolyn Rutherford. Will continue to monitor pt.

## 2017-02-14 NOTE — Anesthesia Postprocedure Evaluation (Signed)
Anesthesia Post Note  Patient: [name]  Procedure(s) Performed: Procedure(s) (LRB): CESAREAN SECTION WITH BILATERAL TUBAL LIGATION (Bilateral)     Patient location during evaluation: Mother Baby Anesthesia Type: Spinal Level of consciousness: awake Pain management: pain level controlled Vital Signs Assessment: post-procedure vital signs reviewed and stable Respiratory status: spontaneous breathing Cardiovascular status: stable Postop Assessment: no headache, no backache, patient able to bend at knees, no signs of nausea or vomiting, adequate PO intake and spinal receding Anesthetic complications: no    Last Vitals:  Vitals:   02/14/17 1445 02/14/17 1458  BP: 136/79 124/73  Pulse: (!) 57 (!) 58  Resp: 17 17  Temp:  36.5 C    Last Pain:  Vitals:   02/14/17 1458  TempSrc: Oral  PainSc: 3    Pain Goal: Patients Stated Pain Goal: 3 (02/14/17 1458)               Barkley Boards

## 2017-02-14 NOTE — Anesthesia Procedure Notes (Signed)
Spinal  Patient location during procedure: OR Start time: 02/14/2017 11:42 AM End time: 02/14/2017 11:46 AM Staffing Anesthesiologist: Marcie Bal, Jamielynn Wigley Performed: anesthesiologist  Preanesthetic Checklist Completed: patient identified, surgical consent, pre-op evaluation, timeout performed, IV checked, risks and benefits discussed and monitors and equipment checked Spinal Block Patient position: sitting Prep: DuraPrep Patient monitoring: cardiac monitor, continuous pulse ox and blood pressure Approach: midline Location: L3-4 Injection technique: single-shot Needle Needle type: Pencan  Needle gauge: 24 G Needle length: 9 cm Assessment Sensory level: T10 Additional Notes Functioning IV was confirmed and monitors were applied. Sterile prep and drape, including hand hygiene and sterile gloves were used. The patient was positioned and the spine was prepped. The skin was anesthetized with lidocaine.  Free flow of clear CSF was obtained prior to injecting local anesthetic into the CSF.  The spinal needle aspirated freely following injection.  The needle was carefully withdrawn.  The patient tolerated the procedure well.

## 2017-02-14 NOTE — Lactation Note (Signed)
This note was copied from a baby's chart. Lactation Consultation Note  Patient Name: Boy Ameri Cahoon [id number] Date: 02/14/2017 Reason for consult: Initial assessment  Baby 32 hours old. Assisted mom to latch baby to right breast in football position. Mom able to hand express with colostrum flowing bilaterally. Baby latched deeply to right breast and suckled rhythmically with some swallows noted. Enc mom to offer STS and nurse with cues. Enc nursing STS to keep baby suckling. Discussed ways of stimulating baby to keep suckling at breast. Mom given [facility] brochure, aware of OP/BFSG and [place] phone line assistance after D/C.   Maternal Data Has patient been taught Hand Expression?: Yes Does the patient have breastfeeding experience prior to this delivery?: Yes  Feeding Feeding Type: Breast Fed  LATCH Score/Interventions Latch: Repeated attempts needed to sustain latch, nipple held in mouth throughout feeding, stimulation needed to elicit sucking reflex. Intervention(s): Adjust position;Assist with latch;Breast compression  Audible Swallowing: A few with stimulation Intervention(s): Skin to skin;Hand expression  Type of Nipple: Everted at rest and after stimulation  Comfort (Breast/Nipple): Soft / non-tender     Hold (Positioning): Assistance needed to correctly position infant at breast and maintain latch. Intervention(s): Breastfeeding basics reviewed;Support Pillows;Position options;Skin to skin  LATCH Score: 7  Lactation Tools Discussed/Used     Consult Status Consult Status: Follow-up Date: 02/15/17 Follow-up type: In-patient    Andres Labrum 02/14/2017, 11:08 PM

## 2017-02-14 NOTE — Transfer of Care (Signed)
Immediate Anesthesia Transfer of Care Note  Patient: Teresa Perez  Procedure(s) Performed: Procedure(s): CESAREAN SECTION WITH BILATERAL TUBAL LIGATION (Bilateral)  Patient Location: PACU  Anesthesia Type:Spinal  Level of Consciousness: awake and alert   Airway & Oxygen Therapy: Patient Spontanous Breathing  Post-op Assessment: Report given to RN and Post -op Vital signs reviewed and stable  Post vital signs: Reviewed  Last Vitals:  Vitals:   02/14/17 1003  BP: (!) 142/81  Pulse: 86  Temp: 36.6 C    Last Pain:  Vitals:   02/14/17 1003  TempSrc: Oral         Complications: No apparent anesthesia complications

## 2017-02-14 NOTE — Anesthesia Preprocedure Evaluation (Signed)
Anesthesia Evaluation  Patient identified by MRN, date of birth, ID band Patient awake    Reviewed: Allergy & Precautions, H&P , NPO status , Patient's Chart, lab work & pertinent test results  Airway Mallampati: II   Neck ROM: full    Dental   Pulmonary asthma , Current Smoker,    breath sounds clear to auscultation       Cardiovascular hypertension,  Rhythm:regular Rate:Normal     Neuro/Psych  Headaches, PSYCHIATRIC DISORDERS Anxiety Depression    GI/Hepatic   Endo/Other  diabetes, GestationalMorbid obesity  Renal/GU      Musculoskeletal   Abdominal   Peds  Hematology   Anesthesia Other Findings   Reproductive/Obstetrics                             Anesthesia Physical Anesthesia Plan  ASA: II  Anesthesia Plan: Spinal   Post-op Pain Management:    Induction: Intravenous  PONV Risk Score and Plan: 1 and Ondansetron and Treatment may vary due to age or medical condition  Airway Management Planned: Nasal Cannula  Additional Equipment:   Intra-op Plan:   Post-operative Plan:   Informed Consent: I have reviewed the patients History and Physical, chart, labs and discussed the procedure including the risks, benefits and alternatives for the proposed anesthesia with the patient or authorized representative who has indicated his/her understanding and acceptance.     Plan Discussed with: CRNA, Anesthesiologist and Surgeon  Anesthesia Plan Comments:         Anesthesia Quick Evaluation

## 2017-02-14 NOTE — H&P (Signed)
Obstetric Preoperative History and Physical  Teresa Perez is a 41 y.o. [id number] with IUP at 14w2dpresenting for scheduled repeat cesarean section and bilateral tubal sterilization.  Previous cesarean section x 4, no remarkable adhesive disease mentioned on previous notes. No acute concerns.   [facility]: Patient Active Problem List   Diagnosis Date Noted  . History of postpartum depression 02/14/2017  . Gestational diabetes mellitus (GDM), antepartum 08/17/2016  . History of conization of cervix affecting pregnancy, antepartum 08/05/2016  . Supervision of high risk pregnancy, antepartum 08/05/2016  . Depression 01/02/2014  . PCOS (polycystic ovarian syndrome) 01/02/2014  . Severe obesity (BMI >= 40) ([place] 06/22/2013  . Antepartum multigravida of advanced maternal age 78/23/2014  . Previous cesarean section x 4 07/03/2011  She plans to breastfeed, plans to bottle feed  Prenatal labs and studies: ABO, Rh: --/--/AB POS (06/15 1005) Antibody: NEG (06/15 1005) Rubella: <0.90 (12/06 1706) RPR: Non Reactive (06/15 1005)  HBsAg: NEGATIVE (12/06 1706)  HIV: Non Reactive (04/05 0947)  GBS:  Negative 2 hr Glucola  normal Genetic screening normal Anatomy UK[place]ormal   Past Medical History:  Diagnosis Date  . Acute bacterial bronchitis 11/05/2015  . Anemia   . Anxiety   . Asthma    childhood asthma  . Boil    tendency to form boils  . Depression    has been treated in past-no present meds  . Family history of anesthesia complication 21540  son- age 42- "had lockjaw" after surgery (T&A)  . Gestational diabetes mellitus (GDM), antepartum 08/17/2016   Fasting on 2 hour was 107  . Headache(784.0)   . Menorrhagia 10/29/2015  . PCOS (polycystic ovarian syndrome)   . Pneumonia    AS A CHILD    Past Surgical History:  Procedure Laterality Date  . CERVICAL CONIZATION W/BX N/A 10/31/2015   Procedure: CONIZATION CERVIX WITH BIOPSY;   Surgeon: VEldred Manges MD;  [place];  Service: Gynecology;  Laterality: N/A;  . CESAREAN SECTION     x2 previous  . CESAREAN SECTION  07/03/2011   Procedure: CESAREAN SECTION;  Surgeon: SAlwyn Pea MD;  [place] [facility];  Service: Gynecology;  Laterality: N/A;  . CESAREAN SECTION N/A 06/22/2013   Procedure: CESAREAN SECTION, repeat;  Surgeon: VEldred Manges MD;  [place] [facility];  Service: Obstetrics;  Laterality: N/A;  . IUD REMOVAL N/A 10/31/2015   Procedure: INTRAUTERINE DEVICE (IUD) REMOVAL;  Surgeon: VEldred Manges MD;  [place];  Service: Gynecology;  Laterality: N/A;  . MOUTH SURGERY  2004    OB History  Gravida Para Term Preterm AB Living  _0 0 0 5  SAB TAB Ectopic Multiple Live Births  0 0 0 0 5    # Outcome Date GA Lbr Len/2nd Weight Sex Delivery Anes PTL Lv  6 Current           5 Term 06/22/13 360w0d6 lb 8.1 oz (2.95 kg) F CS-LTranv EPI  LIV  4 Term 07/03/11 4073w0d lb 15.3 oz (3.155 kg) F CS-LTranv Spinal  LIV     Birth Comments: normal AGA term  3 Term 04/2011    F CS-Unspec Gen  LIV     Birth Comments: fetal distress, placenta abruptio  2 Term 07/1998   8 lb 1 oz (3.657 kg) M Vag-Spont   LIV  1 Term 04/1995   8 lb 8 oz (  3.856 kg) M CS-Unspec EPI  LIV      Social History   Social History  . Marital status: Single    Spouse name: N/A  . Number of children: N/A  . Years of education: N/A   Social History Main Topics  . Smoking status: Current Every Day Smoker    Packs/day: 0.25    Years: 16.00    Types: Cigarettes  . Smokeless tobacco: Never Used  . Alcohol use No     Comment: occasional   . Drug use: No  . Sexual activity: Yes    Birth control/ protection: None   Other Topics Concern  . None   Social History Narrative  . None    Family History  Problem Relation Age of Onset  . Anesthesia problems Son        son-had T&A-2008-had trismus  . Diabetes Mother   . Hypertension Mother   . Hyperlipidemia Mother    . Arthritis Mother   . Anxiety disorder Mother   . Hypertension Father   . Hypertension Paternal Grandmother   . Hyperlipidemia Paternal Grandmother   . Depression Neg Hx     Prescriptions Prior to Admission  Medication Sig Dispense Refill Last Dose  . ACCU-CHEK FASTCLIX LANCETS MISC 1 Units by Percutaneous route 4 (four) times daily. 100 each 12 Taking  . albuterol (PROVENTIL HFA;VENTOLIN HFA) 108 (90 Base) MCG/ACT inhaler Inhale 2 puffs into the lungs every 6 (six) hours as needed for wheezing or shortness of breath. 1 Inhaler 2 Taking  . azithromycin (ZITHROMAX) 250 MG tablet Two tabs day one, then one tab daily days 2-5. (Patient not taking: Reported on 02/11/2017) 6 each 0 Not Taking  . benzonatate (TESSALON PERLES) 100 MG capsule Take 1 capsule (100 mg total) by mouth 3 (three) times daily as needed for cough. (Patient not taking: Reported on 02/11/2017) 15 capsule 0 Not Taking  . Blood Glucose Monitoring Suppl (ACCU-CHEK NANO SMARTVIEW) w/Device KIT 1 kit by Subdermal route as directed. Check blood sugars for fasting, and two hours after breakfast, lunch and dinner (4 checks daily) 1 kit 0 Taking  . glucose blood (ACCU-CHEK SMARTVIEW) test strip Use as instructed to check blood sugars 100 each 12 Taking  . glyBURIDE (DIABETA) 5 MG tablet Take 1.5 tablets (7.5 mg total) by mouth daily after supper. (Patient taking differently: Take 5 mg by mouth daily after supper. ) 90 tablet 2 Taking  . HALOG 0.1 % OINT Apply 1 application topically 2 (two) times daily as needed (psoriasis).   1 Taking  . Prenatal Vit-Fe Fumarate-FA (PREPLUS) 27-1 MG TABS Take 1 tablet by mouth daily. 30 tablet 13 Taking    Allergies  Allergen Reactions  . Latex Rash and Itching    Review of Systems: Negative except for what is mentioned in HPI.  Physical Exam: BP (!) 142/81   Pulse 86   Temp 97.9 F (36.6 C) (Oral)   Ht 5' 6" (1.676 m)   Wt 286 lb (129.7 kg)   LMP 05/15/2016 (Approximate)   BMI 46.16  kg/m  FHR by Doppler: 150 bpm CONSTITUTIONAL: Well-developed, well-nourished female in no acute distress.  HENT:  Normocephalic, atraumatic, External right and left ear normal. Oropharynx is clear and moist EYES: Conjunctivae and EOM are normal. Pupils are equal, round, and reactive to light. No scleral icterus.  NECK: Normal range of motion, supple, no masses SKIN: Skin is warm and dry. No rash noted. Not diaphoretic. No erythema. No pallor. [place]: Alert  and oriented to person, place, and time. Normal reflexes, muscle tone coordination. No cranial nerve deficit noted. PSYCHIATRIC: Normal mood and affect. Normal behavior. Normal judgment and thought content. CARDIOVASCULAR: Normal heart rate noted, regular rhythm RESPIRATORY: Effort and breath sounds normal, no problems with respiration noted ABDOMEN: Soft, nontender, nondistended, gravid. Well-healed Pfannenstiel incisions. PELVIC: Deferred MUSCULOSKELETAL: Normal range of motion. No edema and no tenderness. 2+ distal pulses.   Pertinent Labs/Studies:   Results for orders placed or performed during the hospital encounter of 02/14/17 (from the past 72 hour(s))  Prepare RBC (crossmatch)     Status: None   Collection Time: 02/14/17 11:00 AM  Result Value Ref Range   Order Confirmation ORDER PROCESSED BY BLOOD BANK     Assessment and Plan :Teresa Perez is a 41 y.o. [id number] at 75w2dbeing admitted for scheduled repeat cesarean section and bilateral tubal sterilization.   The risks of cesarean section were discussed with the patient including but were not limited to: bleeding which may require transfusion or reoperation; infection which may require antibiotics; injury to bowel, bladder, ureters or other surrounding organs; injury to the fetus; need for additional procedures including hysterectomy in the event of a life-threatening hemorrhage; placental abnormalities wth subsequent pregnancies, incisional problems, thromboembolic phenomenon and  other postoperative/anesthesia complications.  Patient also desires permanent sterilization.  Other reversible forms of contraception were discussed with patient; she declines all other modalities. Risks of procedure discussed with patient including but not limited to: risk of regret, permanence of method, bleeding, infection, injury to surrounding organs and need for additional procedures.  Failure risk of 1-2% with increased risk of ectopic gestation if pregnancy occurs was also discussed with patient.  The patient concurred with the proposed plan, giving informed written consent for the procedures.  Patient has been NPO since last night she will remain NPO for procedure. Anesthesia and OR aware. Preoperative prophylactic antibiotics and SCDs ordered on call to the OR. To OR when ready.    UVerita Schneiders MD, [place] Attending [place] [facility]

## 2017-02-14 NOTE — Op Note (Signed)
Aolanis Arvidson PROCEDURE DATE: 02/14/2017  PREOPERATIVE DIAGNOSES: Intrauterine pregnancy at 55w2dweeks gestation; previous cesarean section x 4; undesired fertility; undesired fertility  POSTOPERATIVE DIAGNOSES: The same  PROCEDURE: Repeat Low Transverse Cesarean Section, Bilateral Tubal Sterilization in the [facility] Method  SURGEON:  Dr. UVerita Schneiders ASSISTANT:  Dr. CAletha Halim INDICATIONS: LZoya Sprecheris a 41y.o.41y.o. [id number] 393w2dere for cesarean section and bilateral tubal sterilization secondary to the indications listed under preoperative diagnoses; please see preoperative note for further details.  The risks of surgery were discussed with the patient including but were not limited to: bleeding which may require transfusion or reoperation; infection which may require antibiotics; injury to bowel, bladder, ureters or other surrounding organs; injury to the fetus; need for additional procedures including hysterectomy in the event of a life-threatening hemorrhage; placental abnormalities wth subsequent pregnancies, incisional problems, thromboembolic phenomenon and other postoperative/anesthesia complications.  Patient also desires permanent sterilization.  Other reversible forms of contraception were discussed with patient; she declines all other modalities. Risks of procedure discussed with patient including but not limited to: risk of regret, permanence of method, bleeding, infection, injury to surrounding organs and need for additional procedures.  Failure risk of 1 % with increased risk of ectopic gestation if pregnancy occurs was also discussed with patient.  The patient concurred with the proposed plan, giving informed written consent for the procedures.    FINDINGS:  Viable female infant in cephalic presentation.  Apgars 8 and 9.  Clear amniotic fluid.  Intact placenta, three vessel cord.  Normal uterus, fallopian tubes and ovaries bilaterally. Fallopian tubes sterilized in the [facility]  method. Minimal intraperitoneal adhesive disease apart from omental adhesions to anterior abdominal wall.  The fascia rectus and peritoneum layers were all adherent to each other.    ANESTHESIA: Spinal INTRAVENOUS FLUIDS: 3100 ml ESTIMATED BLOOD LOSS: 800 ml URINE OUTPUT:  100 ml SPECIMENS: Placenta sent to L&D; fallopian tube segments sent to pathology COMPLICATIONS: None immediate  PROCEDURE IN DETAIL:  The patient preoperatively received intravenous antibiotics and had sequential compression devices applied to her lower extremities.   She was then taken to the operating room where spinal anesthesia was administered and was found to be adequate. She was then placed in a dorsal supine position with a leftward tilt, and prepped and draped in a sterile manner.  A foley catheter was placed into her bladder and attached to constant gravity.  After an adequate timeout was performed, a transverse skin incision was made with scalpel over her preexisting scar and carried through to the underlying layer of fascia. The fascia was incised in the midline, and this incision was extended bilaterally using the Mayo scissors.  Kocher clamps were applied to the superior aspect of the fascial incision and the underlying rectus muscles were dissected off bluntly and sharply. A similar process was carried out on the inferior aspect of the fascial incision. The rectus muscles were separated in the midline bluntly and the peritoneum was entered bluntly. Attention was turned to the lower uterine segment where a low transverse hysterotomy was made with a scalpel and extended bilaterally bluntly.  The infant was successfully delivered, the cord was clamped and cut after one minute, and the infant was handed over to awaiting neonatology team. Uterine massage was then administered, and the placenta delivered intact with a three-vessel cord. The uterus was then cleared of clots and debris.  The hysterotomy was closed with 0 Vicryl in  a running locked fashion, and an  imbricating layer was also placed with 0 Vicryl.  Figure-of-eight 0 Vicryl serosal stitches were placed to help with hemostasis.   The patient's left fallopian tube was then identified, and the Babcock clamp was then used to grasp the tube approximately 3 cm from the cornual region. Another clamp was placed about 3 cm distal to this initial clamp.  An opening was created sharply in an avascular portion of the intervening mesosalpinx. Two free ties of 0 Vicryl were passed through the opening and the proximal end of the tube was doubly ligated. Same process was carried out for the distal end. The 2 cm tube segment was then sharply excised between the sutures, and this was sent for pathology.  This entire process was repeated in an identical fashion to the left fallopian tube, allowing for bilateral tubal sterilization. Excellent hemostasis was noted.  The peritoneum and the rectus muscles were reapproximated using 0 Vicryl interrupted stitches. The fascia was then closed using 0 PDS in a running fashion.  The subcutaneous layer was irrigated, then reapproximated with 2-0 plain gut interrupted stitches, and 30 ml of 0.5% Marcaine was injected subcutaneously around the incision.  The skin was closed with a 4-0 Vicryl subcuticular stitch.  After the skin was closed, a PICO disposable negative pressure wound therapy device was placed over the incision.  The suction was activated at a pressure of 6mHg.  The adhesive was affixed well and there were no leaks noted. The patient tolerated the procedure well. Sponge, lap, instrument and needle counts were correct x 3.  She was taken to the recovery room in stable condition.    UVerita Schneiders MD, FSimsAttending [place]emb[place] [facility]

## 2017-02-15 ENCOUNTER — Encounter (HOSPITAL_COMMUNITY): Payer: Self-pay | Admitting: Obstetrics & Gynecology

## 2017-02-15 ENCOUNTER — Encounter (HOSPITAL_COMMUNITY): Payer: Self-pay

## 2017-02-15 LAB — CBC
HCT: 29.4 % — ABNORMAL LOW (ref 36.0–46.0)
HCT: 28.3 % — ABNORMAL LOW (ref 36.0–46.0)
Hemoglobin: 9.4 g/dL — ABNORMAL LOW (ref 12.0–15.0)
Hemoglobin: 9 g/dL — ABNORMAL LOW (ref 12.0–15.0)
MCH: 24.5 pg — AB (ref 26.0–34.0)
MCH: 24.6 pg — ABNORMAL LOW (ref 26.0–34.0)
MCHC: 32 g/dL (ref 30.0–36.0)
MCHC: 31.8 g/dL (ref 30.0–36.0)
MCV: 76.6 fL — ABNORMAL LOW (ref 78.0–100.0)
MCV: 77.3 fL — ABNORMAL LOW (ref 78.0–100.0)
PLATELETS: 286 10*3/uL (ref 150–400)
PLATELETS: 297 10*3/uL (ref 150–400)
RBC: 3.84 MIL/uL — AB (ref 3.87–5.11)
RBC: 3.66 MIL/uL — ABNORMAL LOW (ref 3.87–5.11)
RDW: 15.8 % — AB (ref 11.5–15.5)
RDW: 15.9 % — AB (ref 11.5–15.5)
WBC: 15.2 10*3/uL — AB (ref 4.0–10.5)
WBC: 14.1 10*3/uL — AB (ref 4.0–10.5)

## 2017-02-15 LAB — COMPREHENSIVE METABOLIC PANEL
ALT: 15 U/L (ref 14–54)
ANION GAP: 7 (ref 5–15)
AST: 32 U/L (ref 15–41)
Albumin: 2.7 g/dL — ABNORMAL LOW (ref 3.5–5.0)
Alkaline Phosphatase: 76 U/L (ref 38–126)
BILIRUBIN TOTAL: 0.4 mg/dL (ref 0.3–1.2)
BUN: 8 mg/dL (ref 6–20)
CHLORIDE: 108 mmol/L (ref 101–111)
CO2: 21 mmol/L — ABNORMAL LOW (ref 22–32)
Calcium: 8.9 mg/dL (ref 8.9–10.3)
Creatinine, Ser: 0.65 mg/dL (ref 0.44–1.00)
GFR calc non Af Amer: >60 mL/min (ref >60)
Glucose, Bld: 138 mg/dL — ABNORMAL HIGH (ref 65–99)
POTASSIUM: 3.3 mmol/L — AB (ref 3.5–5.1)
Sodium: 136 mmol/L (ref 135–145)
TOTAL PROTEIN: 5.9 g/dL — AB (ref 6.5–8.1)

## 2017-02-15 LAB — GLUCOSE, CAPILLARY
GLUCOSE-CAPILLARY: 110 mg/dL — AB (ref 65–99)
Glucose-Capillary: 89 mg/dL (ref 65–99)
Glucose-Capillary: 96 mg/dL (ref 65–99)

## 2017-02-15 MED ORDER — PNEUMOCOCCAL VAC POLYVALENT 25 MCG/0.5ML IJ INJ
0.5000 mL | INJECTION | Freq: Once | INTRAMUSCULAR | Status: DC
  Filled 2017-02-15: qty 0.5

## 2017-02-15 MED ORDER — HYDROCHLOROTHIAZIDE 25 MG PO TABS
25.0000 mg | ORAL_TABLET | Freq: Every day | ORAL | Status: DC
  Administered 2017-02-15 – 2017-02-17 (×3): 25 mg via ORAL
  Filled 2017-02-15 (×4): qty 1

## 2017-02-15 NOTE — Progress Notes (Signed)
Subjective: Postpartum Day 1: Cesarean Delivery Patient reports tolerating PO and + flatus.    Objective: Vital signs in last 24 hours: Temp:  [95.6 F (35.3 C)-98.2 F (36.8 C)] 98.2 F (36.8 C) (06/18 0010) Pulse Rate:  [55-86] 69 (06/17 1755) Resp:  [14-19] 18 (06/17 2200) BP: (116-142)/(62-87) 132/62 (06/17 1755) SpO2:  [94 %-100 %] 100 % (06/17 2200) Weight:  [286 lb (129.7 kg)] 286 lb (129.7 kg) (06/17 1003)  Physical Exam:  General: alert, cooperative and no distress Lochia: appropriate Uterine Fundus: firm Incision: healing well, no significant drainage, no dehiscence, no significant erythema DVT Evaluation: No evidence of DVT seen on physical exam.   Recent Labs  02/12/17 1005 02/15/17 0507  HGB 10.4* 9.4*  HCT 32.4* 29.4*    Assessment/Plan: Status post Cesarean section. Doing well postoperatively.  Continue current care.   Len Blalock SNM 02/15/2017, 6:00 AM

## 2017-02-15 NOTE — Lactation Note (Signed)
This note was copied from a baby's chart. Lactation Consultation Note  Patient Name: Teresa Perez [id number] Date: 02/15/2017 Reason for consult: Follow-up assessment  Arrived with baby latched to left breast in football hold. Mom reports feeding for about 30 minutes.  LC noted a few swallows and then baby released nipple and is fussy.  Mom has easily expressed white milk after feeding.  Mom attempted to burp baby then sleepy.  Mom attempted to relatch and baby just fell asleep on mom.   Nipple noted to be compressed on upper half of large evert nipple.  Mom denies pain with latching.  Mom report last feeding was his best feeding.   RN has concerns about mom being able to latch baby independently, mom to call for [facility] assist.     Maternal Data    Feeding Feeding Type: Breast Fed Length of feed:  (end of feeding observed with some nipple compression)  LATCH Score/Interventions Latch: Repeated attempts needed to sustain latch, nipple held in mouth throughout feeding, stimulation needed to elicit sucking reflex. Intervention(s): Adjust position;Assist with latch;Breast massage;Breast compression  Audible Swallowing: A few with stimulation Intervention(s): Skin to skin;Hand expression Intervention(s): Skin to skin;Hand expression  Type of Nipple: Everted at rest and after stimulation  Comfort (Breast/Nipple): Soft / non-tender     Hold (Positioning): Assistance needed to correctly position infant at breast and maintain latch. Intervention(s): Breastfeeding basics reviewed;Support Pillows;Position options;Skin to skin  LATCH Score: 7  Lactation Tools Discussed/Used WIC Program: Yes   Consult Status Consult Status: Follow-up Date: 02/16/17 Follow-up type: In-patient    Justice Britain 02/15/2017, 6:03 PM

## 2017-02-16 LAB — TYPE AND SCREEN
ABO/RH(D): AB POS
ANTIBODY SCREEN: NEGATIVE
UNIT DIVISION: 0
Unit division: 0

## 2017-02-16 LAB — BPAM RBC
Blood Product Expiration Date: 201807022359
Blood Product Expiration Date: 201807022359
UNIT TYPE AND RH: 5100
Unit Type and Rh: 5100

## 2017-02-16 LAB — BIRTH TISSUE RECOVERY COLLECTION (PLACENTA DONATION)

## 2017-02-16 MED ORDER — FERROUS SULFATE 325 (65 FE) MG PO TABS
325.0000 mg | ORAL_TABLET | Freq: Two times a day (BID) | ORAL | Status: DC
  Administered 2017-02-16 – 2017-02-17 (×4): 325 mg via ORAL
  Filled 2017-02-16 (×4): qty 1

## 2017-02-16 MED ORDER — AMLODIPINE BESYLATE 10 MG PO TABS
10.0000 mg | ORAL_TABLET | Freq: Every day | ORAL | Status: DC
  Administered 2017-02-17: 10 mg via ORAL
  Filled 2017-02-16 (×2): qty 1

## 2017-02-16 MED ORDER — AMLODIPINE BESYLATE 5 MG PO TABS
5.0000 mg | ORAL_TABLET | Freq: Every day | ORAL | Status: DC
  Administered 2017-02-16: 5 mg via ORAL
  Filled 2017-02-16 (×2): qty 1

## 2017-02-16 MED ORDER — AMLODIPINE BESYLATE 5 MG PO TABS
5.0000 mg | ORAL_TABLET | Freq: Once | ORAL | Status: AC
  Administered 2017-02-16: 5 mg via ORAL
  Filled 2017-02-16: qty 1

## 2017-02-16 NOTE — Progress Notes (Signed)
Subjective: Postpartum Day #2: Cesarean Delivery Patient reports incisional pain, tolerating PO, + flatus and no problems voiding.    Objective: Vital signs in last 24 hours: Temp:  [97.9 F (36.6 C)-99 F (37.2 C)] 99 F (37.2 C) (06/19 0617) Pulse Rate:  [63-75] 63 (06/19 0617) Resp:  [16-18] 18 (06/19 0617) BP: (119-158)/(62-82) 157/79 (06/19 0617) SpO2:  [97 %] 97 % (06/18 0830)  Physical Exam:  General: alert, cooperative and no distress Lochia: appropriate Uterine Fundus: firm Incision: no significant drainage, no dehiscence, no significant erythema, PICO dressing intact DVT Evaluation: No evidence of DVT seen on physical exam. No cords or calf tenderness. No significant calf/ankle edema.   Recent Labs  02/15/17 0507 02/15/17 2202  HGB 9.4* 9.0*  HCT 29.4* 28.3*    Assessment/Plan: Status post Cesarean section. Postoperative course complicated by elevated blood pressures: Norvasc 5 mg started.  Anemia:  Asymptomatic, on iron.  Continue current care. Anticipate discharge home 02/17/17.  Teresa Perez, CNM 02/16/2017, 7:48 AM

## 2017-02-16 NOTE — Progress Notes (Signed)
Rn called Dr. Vanetta Shawl regarding Blood pressures of 157/79.  Patient previously had high blood pressures.  Rn's assessment of patient was WNL no PIH signs or symptoms found.   Dr. Vanetta Shawl gave no further orders.

## 2017-02-16 NOTE — Progress Notes (Signed)
Rn called Dr Vanetta Shawl regarding patient's blood pressure of 148/79.  RN had called Dr. Vanetta Shawl previously regarding prior increased blood pressures . The above blood pressure was a follow up.   Again Rn's assessment of the patient had found no other signs of PIH.   Dr. Vanetta Shawl stated she would order [facility] labs and would like to obtain urine for a protein creatine if patient would consent. Patient stated she would not like to be in and out cath if possible.   Dr. Vanetta Shawl gave orders to take vital signs again at 23:00.

## 2017-02-16 NOTE — Progress Notes (Signed)
Dr Lorita Officer notified of patient's BP being continually high; orders given to give another 71m Norvasc tonight and start on 174mtomorrow AM. Also instructed to check BP every 8 hours.

## 2017-02-16 NOTE — Progress Notes (Addendum)
Rn called Dr Vanetta Shawl regarding Blood pressure of  157/77.  Patient stated that she had a headache with pain level of 2. Patient stated that her incisional pain was increasing and did not understand why. Rn gave patient motrin and perocet.   Rn's assessment of patient found no other signs of PIH except patient's slight headache. In addition, the patient states she did not wish to breast feed.  The Rn and the Dr. Reviewed the [facility] labs results which  were WNL. Orders were given to give pt another perocet if pain was high or tylenol 325 mg. Also to retake vital sign in am between 0500-600 am.  Patient was given another perocet. Also baby was taken to nursery so mom could sleep.

## 2017-02-16 NOTE — Progress Notes (Addendum)
Rn called Dr Vanetta Shawl regarding patient's blood pressure of 144/65 and previous blood pressure of 152/82.   The  Previous Rn  reported off the patient's blood pressure was 152/82. And the RN stated patient had a headache. Also at that time the patient stated she was upset when her other children were visiting and her pain had increased.   The night shift Rn took vital signs again at [place] after the  children had left and the patient's  blood pressure was 144/65 and no other signs of PIH were found at this point.   Orders were given to take the patient's blood pressure again at 2100.

## 2017-02-17 MED ORDER — ENALAPRIL MALEATE 10 MG PO TABS
10.0000 mg | ORAL_TABLET | Freq: Once | ORAL | Status: AC
  Administered 2017-02-17: 10 mg via ORAL
  Filled 2017-02-17: qty 1

## 2017-02-17 MED ORDER — LISINOPRIL 10 MG PO TABS: 10.0000 mg | ORAL_TABLET | Freq: Every day | ORAL | Status: DC

## 2017-02-17 MED ORDER — SERTRALINE HCL 100 MG PO TABS
100.0000 mg | ORAL_TABLET | Freq: Every day | ORAL | 3 refills | Status: DC
Start: 2017-02-18 — End: 2017-07-29

## 2017-02-17 MED ORDER — ENALAPRIL MALEATE 10 MG PO TABS
10.0000 mg | ORAL_TABLET | Freq: Every day | ORAL | Status: DC
  Administered 2017-02-17: 10 mg via ORAL
  Filled 2017-02-17 (×2): qty 1

## 2017-02-17 MED ORDER — FERROUS SULFATE 325 (65 FE) MG PO TABS: 325.0000 mg | ORAL_TABLET | Freq: Two times a day (BID) | ORAL | 3 refills | Status: DC

## 2017-02-17 MED ORDER — AMLODIPINE BESYLATE 10 MG PO TABS: 10.0000 mg | ORAL_TABLET | Freq: Every day | ORAL | 3 refills | Status: DC

## 2017-02-17 MED ORDER — HYDROCHLOROTHIAZIDE 25 MG PO TABS: 25.0000 mg | ORAL_TABLET | Freq: Every day | ORAL | 3 refills | Status: DC

## 2017-02-17 MED ORDER — OXYCODONE-ACETAMINOPHEN 5-325 MG PO TABS: 1.0000 | ORAL_TABLET | ORAL | 0 refills | Status: AC | PRN

## 2017-02-17 NOTE — Progress Notes (Signed)
Notified Dr. Burr Medico of pt's BP 155/83 1 hour after AM BP meds given. Pt c/o H/A 3/10. (Had percocet at 0926.) No other S&S of PIH. No new orders received. Oncoming RN made aware.

## 2017-02-17 NOTE — Discharge Instructions (Signed)
Take off the wound vacuum (PICO) in 1 week and throw in trash. Sometimes it falls off before, and that is ok. If it starts beeping, it likely means it has lost suction, and you can remove it.   You will have a visit with a baby love nurse in 2 days to check your blood pressure. Please continue taking your hydrochlorothiazide (HCTZ) and norvasc (amlodipine) as directed for your blood pressure.    Return to [facility] hospital for these signs of pre-eclampsia:  Severe headache that does not go away with Tylenol  Visual changes- seeing spots, double, blurred vision  Pain under your right breast or upper abdomen that does not go away with Tums or heartburn medicine  Nausea and/or vomiting  Severe swelling in your hands, feet, and face    Cesarean Delivery, Care After Refer to this sheet in the next few weeks. These instructions provide you with information about caring for yourself after your procedure. Your health care provider may also give you more specific instructions. Your treatment has been planned according to current medical practices, but problems sometimes occur. Call your health care provider if you have any problems or questions after your procedure. What can I expect after the procedure? After the procedure, it is common to have:  A small amount of blood or clear fluid coming from the incision.  Some redness, swelling, and pain in your incision area.  Some abdominal pain and soreness.  Vaginal bleeding (lochia).  Pelvic cramps.  Fatigue.  Follow these instructions at home: Incision care   Follow instructions from your health care provider about how to take care of your incision. Make sure you: ? Wash your hands with soap and water before you change your bandage (dressing). If soap and water are not available, use hand sanitizer. ? Change your dressing as told by your health care provider. ? Leave stitches (sutures), skin staples, skin glue, or adhesive strips in  place. These skin closures may need to stay in place for 2 weeks or longer. If adhesive strip edges start to loosen and curl up, you may trim the loose edges. Do not remove adhesive strips completely unless your health care provider tells you to do that.  Check your incision area every day for signs of infection. Check for: ? More redness, swelling, or pain. ? More fluid or blood. ? Warmth. ? Pus or a bad smell.  When you cough or sneeze, hug a pillow. This helps with pain and decreases the chance of your incision opening up (dehiscing). Do this until your incision heals. Medicines  Take over-the-counter and prescription medicines only as told by your health care provider.  If you were prescribed an antibiotic medicine, take it as told by your health care provider. Do not stop taking the antibiotic until it is finished. Driving  Do not drive or operate heavy machinery while taking prescription pain medicine.  Do not drive for 24 hours if you received a sedative. Lifestyle  Do not drink alcohol. This is especially important if you are breastfeeding or taking pain medicine.  Do not use tobacco products, including cigarettes, chewing tobacco, or e-cigarettes. If you need help quitting, ask your health care provider. Tobacco can delay wound healing. Eating and drinking  Drink at least 8 eight-ounce glasses of water every day unless told not to by your health care provider. If you breastfeed, you may need to drink more water than this.  Eat high-fiber foods every day. These foods may help prevent  or relieve constipation. High-fiber foods include: ? Whole grain cereals and breads. ? Brown rice. ? Beans. ? Fresh fruits and vegetables. Activity  Return to your normal activities as told by your health care provider. Ask your health care provider what activities are safe for you.  Rest as much as possible. Try to rest or take a nap while your baby is sleeping.  Do not lift anything that  is heavier than your baby or 10 lb (4.5 kg) as told by your health care provider.  Ask your health care provider when you can engage in sexual activity. This may depend on your: ? Risk of infection. ? Healing rate. ? Comfort and desire to engage in sexual activity. Bathing  Do not take baths, swim, or use a hot tub until your health care provider approves. Ask your health care provider if you can take showers. You may only be allowed to take sponge baths until your incision heals.  Keep your dressing dry as told by your health care provider. General instructions  Do not use tampons or douches until your health care provider approves.  Wear: ? Loose, comfortable clothing. ? A supportive and well-fitting bra.  Watch for any blood clots that may pass from your vagina. These may look like clumps of dark red, brown, or black discharge.  Keep your perineum clean and dry as told by your health care provider.  Wipe from front to back when you use the toilet.  If possible, have someone help you care for your baby and help with household activities for a few days after you leave the hospital.  Keep all follow-up visits for you and your baby as told by your health care provider. This is important. Contact a health care provider if:  You have: ? Bad-smelling vaginal discharge. ? Difficulty urinating. ? Pain when urinating. ? A sudden increase or decrease in the frequency of your bowel movements. ? More redness, swelling, or pain around your incision. ? More fluid or blood coming from your incision. ? Pus or a bad smell coming from your incision. ? A fever. ? A rash. ? Little or no interest in activities you used to enjoy. ? Questions about caring for yourself or your baby. ? Nausea.  Your incision feels warm to the touch.  Your breasts turn red or become painful or hard.  You feel unusually sad or worried.  You vomit.  You pass large blood clots from your vagina. If you pass a  blood clot, save it to show to your health care provider. Do not flush blood clots down the toilet without showing your health care provider.  You urinate more than usual.  You are dizzy or light-headed.  You have not breastfed and have not had a menstrual period for 12 weeks after delivery.  You stopped breastfeeding and have not had a menstrual period for 12 weeks after stopping breastfeeding. Get help right away if:  You have: ? Pain that does not go away or get better with medicine. ? Chest pain. ? Difficulty breathing. ? Blurred vision or spots in your vision. ? Thoughts about hurting yourself or your baby. ? New pain in your abdomen or in one of your legs. ? A severe headache.  You faint.  You bleed from your vagina so much that you fill two sanitary pads in one hour. This information is not intended to replace advice given to you by your health care provider. Make sure you discuss any questions you  have with your health care provider. Document Released: 05/09/2002 Document Revised: 12/26/2015 Document Reviewed: 07/22/2015 Elsevier Interactive Patient Education  2017 [employer].   Breastfeeding Deciding to breastfeed is one of the best choices you can make for you and your baby. A change in hormones during pregnancy causes your breast tissue to grow and increases the number and size of your milk ducts. These hormones also allow proteins, sugars, and fats from your blood supply to make breast milk in your milk-producing glands. Hormones prevent breast milk from being released before your baby is born as well as prompt milk flow after birth. Once breastfeeding has begun, thoughts of your baby, as well as his or her sucking or crying, can stimulate the release of milk from your milk-producing glands. Benefits of breastfeeding For Your Baby  Your first milk (colostrum) helps your baby's digestive system function better.  There are antibodies in your milk that help your baby  fight off infections.  Your baby has a lower incidence of asthma, allergies, and sudden infant death syndrome.  The nutrients in breast milk are better for your baby than infant formulas and are designed uniquely for your babys needs.  Breast milk improves your baby's brain development.  Your baby is less likely to develop other conditions, such as childhood obesity, asthma, or type 2 diabetes mellitus.  For You  Breastfeeding helps to create a very special bond between you and your baby.  Breastfeeding is convenient. Breast milk is always available at the correct temperature and costs nothing.  Breastfeeding helps to burn calories and helps you lose the weight gained during pregnancy.  Breastfeeding makes your uterus contract to its prepregnancy size faster and slows bleeding (lochia) after you give birth.  Breastfeeding helps to lower your risk of developing type 2 diabetes mellitus, osteoporosis, and breast or ovarian cancer later in life.  Signs that your baby is hungry Early Signs of Hunger  Increased alertness or activity.  Stretching.  Movement of the head from side to side.  Movement of the head and opening of the mouth when the corner of the mouth or cheek is stroked (rooting).  Increased sucking sounds, smacking lips, cooing, sighing, or squeaking.  Hand-to-mouth movements.  Increased sucking of fingers or hands.  Late Signs of Hunger  Fussing.  Intermittent crying.  Extreme Signs of Hunger Signs of extreme hunger will require calming and consoling before your baby will be able to breastfeed successfully. Do not wait for the following signs of extreme hunger to occur before you initiate breastfeeding:  Restlessness.  A loud, strong cry.  Screaming.  Breastfeeding basics Breastfeeding Initiation  Find a comfortable place to sit or lie down, with your neck and back well supported.  Place a pillow or rolled up blanket under your baby to bring him or  her to the level of your breast (if you are seated). Nursing pillows are specially designed to help support your arms and your baby while you breastfeed.  Make sure that your baby's abdomen is facing your abdomen.  Gently massage your breast. With your fingertips, massage from your chest wall toward your nipple in a circular motion. This encourages milk flow. You may need to continue this action during the feeding if your milk flows slowly.  Support your breast with 4 fingers underneath and your thumb above your nipple. Make sure your fingers are well away from your nipple and your babys mouth.  Stroke your baby's lips gently with your finger or nipple.  When your baby's mouth is open wide enough, quickly bring your baby to your breast, placing your entire nipple and as much of the colored area around your nipple (areola) as possible into your baby's mouth. ? More areola should be visible above your baby's upper lip than below the lower lip. ? Your baby's tongue should be between his or her lower gum and your breast.  Ensure that your baby's mouth is correctly positioned around your nipple (latched). Your baby's lips should create a seal on your breast and be turned out (everted).  It is common for your baby to suck about 2-3 minutes in order to start the flow of breast milk.  Latching Teaching your baby how to latch on to your breast properly is very important. An improper latch can cause nipple pain and decreased milk supply for you and poor weight gain in your baby. Also, if your baby is not latched onto your nipple properly, he or she may swallow some air during feeding. This can make your baby fussy. Burping your baby when you switch breasts during the feeding can help to get rid of the air. However, teaching your baby to latch on properly is still the best way to prevent fussiness from swallowing air while breastfeeding. Signs that your baby has successfully latched on to your  nipple:  Silent tugging or silent sucking, without causing you pain.  Swallowing heard between every 3-4 sucks.  Muscle movement above and in front of his or her ears while sucking.  Signs that your baby has not successfully latched on to nipple:  Sucking sounds or smacking sounds from your baby while breastfeeding.  Nipple pain.  If you think your baby has not latched on correctly, slip your finger into the corner of your babys mouth to break the suction and place it between your baby's gums. Attempt breastfeeding initiation again. Signs of Successful Breastfeeding Signs from your baby:  A gradual decrease in the number of sucks or complete cessation of sucking.  Falling asleep.  Relaxation of his or her body.  Retention of a small amount of milk in his or her mouth.  Letting go of your breast by himself or herself.  Signs from you:  Breasts that have increased in firmness, weight, and size 1-3 hours after feeding.  Breasts that are softer immediately after breastfeeding.  Increased milk volume, as well as a change in milk consistency and color by the fifth day of breastfeeding.  Nipples that are not sore, cracked, or bleeding.  Signs That Your [employer] is Getting Enough Milk  Wetting at least 1-2 diapers during the first 24 hours after birth.  Wetting at least 5-6 diapers every 24 hours for the first week after birth. The urine should be clear or pale yellow by 5 days after birth.  Wetting 6-8 diapers every 24 hours as your baby continues to grow and develop.  At least 3 stools in a 24-hour period by age 80 days. The stool should be soft and yellow.  At least 3 stools in a 24-hour period by age 1 days. The stool should be seedy and yellow.  No loss of weight greater than 10% of birth weight during the first 66 days of age.  Average weight gain of 4-7 ounces (113-198 g) per week after age 78 days.  Consistent daily weight gain by age 36 days, without weight loss after  the age of 2 weeks.  After a feeding, your baby may spit up a small  amount. This is common. Breastfeeding frequency and duration Frequent feeding will help you make more milk and can prevent sore nipples and breast engorgement. Breastfeed when you feel the need to reduce the fullness of your breasts or when your baby shows signs of hunger. This is called "breastfeeding on demand." Avoid introducing a pacifier to your baby while you are working to establish breastfeeding (the first 4-6 weeks after your baby is born). After this time you may choose to use a pacifier. Research has shown that pacifier use during the first year of a baby's life decreases the risk of sudden infant death syndrome (SIDS). Allow your baby to feed on each breast as long as he or she wants. Breastfeed until your baby is finished feeding. When your baby unlatches or falls asleep while feeding from the first breast, offer the second breast. Because newborns are often sleepy in the first few weeks of life, you may need to awaken your baby to get him or her to feed. Breastfeeding times will vary from baby to baby. However, the following rules can serve as a guide to help you ensure that your baby is properly fed:  Newborns (babies 20 weeks of age or younger) may breastfeed every 1-3 hours.  Newborns should not go longer than 3 hours during the day or 5 hours during the night without breastfeeding.  You should breastfeed your baby a minimum of 8 times in a 24-hour period until you begin to introduce solid foods to your baby at around 20 months of age.  Breast milk pumping Pumping and storing breast milk allows you to ensure that your baby is exclusively fed your breast milk, even at times when you are unable to breastfeed. This is especially important if you are going back to work while you are still breastfeeding or when you are not able to be present during feedings. Your lactation consultant can give you guidelines on how long it is  safe to store breast milk. A breast pump is a machine that allows you to pump milk from your breast into a sterile bottle. The pumped breast milk can then be stored in a refrigerator or freezer. Some breast pumps are operated by hand, while others use electricity. Ask your lactation consultant which type will work best for you. Breast pumps can be purchased, but some hospitals and breastfeeding support groups lease breast pumps on a monthly basis. A lactation consultant can teach you how to hand express breast milk, if you prefer not to use a pump. Caring for your breasts while you breastfeed Nipples can become dry, cracked, and sore while breastfeeding. The following recommendations can help keep your breasts moisturized and healthy:  Avoid using soap on your nipples.  Wear a supportive bra. Although not required, special nursing bras and tank tops are designed to allow access to your breasts for breastfeeding without taking off your entire bra or top. Avoid wearing underwire-style bras or extremely tight bras.  Air dry your nipples for 3-48mnutes after each feeding.  Use only cotton bra pads to absorb leaked breast milk. Leaking of breast milk between feedings is normal.  Use lanolin on your nipples after breastfeeding. Lanolin helps to maintain your skin's normal moisture barrier. If you use pure lanolin, you do not need to wash it off before feeding your baby again. Pure lanolin is not toxic to your baby. You may also hand express a few drops of breast milk and gently massage that milk into your nipples and allow  the milk to air dry.  In the first few weeks after giving birth, some women experience extremely full breasts (engorgement). Engorgement can make your breasts feel heavy, warm, and tender to the touch. Engorgement peaks within 3-5 days after you give birth. The following recommendations can help ease engorgement:  Completely empty your breasts while breastfeeding or pumping. You may  want to start by applying warm, moist heat (in the shower or with warm water-soaked hand towels) just before feeding or pumping. This increases circulation and helps the milk flow. If your baby does not completely empty your breasts while breastfeeding, pump any extra milk after he or she is finished.  Wear a snug bra (nursing or regular) or tank top for 1-2 days to signal your body to slightly decrease milk production.  Apply ice packs to your breasts, unless this is too uncomfortable for you.  Make sure that your baby is latched on and positioned properly while breastfeeding.  If engorgement persists after 48 hours of following these recommendations, contact your health care provider or a Science writer. Overall health care recommendations while breastfeeding  Eat healthy foods. Alternate between meals and snacks, eating 3 of each per day. Because what you eat affects your breast milk, some of the foods may make your baby more irritable than usual. Avoid eating these foods if you are sure that they are negatively affecting your baby.  Drink milk, fruit juice, and water to satisfy your thirst (about 10 glasses a day).  Rest often, relax, and continue to take your prenatal vitamins to prevent fatigue, stress, and anemia.  Continue breast self-awareness checks.  Avoid chewing and smoking tobacco. Chemicals from cigarettes that pass into breast milk and exposure to secondhand smoke may harm your baby.  Avoid alcohol and drug use, including marijuana. Some medicines that may be harmful to your baby can pass through breast milk. It is important to ask your health care provider before taking any medicine, including all over-the-counter and prescription medicine as well as vitamin and herbal supplements. It is possible to become pregnant while breastfeeding. If birth control is desired, ask your health care provider about options that will be safe for your baby. Contact a health care provider  if:  You feel like you want to stop breastfeeding or have become frustrated with breastfeeding.  You have painful breasts or nipples.  Your nipples are cracked or bleeding.  Your breasts are red, tender, or warm.  You have a swollen area on either breast.  You have a fever or chills.  You have nausea or vomiting.  You have drainage other than breast milk from your nipples.  Your breasts do not become full before feedings by the fifth day after you give birth.  You feel sad and depressed.  Your baby is too sleepy to eat well.  Your baby is having trouble sleeping.  Your baby is wetting less than 3 diapers in a 24-hour period.  Your baby has less than 3 stools in a 24-hour period.  Your baby's skin or the white part of his or her eyes becomes yellow.  Your baby is not gaining weight by 83 days of age. Get help right away if:  Your baby is overly tired (lethargic) and does not want to wake up and feed.  Your baby develops an unexplained fever. This information is not intended to replace advice given to you by your health care provider. Make sure you discuss any questions you have with your health care  provider. Document Released: 08/17/2005 Document Revised: 01/29/2016 Document Reviewed: 02/08/2013 Elsevier Interactive Patient Education  2017 [employer].

## 2017-02-17 NOTE — Progress Notes (Signed)
Received call back from Dr. Burr Medico. Pt to be given her motrin now for headache. RN will call if further medication needed for headache. BP to be taken again at 1330. Oncoming RN notified.

## 2017-02-17 NOTE — Discharge Summary (Signed)
OB Discharge Summary     Patient Name: Teresa Perez DOB: [birth year] MRN: [id number]  Date of admission: 02/14/2017 Delivering MD: Verita Schneiders A   Date of discharge: 02/17/2017  Admitting diagnosis: No admission diagnoses are documented for this encounter. Intrauterine pregnancy: 103w2d    Secondary diagnosis:  Principal Problem:   Previous cesarean section x 4 Active Problems:   Gestational diabetes mellitus (GDM), antepartum   History of postpartum depression   S/P cesarean section  Additional problems: PPHTN     Discharge diagnosis: GDM A2, Anemia and PPHTN                                                                                                Post partum procedures:none  Augmentation: RLTCS  Complications: None  Hospital course:  Sceduled C/S   41y.o. yo [id number] 380w2das admitted to the hospital 02/14/2017 for scheduled cesarean section with the following indication:Elective Repeat.  Membrane Rupture Time/Date: 12:14 PM ,02/14/2017   Patient delivered a Viable infant.02/14/2017  Details of operation can be found in separate operative note.  Pateint had an uncomplicated postpartum course.  She is ambulating, tolerating a regular diet, passing flatus, and urinating well. Patient is discharged home in stable condition on  02/17/17         Physical exam  Vitals:   02/16/17 1849 02/16/17 1906 02/16/17 2232 02/17/17 0545  BP: (!) 175/88 (!) 156/83 (!) 148/83 (!) 146/79  Pulse: 69 66 68 65  Resp: 18     Temp: 98 F (36.7 C)     TempSrc: Oral     SpO2:      Weight:      Height:       General: alert Lochia: appropriate Uterine Fundus: firm Incision: Healing well with no significant drainage, No significant erythema, Dressing is clean, dry, and intact DVT Evaluation: No evidence of DVT seen on physical exam. Labs: Lab Results  Component Value Date   WBC 14.1 (H) 02/15/2017   HGB 9.0 (L) 02/15/2017   HCT 28.3 (L) 02/15/2017   MCV 77.3 (L) 02/15/2017   PLT 297 02/15/2017   CMP Latest Ref Rng & Units 02/15/2017  Glucose 65 - 99 mg/dL 138(H)  BUN 6 - 20 mg/dL 8  Creatinine 0.44 - 1.00 mg/dL 0.65  Sodium 135 - 145 mmol/L 136  Potassium 3.5 - 5.1 mmol/L 3.3(L)  Chloride 101 - 111 mmol/L 108  CO2 22 - 32 mmol/L 21(L)  Calcium 8.9 - 10.3 mg/dL 8.9  Total Protein 6.5 - 8.1 g/dL 5.9(L)  Total Bilirubin 0.3 - 1.2 mg/dL 0.4  Alkaline Phos 38 - 126 U/L 76  AST 15 - 41 U/L 32  ALT 14 - 54 U/L 15    Discharge instruction: per After Visit Summary and "Baby and Me Booklet".  After visit meds:  Allergies as of 02/17/2017      Reactions   Latex Rash, Itching      Medication List    STOP taking these medications   glyBURIDE 5 MG tablet Commonly known as:  DIABETA  TAKE these medications   albuterol 108 (90 Base) MCG/ACT inhaler Commonly known as:  PROVENTIL HFA;VENTOLIN HFA Inhale 2 puffs into the lungs every 6 (six) hours as needed for wheezing or shortness of breath.   amLODipine 10 MG tablet Commonly known as:  NORVASC Take 1 tablet (10 mg total) by mouth daily.   ferrous sulfate 325 (65 FE) MG tablet Take 1 tablet (325 mg total) by mouth 2 (two) times daily with a meal.   hydrochlorothiazide 25 MG tablet Commonly known as:  HYDRODIURIL Take 1 tablet (25 mg total) by mouth daily.   oxyCODONE-acetaminophen 5-325 MG tablet Commonly known as:  PERCOCET/ROXICET Take 1 tablet by mouth every 4 (four) hours as needed (pain scale 4-7).   PREPLUS 27-1 MG Tabs Take 1 tablet by mouth daily.       Diet: routine diet  Activity: Advance as tolerated. Pelvic rest for 6 weeks.   Outpatient follow up: 1 week for BP check  Follow up Appt: Future Appointments Date Time Provider [place]  03/04/2017 9:45 AM Lavonia Drafts, MD [facility] None   Follow up Visit:No Follow-up on file.  Postpartum contraception: Tubal Ligation  Newborn Data: Live born female  Birth Weight: 8 lb 4.1 oz (3745 g) APGAR: 8, 9  Baby  Feeding: Breast Disposition:home with mother   02/17/2017 Jonelle Sports, MD   I spoke with and examined patient and agree with resident/PA/SNM's note and plan of care.  POD#3 RLTCS & BTL, doing well and ready to go home. Developed PPHTN, is on hctz 41m and norvasc 121m has not received am dose. RN to recheck bp after meds, if normal pt to be d/c'd, w/ rx's for both. Ordered baby love visit for 2d from now for bp check. Gave printed info on pre-e s/s, reasons to return.  Also rx zoloft 10015maily that she was started on pp for h/o PPD. A2DM- will need 2hr postpartum GTT. Has PICO wound vac, to remove in 1wk if still on. Breastfeeding, plans outpatient circ.  KimRoma SchanzNM, WHNP-BC 02/17/2017 9:30 AM

## 2017-02-17 NOTE — Progress Notes (Signed)
Patient blood pressure maintaining in the 180's/80-90's, most recent blood pressure was 155/87, pt also c/o of no relief from headache. Dr. Ernestina Patches made aware, ordered to give patient a percocet to assist with headache pain and she will follow up with patient.Will continue to monitor patient.

## 2017-02-17 NOTE — Lactation Note (Signed)
This note was copied from a baby's chart. Lactation Consultation Note  Patient Name: Teresa Perez OVFIE'P Date: 02/17/2017 Reason for consult: Follow-up assessment  Baby 15 hours old. Mom reports that she decided not to BF any longer because she was dreading pumping when she returns to work and she just doesn't really want to BF. Mom states that her breast are filling and she usually does experience engorgement with each of her children. Provided anticipatory guidance and enc using cabbage leaves. Mom aware of [facility] phone assistance after D/C.   Maternal Data    Feeding    LATCH Score/Interventions                      Lactation Tools Discussed/Used     Consult Status Consult Status: Complete    Andres Labrum 02/17/2017, 9:31 AM

## 2017-02-23 ENCOUNTER — Telehealth: Payer: Self-pay | Admitting: Obstetrics and Gynecology

## 2017-02-25 ENCOUNTER — Ambulatory Visit: Payer: BLUE CROSS/BLUE SHIELD | Admitting: Family Medicine

## 2017-02-25 NOTE — Telephone Encounter (Signed)
Not able to complete

## 2017-02-26 ENCOUNTER — Ambulatory Visit: Payer: BLUE CROSS/BLUE SHIELD | Admitting: Family Medicine

## 2017-02-26 ENCOUNTER — Encounter: Payer: Self-pay | Admitting: Family Medicine

## 2017-02-26 VITALS — BP 156/94 | HR 78 | Wt 257.0 lb

## 2017-02-26 DIAGNOSIS — Z5189 Encounter for other specified aftercare: Secondary | ICD-10-CM

## 2017-02-26 DIAGNOSIS — O24415 Gestational diabetes mellitus in pregnancy, controlled by oral hypoglycemic drugs: Secondary | ICD-10-CM

## 2017-02-26 DIAGNOSIS — O165 Unspecified maternal hypertension, complicating the puerperium: Secondary | ICD-10-CM

## 2017-02-26 MED ORDER — ADULT BLOOD PRESSURE CUFF LG KIT: 1.0000 | PACK | 0 refills | Status: DC | PRN

## 2017-02-26 NOTE — Progress Notes (Signed)
   Subjective:    Patient ID: Teresa Perez, female    DOB: 1976-03-05, 41 y.o.   MRN: [id number]  HPI Patient seen for wound check and blood pressure check. Patient is postop day 12 from a repeat cesarean section. Postpartum course was fairly uncomplicated, although the patient was started on blood pressure medication due to elevated blood pressures. She has not taken her amlodipine or her hydrochlorothiazide in 2 days. A wound VAC has come off and she is without problems. She describes minimal pain and has been ambulating without difficulty. She is no longer taking her blood sugar medication.   Review of Systems    BP (!) 156/94   Pulse 78   Wt 257 lb (116.6 kg)   LMP 05/15/2016 (Approximate)   BMI 41.48 kg/m    Objective:   Physical Exam  Constitutional: She appears well-developed and well-nourished.  Cardiovascular: Normal rate and regular rhythm.   Pulmonary/Chest: Effort normal and breath sounds normal.  Abdominal: Soft. She exhibits no distension. There is no tenderness. There is no rebound and no guarding.  Incision clean, dry, intact. No drainage. Appears well-healed.      Assessment & Plan:  1. Visit for wound check Well-healed. Continue wound management  2. Postpartum hypertension Encourage patient to take amlodipine. I do not think she needs to be on hydrochlorothiazide at this point. Follow-up in 2-3 weeks for postpartum visit and blood pressure check  3. Gestational diabetes mellitus (GDM) controlled on oral hypoglycemic drug, antepartum Will need two hour gtt. postpartum.

## 2017-03-04 ENCOUNTER — Ambulatory Visit: Payer: BLUE CROSS/BLUE SHIELD | Admitting: Obstetrics & Gynecology

## 2017-03-26 ENCOUNTER — Ambulatory Visit (INDEPENDENT_AMBULATORY_CARE_PROVIDER_SITE_OTHER): Payer: BLUE CROSS/BLUE SHIELD | Admitting: Family Medicine

## 2017-03-26 ENCOUNTER — Encounter: Payer: Self-pay | Admitting: Family Medicine

## 2017-03-26 DIAGNOSIS — O165 Unspecified maternal hypertension, complicating the puerperium: Secondary | ICD-10-CM

## 2017-03-26 DIAGNOSIS — M9903 Segmental and somatic dysfunction of lumbar region: Secondary | ICD-10-CM

## 2017-03-26 DIAGNOSIS — M545 Low back pain, unspecified: Secondary | ICD-10-CM

## 2017-03-26 DIAGNOSIS — M9904 Segmental and somatic dysfunction of sacral region: Secondary | ICD-10-CM

## 2017-03-26 DIAGNOSIS — M9905 Segmental and somatic dysfunction of pelvic region: Secondary | ICD-10-CM

## 2017-03-26 DIAGNOSIS — O24415 Gestational diabetes mellitus in pregnancy, controlled by oral hypoglycemic drugs: Secondary | ICD-10-CM

## 2017-03-26 NOTE — Progress Notes (Signed)
Post Partum Exam  Teresa Perez is a 41 y.o. [id number] female who presents for a postpartum visit. She is 6 weeks postpartum following a low cervical transverse Cesarean section. I have fully reviewed the prenatal and intrapartum course. The delivery was at 39.2 gestational weeks.  Anesthesia: spinal. Postpartum course has been uncomplicated. Baby's course has been uncomplicated. Baby is feeding by bottle - Similac Advance. Bleeding moderate lochia. Bowel function is normal. Bladder function is normal. Patient is sexually active. Contraception method is tubal ligation. Postpartum depression screening: score 8  Does have some left sided back pain that started a couple weeks ago. Worse with movement or sitting for long periods.  The following portions of the patient's history were reviewed and updated as appropriate: allergies, current medications, past family history, past medical history, past social history, past surgical history and problem list.  Review of Systems Pertinent items are noted in HPI.    Objective:  Blood pressure (!) 142/79, pulse 67, height 5' 6.5" (1.689 m), weight 255 lb (115.7 kg), unknown if currently breastfeeding.  General:  alert, cooperative and no distress  Lungs: clear to auscultation bilaterally  Heart:  regular rate and rhythm, S1, S2 normal, no murmur, click, rub or gallop  Abdomen: soft, non-tender; bowel sounds normal; no masses,  no organomegaly. Incision clean, dry, intact   MSK: Restriction, tenderness, tissue texture changes, and paraspinal spasm in the left lumbar spine  Neuro: Moves all four extremities with no focal neurological deficit   OSE: Head   Cervical   Thoracic   Rib   Lumbar L5 ESRL  Sacrum L/L  Pelvis Right ant innom           Assessment:    Normal postpartum exam.  Plan:   1. Postpartum care and examination Contraception: BTL  2. Postpartum hypertension Patient to establish care with family medicine  3. Gestational diabetes  mellitus (GDM) controlled on oral hypoglycemic drug, antepartum Offer 2hr GTT - pt would like to go to family medicine. HgA1c in 3 month would be sufficient screening for DM2.  4. Acute left-sided low back pain without sciatica 5. Somatic dysfunction of lumbar region 6. Somatic dysfunction of sacral region 7. Somatic dysfunction of pelvic region OMT done after patient permission. HVLA technique utilized. Patient tolerated procedure well.

## 2017-04-01 ENCOUNTER — Encounter: Payer: Self-pay | Admitting: Family Medicine

## 2017-07-12 DIAGNOSIS — L409 Psoriasis, unspecified: Secondary | ICD-10-CM | POA: Diagnosis not present

## 2017-07-12 DIAGNOSIS — L810 Postinflammatory hyperpigmentation: Secondary | ICD-10-CM | POA: Diagnosis not present

## 2017-07-28 NOTE — Progress Notes (Addendum)
[facility] at [facility] [address], [place], [place] [address] [phone]  Date:  07/29/2017   Name:  Teresa Perez   DOB:  [birth year]   MRN:  [id number]  PCP:  Darreld Mclean, MD    Chief Complaint: Establish Care (Pt here to est care. Would like to discuss medication options for anxiety and depression. Pt states that she has tried Brewing technologist and zoloft in the past, and one other medicaiton but is unsure of the name of that medicaiton. )   History of Present Illness:  Teresa Perez is a 41 y.o. very pleasant female patient who presents with the following:  Here today as a new patient- Teresa Perez (rhymes with Media).  She would like to establish with PCP History of GDM, depression, obesity It looks like she just had her 6th baby over the summer, Dr. Nehemiah Settle is her GYN She now has a tubal ligation She did see Einar Pheasant at this office about 18 months ago   Her children are 22,18, 10, 6, 4 and her new baby The oldest are in college.    She would like to recheck her A1c today She has been toubled with depression and anxiety for a long time.  She is feeling more overwhelmed again, is crying more easily.   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 The cymbalta did seem like it was working pretty well for her and she would like to try this again   She is not sleeping well.  Her baby is sleeping well, this is not really the issue.  She tends to stay up too late watching TV.  She also does smoke cigs on occasion.   Her appetite is "like a wolf," she does not have any problems with this except for eating too much She feels like she is happy in her  life and with her family,but she is overwhelmed some of the time. Anxiety is more of her problem than depression.    No SI, but she is afraid that this could happen to her  She is fasting today except for coffee   She did have post- partum HTN twice in the past but is no longer taking any medication for his  Lab Results  Component Value Date   HGBA1C 5.7 (H) 08/05/2016     Patient Active Problem List   Diagnosis Date Noted  . Gestational diabetes mellitus (GDM), antepartum 08/17/2016  . History of conization of cervix affecting pregnancy, antepartum 08/05/2016  . Depression 01/02/2014  . PCOS (polycystic ovarian syndrome) 01/02/2014  . Severe obesity (BMI >= 40) ([place]) 06/22/2013    Past Medical History:  Diagnosis Date  . Acute bacterial bronchitis 11/05/2015  . Anemia   . Anxiety   . Asthma    childhood asthma  . Boil    tendency to form boils  . Depression    has been treated in past-no present meds  . Family history of anesthesia complication 3546   son- age 84 - "had lockjaw" after surgery (T&A)  . Gestational diabetes mellitus (GDM), antepartum 08/17/2016  Fasting on 2 hour was 107  . Headache(784.0)   . Menorrhagia 10/29/2015  . PCOS (polycystic ovarian syndrome)   . Pneumonia    AS A CHILD    Past Surgical History:  Procedure Laterality Date  . CERVICAL CONIZATION W/BX N/A 10/31/2015   Procedure: CONIZATION CERVIX WITH BIOPSY;  Surgeon: Eldred Manges, MD;  Location: [facility];  Service: Gynecology;  Laterality: N/A;  . CESAREAN SECTION     x2 previous  . CESAREAN SECTION  07/03/2011   Procedure: CESAREAN SECTION;  Surgeon: Alwyn Pea, MD;  Location: [facility];  Service: Gynecology;  Laterality: N/A;  . CESAREAN SECTION N/A 06/22/2013   Procedure: CESAREAN SECTION, repeat;  Surgeon: Eldred Manges, MD;  Location: [facility];  Service: Obstetrics;  Laterality: N/A;  . CESAREAN SECTION WITH BILATERAL TUBAL LIGATION Bilateral 02/14/2017   Procedure: CESAREAN  SECTION WITH BILATERAL TUBAL LIGATION;  Surgeon: Osborne Oman, MD;  Location: [facility];  Service: Obstetrics;  Laterality: Bilateral;  . IUD REMOVAL N/A 10/31/2015   Procedure: INTRAUTERINE DEVICE (IUD) REMOVAL;  Surgeon: Eldred Manges, MD;  Location: [facility];  Service: Gynecology;  Laterality: N/A;  . MOUTH SURGERY  2004    Social History   Tobacco Use  . Smoking status: Current Some Day Smoker    Packs/day: 0.25    Years: 16.00    Pack years: 4.00    Types: Cigarettes  . Smokeless tobacco: Never Used  Substance Use Topics  . Alcohol use: No    Comment: occasional   . Drug use: No    Family History  Problem Relation Age of Onset  . Anesthesia problems Son        son-had T&A-2008-had trismus  . Diabetes Mother   . Hypertension Mother   . Hyperlipidemia Mother   . Arthritis Mother   . Anxiety disorder Mother   . Hypertension Father   . Hypertension Paternal Grandmother   . Hyperlipidemia Paternal Grandmother   . Depression Neg Hx     Allergies  Allergen Reactions  . Latex Rash and Itching    Medication list has been reviewed and updated.  Current Outpatient Medications on File Prior to Visit  Medication Sig Dispense Refill  . clobetasol ointment (TEMOVATE) 0.05 % Apply to lesion on leg 2 x a day for 1 week.Cover with plastic wrap.Then use daily only as needed.Do not apply to face or groin.     No current facility-administered medications on file prior to visit.     Review of Systems:  As per HPI- otherwise negative. No fever or chills No CP or SOB  Physical Examination: Vitals:   07/29/17 1037 07/29/17 1116  BP: (!) 150/98 (!) 142/92  Pulse: 68   Temp: 98.4 F (36.9 C)   SpO2: 99%    Vitals:   07/29/17 1037  Weight: 268 lb (121.6 kg)  Height: 5' 6"  (1.676 m)   Body mass index is 43.26 kg/m. Ideal Body Weight: Weight in (lb) to have BMI = 25: 154.6  GEN: WDWN, NAD, Non-toxic, A & O x 3, obese, looks well.  Slightly tearful during  exam today HEENT: Atraumatic, Normocephalic. Neck supple. No masses, No LAD. Ears and Nose: No external deformity. CV: RRR, No M/G/R. No JVD. No thrill. No extra heart sounds. PULM: CTA B, no wheezes, crackles, rhonchi. No retractions. No resp. distress. No accessory muscle use. ABD: S, NT, ND, +BS. No rebound. No HSM. EXTR: No c/c/e NEURO Normal gait.  PSYCH:  Normally interactive. Conversant. Not depressed or anxious appearing.  Calm demeanor.    Assessment and Plan: Adjustment reaction with anxiety and depression - Plan: DULoxetine (CYMBALTA) 30 MG capsule, DISCONTINUED: DULoxetine (CYMBALTA) 60 MG capsule  History of gestational diabetes - Plan: Comprehensive metabolic panel, Hemoglobin A1c  Fatigue, unspecified type - Plan: CBC, TSH  Screening for hyperlipidemia - Plan: Lipid panel  Need for immunization against influenza - Plan: Flu Vaccine QUAD 6+ mos PF IM (Fluarix Quad PF)  Borderline blood pressure  Here today to establish care with me Flu shot today Labs pending  Will start her on cymbalta 30 BID- she will let me know how this is working for her over [place] and then see me in 2 months. Continue to monitor her BP- it is borderline and she may need to go back on medication for control  Signed Lamar Blinks, MD  Received her labs  Results for orders placed or performed in visit on 07/29/17  CBC  Result Value Ref Range   WBC 10.4 4.0 - 10.5 K/uL   RBC 5.01 3.87 - 5.11 Mil/uL   Platelets 412.0 (H) 150.0 - 400.0 K/uL   Hemoglobin 11.7 (L) 12.0 - 15.0 g/dL   HCT 37.5 36.0 - 46.0 %   MCV 74.8 (L) 78.0 - 100.0 fl   MCHC 31.4 30.0 - 36.0 g/dL   RDW 14.9 11.5 - 15.5 %  Comprehensive metabolic panel  Result Value Ref Range   Sodium 138 135 - 145 mEq/L   Potassium 3.8 3.5 - 5.1 mEq/L   Chloride 106 96 - 112 mEq/L   CO2 26 19 - 32 mEq/L   Glucose, Bld 91 70 - 99 mg/dL   BUN 11 6 - 23 mg/dL   Creatinine, Ser 0.83 0.40 - 1.20 mg/dL   Total Bilirubin 0.4 0.2 - 1.2  mg/dL   Alkaline Phosphatase 78 39 - 117 U/L   AST 12 0 - 37 U/L   ALT 12 0 - 35 U/L   Total Protein 7.9 6.0 - 8.3 g/dL   Albumin 4.1 3.5 - 5.2 g/dL   Calcium 9.4 8.4 - 10.5 mg/dL   GFR 96.90 >60.00 mL/min  Hemoglobin A1c  Result Value Ref Range   Hgb A1c MFr Bld 6.4 4.6 - 6.5 %  Lipid panel  Result Value Ref Range   Cholesterol 197 0 - 200 mg/dL   Triglycerides 90.0 0.0 - 149.0 mg/dL   HDL 51.00 >39.00 mg/dL   VLDL 18.0 0.0 - 40.0 mg/dL   LDL Cholesterol 128 (H) 0 - 99 mg/dL   Total CHOL/HDL Ratio 4    NonHDL 145.90   TSH  Result Value Ref Range   TSH 2.84 0.35 - 4.50 uIU/mL   Message to pt Your blood count is ok- minimal anemia, which is common in menstruating women  Metabolic profile is normal Your A1c is in the pre-diabetes range, but is quite close to the cut off for diabetes (6.5%). Please do work on weight loss to help prevent diabetes Your thyroid is normal, and cholesterol is overall ok  Please see me in 2 months as we discussed and take care!

## 2017-07-29 ENCOUNTER — Ambulatory Visit: Payer: BLUE CROSS/BLUE SHIELD | Admitting: Family Medicine

## 2017-07-29 ENCOUNTER — Encounter: Payer: Self-pay | Admitting: Family Medicine

## 2017-07-29 VITALS — BP 142/92 | HR 68 | Temp 98.4°F | Ht 66.0 in | Wt 268.0 lb

## 2017-07-29 DIAGNOSIS — R7303 Prediabetes: Secondary | ICD-10-CM

## 2017-07-29 DIAGNOSIS — R030 Elevated blood-pressure reading, without diagnosis of hypertension: Secondary | ICD-10-CM

## 2017-07-29 DIAGNOSIS — Z23 Encounter for immunization: Secondary | ICD-10-CM | POA: Diagnosis not present

## 2017-07-29 DIAGNOSIS — Z13220 Encounter for screening for lipoid disorders: Secondary | ICD-10-CM | POA: Diagnosis not present

## 2017-07-29 DIAGNOSIS — Z8632 Personal history of gestational diabetes: Secondary | ICD-10-CM | POA: Diagnosis not present

## 2017-07-29 DIAGNOSIS — R5383 Other fatigue: Secondary | ICD-10-CM

## 2017-07-29 DIAGNOSIS — F4323 Adjustment disorder with mixed anxiety and depressed mood: Secondary | ICD-10-CM | POA: Diagnosis not present

## 2017-07-29 DIAGNOSIS — E119 Type 2 diabetes mellitus without complications: Secondary | ICD-10-CM | POA: Insufficient documentation

## 2017-07-29 DIAGNOSIS — E1169 Type 2 diabetes mellitus with other specified complication: Secondary | ICD-10-CM | POA: Insufficient documentation

## 2017-07-29 LAB — CBC
HEMATOCRIT: 37.5 % (ref 36.0–46.0)
HEMOGLOBIN: 11.7 g/dL — AB (ref 12.0–15.0)
MCHC: 31.4 g/dL (ref 30.0–36.0)
MCV: 74.8 fl — AB (ref 78.0–100.0)
PLATELETS: 412 10*3/uL — AB (ref 150.0–400.0)
RBC: 5.01 Mil/uL (ref 3.87–5.11)
RDW: 14.9 % (ref 11.5–15.5)
WBC: 10.4 10*3/uL (ref 4.0–10.5)

## 2017-07-29 LAB — COMPREHENSIVE METABOLIC PANEL
ALT: 12 U/L (ref 0–35)
AST: 12 U/L (ref 0–37)
Albumin: 4.1 g/dL (ref 3.5–5.2)
Alkaline Phosphatase: 78 U/L (ref 39–117)
BILIRUBIN TOTAL: 0.4 mg/dL (ref 0.2–1.2)
BUN: 11 mg/dL (ref 6–23)
CALCIUM: 9.4 mg/dL (ref 8.4–10.5)
CHLORIDE: 106 meq/L (ref 96–112)
CO2: 26 meq/L (ref 19–32)
Creatinine, Ser: 0.83 mg/dL (ref 0.40–1.20)
GFR: 96.9 mL/min (ref >60.00)
Glucose, Bld: 91 mg/dL (ref 70–99)
POTASSIUM: 3.8 meq/L (ref 3.5–5.1)
Sodium: 138 mEq/L (ref 135–145)
Total Protein: 7.9 g/dL (ref 6.0–8.3)

## 2017-07-29 LAB — LIPID PANEL
CHOLESTEROL: 197 mg/dL (ref 0–200)
HDL: 51 mg/dL (ref >39.00)
LDL CALC: 128 mg/dL — AB (ref 0–99)
NonHDL: 145.9
TRIGLYCERIDES: 90 mg/dL (ref 0.0–149.0)
Total CHOL/HDL Ratio: 4
VLDL: 18 mg/dL (ref 0.0–40.0)

## 2017-07-29 LAB — HEMOGLOBIN A1C: Hgb A1c MFr Bld: 6.4 % (ref 4.6–6.5)

## 2017-07-29 LAB — TSH: TSH: 2.84 u[IU]/mL (ref 0.35–4.50)

## 2017-07-29 MED ORDER — DULOXETINE HCL 30 MG PO CPEP: 30.0000 mg | ORAL_CAPSULE | Freq: Two times a day (BID) | ORAL | 5 refills | Status: DC

## 2017-07-29 MED ORDER — DULOXETINE HCL 60 MG PO CPEP: 60.0000 mg | ORAL_CAPSULE | Freq: Two times a day (BID) | ORAL | 6 refills | Status: DC

## 2017-07-29 MED FILL — DULoxetine HCL 30 MG CPEP: 30 | 30 days supply | Qty: 60 | Fill #0

## 2017-07-29 NOTE — Patient Instructions (Signed)
It was a pleasure to see you today!  Take care and I will be in touch with your labs asap We will have you stat on cymbalta 30 mg twice a day- please let me know how this works for you over the next few weeks, and let's plan to visit in the office in about 2 months- sooner if you are not doing ok  Happy holidays!!

## 2018-02-15 NOTE — Progress Notes (Signed)
[place] at [facility] [address], [place], [place] [address] [phone]  Date:  02/17/2018   Name:  Teresa Perez   DOB:  07/03/1976   MRN:  [id number]  PCP:  Darreld Mclean, MD    Chief Complaint: Follow-up (Pt here for follow up visit. )   History of Present Illness:  Teresa Perez is a 42 y.o. very pleasant female patient who presents with the following:  I last saw her in November of last year: Here today as a new patient- Teresa Perez (rhymes with Media).  History of GDM, depression, obesity It looks like she just had her 6th baby over the summer, Dr. Nehemiah Settle is her GYN She now has a tubal ligation Her children are 22,18, 57, 6, 4 and her new baby The oldest are in college.   She would like to recheck her A1c today She has been toubled with depression and anxiety for a long time.  She is feeling more overwhelmed again, is crying more easily.   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 The cymbalta did seem like it was working pretty well for her and she would like to try this again  She is not sleeping well.  Her baby is sleeping well, this is not really the issue.  She tends to stay up too late watching TV.  She also does smoke cigs on occasion.   Her appetite is "like a wolf," she does not have any problems with this except for eating too much She feels like she is happy in her life and with her family,but she is overwhelmed some of the time. Anxiety is more of her problem than depression.   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 Continue to monitor her BP- it is borderline and she may need to go back on medication for control  Her A1c was close to DM cut off at last check Lab Results  Component Value Date   HGBA1C 6.5 02/17/2018   She did not end up taking the cymbalta for long - she tried it but it made her feel really anxious, she had "brain zaps," she had to stop taking it  There was a shooting at [facility] last fall where her son is a Ship broker- this shook her up an increased her anxiety She may have panic attacks on occasion-  generally this might occur only a few times a month, but more recently it has been a few times a week   She has used xanax in the past and tolerated this well.  Wonders if she might use this again for her panic sx   She is no longer nursing  She brings in a form from her job- her weight, blood sugar are up, BP is borderline.  They wanted her to try and  address these issues Wt Readings from Last 3 Encounters:  02/17/18 276 lb (125.2 kg)  07/29/17 268 lb (121.6 kg)  03/26/17 255 lb (115.7 kg)   She likes to use intermittent fasting, other diets  She has tried contrave, phentermine in the past but these did not work very well for her She would be interested in thinking about weight loss surgery  Patient Active Problem List   Diagnosis Date Noted  . Adjustment reaction with anxiety and depression 07/29/2017  . Borderline blood pressure 07/29/2017  . History of gestational diabetes 07/29/2017  . Pre-diabetes 07/29/2017  . Gestational diabetes mellitus (GDM), antepartum 08/17/2016  . History of conization of cervix affecting pregnancy, antepartum 08/05/2016  . Depression 01/02/2014  . PCOS (polycystic ovarian syndrome) 01/02/2014  . Severe obesity (BMI >= 40) ([place]) 06/22/2013    Past Medical History:   Diagnosis Date  . Acute bacterial bronchitis 11/05/2015  . Anemia   . Anxiety   . Asthma    childhood asthma  . Boil    tendency to form boils  . Depression    has been treated in past-no present meds  . Family history of anesthesia complication 9417   son- age 18 - "had lockjaw" after surgery (T&A)  . Gestational diabetes mellitus (GDM), antepartum 08/17/2016   Fasting on 2 hour was 107  . Headache(784.0)   . Menorrhagia 10/29/2015  . PCOS (polycystic ovarian syndrome)   . Pneumonia    AS A CHILD    Past Surgical History:  Procedure Laterality Date  . CERVICAL CONIZATION W/BX N/A 10/31/2015   Procedure: CONIZATION CERVIX WITH BIOPSY;  Surgeon: Eldred Manges, MD;  Location: [facility];  Service: Gynecology;  Laterality: N/A;  . CESAREAN SECTION     x2 previous  . CESAREAN SECTION  07/03/2011   Procedure: CESAREAN SECTION;  Surgeon: Alwyn Pea, MD;  Location: [facility];  Service: Gynecology;  Laterality: N/A;  . CESAREAN SECTION N/A 06/22/2013   Procedure: CESAREAN SECTION, repeat;  Surgeon: Eldred Manges, MD;  Location: [facility];  Service: Obstetrics;  Laterality: N/A;  . CESAREAN SECTION WITH BILATERAL TUBAL LIGATION Bilateral 02/14/2017   Procedure: CESAREAN SECTION WITH BILATERAL TUBAL LIGATION;  Surgeon: Osborne Oman, MD;  Location: [facility];  Service: Obstetrics;  Laterality: Bilateral;  . IUD REMOVAL N/A 10/31/2015   Procedure: INTRAUTERINE DEVICE (IUD) REMOVAL;  Surgeon: Eldred Manges, MD;  Location: [facility];  Service: Gynecology;  Laterality: N/A;  . MOUTH SURGERY  2004    Social History   Tobacco Use  . Smoking status: Current Some Day Smoker    Packs/day: 0.25    Years: 16.00    Pack years: 4.00    Types: Cigarettes  . Smokeless tobacco: Never Used  Substance Use Topics  . Alcohol use: No    Comment: occasional   . Drug use: No    Family History  Problem Relation Age of Onset  . Anesthesia problems Son        son-had T&A-2008-had trismus   . Diabetes Mother   . Hypertension Mother   . Hyperlipidemia Mother   . Arthritis Mother   . Anxiety disorder Mother   . Hypertension Father   . Hypertension Paternal Grandmother   . Hyperlipidemia Paternal Grandmother   . Depression Neg Hx     Allergies  Allergen Reactions  . Latex Rash and Itching    Medication list has been reviewed and updated.  Current Outpatient Medications on File Prior  to Visit  Medication Sig Dispense Refill  . clobetasol ointment (TEMOVATE) 0.05 % Apply to lesion on leg 2 x a day for 1 week.Cover with plastic wrap.Then use daily only as needed.Do not apply to face or groin.     No current facility-administered medications on file prior to visit.     Review of Systems:  As per HPI- otherwise negative. No fever or chills No CP or SOB  BP Readings from Last 3 Encounters:  02/17/18 126/86  07/29/17 (!) 142/92  03/26/17 (!) 142/79      Physical Examination: Vitals:   02/17/18 1136  BP: 126/86  Pulse: 77  Temp: 98.1 F (36.7 C)  SpO2: 93%   Vitals:   02/17/18 1136  Weight: 276 lb (125.2 kg)  Height: 5' 6.5" (1.689 m)   Body mass index is 43.88 kg/m. Ideal Body Weight: Weight in (lb) to have BMI = 25: 156.9  GEN: WDWN, NAD, Non-toxic, A & O x 3, obese, otherwise looks well HEENT: Atraumatic, Normocephalic. Neck supple. No masses, No LAD. Ears and Nose: No external deformity. CV: RRR, No M/G/R. No JVD. No thrill. No extra heart sounds. PULM: CTA B, no wheezes, crackles, rhonchi. No retractions. No resp. distress. No accessory muscle use. ABD: S, NT, ND EXTR: No c/c/e NEURO Normal gait.  PSYCH: Normally interactive. Conversant. Not depressed or anxious appearing.  Calm demeanor.    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  She is interested in looking into weight loss surgery and we discussed this together.  She will do the seminar and then let me know if she desires a referral  History of GDM- check A1c today She has started having more panic attacks.  Has already tried several daily meds including cymbalta, effexor, zoloft.  Will have he try prn xanax 0.25 prn for panic. However if she is using a lot of these will need to try another background med for her  Meds ordered this encounter  Medications  . ALPRAZolam (XANAX) 0.25 MG tablet    Sig: Take 1 tablet (0.25 mg total) by mouth 2 (two) times daily as needed for anxiety.    Dispense:  30 tablet    Refill:  0     Signed Lamar Blinks, MD  Received her labs  , Results for orders placed or performed in visit on 02/17/18  Hemoglobin A1c  Result Value Ref Range   Hgb A1c MFr Bld 6.5 4.6 - 6.5 %   Message to pt  Your A1c went up just slightly and is now consistent with diabetes.  We need to get a 2nd A1c at or above 6.5% to oficially diagnose diabetes, but likely you do have this disease.  I would suggest that we start you on metformin, an oral medication that may help prevent long term complications of diabetes and can even help with a bit of weight loss.  Is this something that you would like to try? You can let me know here.  In any case let's plan to meet in 4-6 months to see how you are  doing JC

## 2018-02-17 ENCOUNTER — Ambulatory Visit: Payer: BLUE CROSS/BLUE SHIELD | Admitting: Family Medicine

## 2018-02-17 ENCOUNTER — Encounter: Payer: Self-pay | Admitting: Family Medicine

## 2018-02-17 VITALS — BP 126/86 | HR 77 | Temp 98.1°F | Ht 66.5 in | Wt 276.0 lb

## 2018-02-17 DIAGNOSIS — R7303 Prediabetes: Secondary | ICD-10-CM | POA: Diagnosis not present

## 2018-02-17 DIAGNOSIS — Z8632 Personal history of gestational diabetes: Secondary | ICD-10-CM | POA: Diagnosis not present

## 2018-02-17 DIAGNOSIS — F410 Panic disorder [episodic paroxysmal anxiety] without agoraphobia: Secondary | ICD-10-CM

## 2018-02-17 LAB — HEMOGLOBIN A1C: HEMOGLOBIN A1C: 6.5 % (ref 4.6–6.5)

## 2018-02-17 MED ORDER — ALPRAZOLAM 0.25 MG PO TABS: 0.2500 mg | ORAL_TABLET | Freq: Two times a day (BID) | ORAL | 0 refills | Status: DC | PRN

## 2018-02-17 MED FILL — ALPRAZolam 0.25 MG TABS: 0.25 | 15 days supply | Qty: 30 | Fill #0

## 2018-02-17 NOTE — Patient Instructions (Addendum)
It was good to see you again today- let's check your A1c to make sure you have not developed diabetes Please check out the [facility] bariatric program seminar online or in person. If this is something you would like to pursue, send me a message and I will refer you for a consultation We will also use a low dose of xanax to use as needed for panic- this can be habit forming so use only for significant symptoms, as little as you can.  It can make you feel drowsy!

## 2018-02-21 ENCOUNTER — Ambulatory Visit: Payer: BLUE CROSS/BLUE SHIELD | Admitting: Family Medicine

## 2018-02-23 MED ORDER — METFORMIN HCL 500 MG PO TABS
500.0000 mg | ORAL_TABLET | Freq: Every day | ORAL | 3 refills | Status: DC
Start: 2018-02-23 — End: 2018-09-27

## 2018-05-25 ENCOUNTER — Other Ambulatory Visit: Payer: Self-pay | Admitting: Family Medicine

## 2018-05-25 DIAGNOSIS — Z23 Encounter for immunization: Secondary | ICD-10-CM | POA: Diagnosis not present

## 2018-05-25 DIAGNOSIS — F410 Panic disorder [episodic paroxysmal anxiety] without agoraphobia: Secondary | ICD-10-CM

## 2018-05-25 MED ORDER — ALPRAZOLAM 0.25 MG PO TABS: 0.2500 mg | ORAL_TABLET | Freq: Two times a day (BID) | ORAL | 0 refills | Status: DC | PRN

## 2018-05-25 MED FILL — ALPRAZolam 0.25 MG TABS: 0.25 | 15 days supply | Qty: 30 | Fill #0

## 2018-05-25 NOTE — Telephone Encounter (Signed)
Reviewed NCCSR today. Ok to refill xanax

## 2018-09-19 NOTE — Progress Notes (Addendum)
[place] at [facility] [address], [place], [place] [address] [phone]  Date:  09/22/2018   Name:  Teresa Perez   DOB:  December 06, 1975   MRN:  [id number]  PCP:  Darreld Mclean, MD    Chief Complaint: Annual Exam   History of Present Illness:  Teresa Perez is a 43 y.o. very pleasant female patient who presents with the following:  Here today for complete physical.  History of obesity, borderline blood pressure, gestational diabetes and prediabetes, monitoring for development of DM, PCOS, depression. Last seen by myself in June 2019 She has 6 children and is status post tubal ligation Over the summer she had noticed increased stress and panic attacks.  She had tried several medications including Cymbalta, Effexor, Zoloft.  We had her try as needed Xanax at that time. She also is interested in weight loss surgery, and I encouraged her to sign up for the seminar.  Labs: Due for full Pap: 2017, due now. She sees Dr. Nehemiah Settle and just had this done Mammogram: this was ordered for her  Immunizations:  Flu; done in October   She came off metformin- it seemed to cause diarrhea.  She used it for a little while last week but then stopped again  She got some labs through her job and she was dx with diabetes - this was done earlier this month.  She was not sure of her A1c at that time however.  She would like to repeat labs today    Lab Results  Component Value Date   HGBA1C 6.5 02/17/2018   Wt Readings from Last 3 Encounters:  09/22/18 280 lb (127 kg)  09/22/18 280 lb 1.3 oz (127 kg)  02/17/18 276 lb (125.2 kg)   She did a weight loss seminar online, she is still not sure.  She is thinking about going to the in person seminar as well   Asked about her mood, she has been struggling a bit recently She broke up with her long term partner recently- they were together for 43 years and he fathered 110 of her  kids  She is having a hard time  focusing on work, she is feeling anxious No SI  She is using xanax for a panic attack; she might take 1 or 2 a week.  However she finds when she does use the Xanax, 0.25 does not seem to be enough.  She usually has to take 2 She asks if we can increase her dose to 0.5  05/25/2018  2   05/25/2018  Alprazolam 0.25 Mg Tablet  30.00 15 Je Cop  [id number]  Med (5269)  0/0 1.00 LME Comm Ins  [place]  02/17/2018  2   02/17/2018  Alprazolam 0.25 Mg Tablet  30.00 15 Je Cop  [id number]  Med (5269)  0/0 1.00 LME Comm Ins  [place]  02/18/2017  1   02/17/2017  Oxycodone-Acetaminophen 5-325  30.00 5 Ma [place]  [id number]  [place] (2372)  0/0      She also wonders about weight loss medication, she had thought about trying [place]  She tried phentermine in the past, did not tolerate No personal or family history of thyroid cancer or MEN syndrome  No family history or personal history of pancreatitis Patient Active Problem List   Diagnosis Date Noted  . Adjustment reaction with anxiety and depression 07/29/2017  . Borderline blood pressure 07/29/2017  . History of gestational diabetes 07/29/2017  .  Pre-diabetes 07/29/2017  . Gestational diabetes mellitus (GDM), antepartum 08/17/2016  . History of conization of cervix affecting pregnancy, antepartum 08/05/2016  . Depression 01/02/2014  . PCOS (polycystic ovarian syndrome) 01/02/2014  . Severe obesity (BMI >= 40) ([place]) 06/22/2013    Past Medical History:  Diagnosis Date  . Acute bacterial bronchitis 11/05/2015  . Anemia   . Anxiety   . Asthma    childhood asthma  . Boil    tendency to form boils  . Depression    has been treated in past-no present meds  . Family history of anesthesia complication 0037   son- age 70 - "had lockjaw" after surgery (T&A)  . Gestational diabetes mellitus (GDM), antepartum 08/17/2016   Fasting on 2 hour was 107  . Headache(784.0)   . Menorrhagia 10/29/2015  . PCOS (polycystic ovarian syndrome)   . Pneumonia    AS A CHILD    Past Surgical  History:  Procedure Laterality Date  . CERVICAL CONIZATION W/BX N/A 10/31/2015   Procedure: CONIZATION CERVIX WITH BIOPSY;  Surgeon: Eldred Manges, MD;  Location: [facility];  Service: Gynecology;  Laterality: N/A;  . CESAREAN SECTION     x2 previous  . CESAREAN SECTION  07/03/2011   Procedure: CESAREAN SECTION;  Surgeon: Alwyn Pea, MD;  Location: [facility];  Service: Gynecology;  Laterality: N/A;  . CESAREAN SECTION N/A 06/22/2013   Procedure: CESAREAN SECTION, repeat;  Surgeon: Eldred Manges, MD;  Location: [facility];  Service: Obstetrics;  Laterality: N/A;  . CESAREAN SECTION WITH BILATERAL TUBAL LIGATION Bilateral 02/14/2017   Procedure: CESAREAN SECTION WITH BILATERAL TUBAL LIGATION;  Surgeon: Osborne Oman, MD;  Location: [facility];  Service: Obstetrics;  Laterality: Bilateral;  . IUD REMOVAL N/A 10/31/2015   Procedure: INTRAUTERINE DEVICE (IUD) REMOVAL;  Surgeon: Eldred Manges, MD;  Location: [facility];  Service: Gynecology;  Laterality: N/A;  . MOUTH SURGERY  2004    Social History   Tobacco Use  . Smoking status: Current Some Day Smoker    Packs/day: 0.25    Years: 16.00    Pack years: 4.00    Types: Cigarettes  . Smokeless tobacco: Never Used  Substance Use Topics  . Alcohol use: No    Comment: occasional   . Drug use: No    Family History  Problem Relation Age of Onset  . Anesthesia problems Son        son-had T&A-2008-had trismus  . Diabetes Mother   . Hypertension Mother   . Hyperlipidemia Mother   . Arthritis Mother   . Anxiety disorder Mother   . Hypertension Father   . Hypertension Paternal Grandmother   . Hyperlipidemia Paternal Grandmother   . Depression Neg Hx     Allergies  Allergen Reactions  . Latex Rash and Itching    Medication list has been reviewed and updated.  Current Outpatient Medications on File Prior to Visit  Medication Sig Dispense Refill  . ALPRAZolam (XANAX) 0.25 MG tablet Take 1 tablet (0.25 mg total) by mouth 2  (two) times daily as needed for anxiety. 30 tablet 0  . clobetasol ointment (TEMOVATE) 0.05 % Apply to lesion on leg 2 x a day for 1 week.Cover with plastic wrap.Then use daily only as needed.Do not apply to face or groin.    . metFORMIN (GLUCOPHAGE) 500 MG tablet Take 1 tablet (500 mg total) by mouth daily. (Patient not taking: Reported on 09/22/2018) 90 tablet 3   No current facility-administered medications  on file prior to visit.     Review of Systems:  As per HPI- otherwise negative. No fever or chills, no chest pain or shortness of breath  Physical Examination: Vitals:   09/22/18 1458  BP: 132/86  Pulse: 67  Resp: 16  Temp: 97.8 F (36.6 C)  SpO2: 97%   Vitals:   09/22/18 1458  Weight: 280 lb (127 kg)  Height: 5' 6.5" (1.689 m)   Body mass index is 44.52 kg/m. Ideal Body Weight: Weight in (lb) to have BMI = 25: 156.9  GEN: WDWN, NAD, Non-toxic, A & O x 3, obese, looks well  HEENT: Atraumatic, Normocephalic. Neck supple. No masses, No LAD. Ears and Nose: No external deformity. CV: RRR, No M/G/R. No JVD. No thrill. No extra heart sounds. PULM: CTA B, no wheezes, crackles, rhonchi. No retractions. No resp. distress. No accessory muscle use. ABD: S, NT, ND, +BS. No rebound. No HSM. EXTR: No c/c/e NEURO Normal gait.  PSYCH: Normally interactive. Conversant. Not depressed or anxious appearing.  Calm demeanor.    Assessment and Plan: Physical exam  Pre-diabetes - Plan: Comprehensive metabolic panel, Hemoglobin A1c  Screening for deficiency anemia - Plan: CBC  Screening for hyperlipidemia - Plan: Lipid panel  Screening for cervical cancer  Screening for breast cancer  Situational mixed anxiety and depressive disorder - Plan: FLUoxetine (PROZAC) 20 MG tablet  Morbid obesity ([place]) - Plan: Liraglutide -Weight Management (SAXENDA) 18 MG/3ML SOPN  Panic attacks - Plan: ALPRAZolam (XANAX) 0.5 MG tablet  Here today for complete physical She has been under some  stress, as she broke up with her partner of more than a decade just recently.  She asked to increase her Xanax to 0.5, as when she takes a pill she notes that she generally needs two.  She does not use it that often however, states she uses it typically once or twice per week.   I did give her prescription for 0.5 mg strength.   Right also prescribed Prozac 20 mg, she is to take 1 pill for 2 weeks and then may increase it to 2 pills if desired. She is also looking into seeing a counselor  Draw labs as above, I will be in touch with her results.  She may well have a diagnosis of diabetes. Blood pressure looks okay For obesity, she is still think about weight loss surgery but has not decided to pursue this as of yet.  She would like to try Saxenda, which I will prescribe for her.  We went over how to use this today  Will plan further follow- up pending labs.   Signed Lamar Blinks, MD  Addendum 1/24-message to patient  Your A1c-average blood sugar-is consistent with diagnosis of diabetes.  Would you be willing to try metformin again, perhaps at a lower dose?  Please let me know your thoughts Your cholesterol is overall good, but since you have diabetes we would like to be more aggressive in lowering your LDL.  I would recommend a low-dose of cholesterol medication if you would be willing to try it Metabolic profile looks fine Your blood counts show mild anemia, which I suggested is due to iron loss.  This may be related to menstrual bleeding.  Do your periods tend to be heavy? Also, your white cell count is slightly high.  This may be due to stress  Please let me know about 1.  Metformin 2.  Cholesterol medicine 3.  Menstrual bleeding  I am going to  order a repeat blood count and iron level for you.  Please come by to have these drawn as a lab visit only in about 1 month  I ordered a CBC and ferritin as future Results for orders placed or performed in visit on 09/22/18  CBC  Result  Value Ref Range   WBC 14.0 (H) 4.0 - 10.5 K/uL   RBC 5.02 3.87 - 5.11 Mil/uL   Platelets 455.0 (H) 150.0 - 400.0 K/uL   Hemoglobin 10.5 (L) 12.0 - 15.0 g/dL   HCT 34.9 (L) 36.0 - 46.0 %   MCV 69.2 Repeated and verified X2. (L) 78.0 - 100.0 fl   MCHC 30.1 30.0 - 36.0 g/dL   RDW 15.7 (H) 11.5 - 15.5 %  Comprehensive metabolic panel  Result Value Ref Range   Sodium 139 135 - 145 mEq/L   Potassium 3.6 3.5 - 5.1 mEq/L   Chloride 103 96 - 112 mEq/L   CO2 27 19 - 32 mEq/L   Glucose, Bld 104 (H) 70 - 99 mg/dL   BUN 8 6 - 23 mg/dL   Creatinine, Ser 0.72 0.40 - 1.20 mg/dL   Total Bilirubin 0.3 0.2 - 1.2 mg/dL   Alkaline Phosphatase 71 39 - 117 U/L   AST 12 0 - 37 U/L   ALT 10 0 - 35 U/L   Total Protein 7.3 6.0 - 8.3 g/dL   Albumin 4.1 3.5 - 5.2 g/dL   Calcium 9.4 8.4 - 10.5 mg/dL   GFR 106.83 >60.00 mL/min  Hemoglobin A1c  Result Value Ref Range   Hgb A1c MFr Bld 7.0 (H) 4.6 - 6.5 %  Lipid panel  Result Value Ref Range   Cholesterol 184 0 - 200 mg/dL   Triglycerides 99.0 0.0 - 149.0 mg/dL   HDL 47.40 >39.00 mg/dL   VLDL 19.8 0.0 - 40.0 mg/dL   LDL Cholesterol 117 (H) 0 - 99 mg/dL   Total CHOL/HDL Ratio 4    NonHDL 137.08    The 10-year ASCVD risk score Mikey Bussing DC Jr., et al., 2013) is: 5.8%   Values used to calculate the score:     Age: 51 years     Sex: Female     Is Non-Hispanic African American: Yes     Diabetic: Yes     Tobacco smoker: Yes     Systolic Blood Pressure: 872 mmHg     Is BP treated: No     HDL Cholesterol: 47.4 mg/dL     Total Cholesterol: 184 mg/dL  Anemia is somewhat worse, but not new.  Leukocytosis noted Cholesterol stable A1c is consistent with diagnosis of diabetes

## 2018-09-22 ENCOUNTER — Encounter: Payer: Self-pay | Admitting: Family Medicine

## 2018-09-22 ENCOUNTER — Other Ambulatory Visit (HOSPITAL_COMMUNITY)
Admission: RE | Admit: 2018-09-22 | Discharge: 2018-09-22 | Disposition: A | Discharge status: Home / Self Care | Payer: BLUE CROSS/BLUE SHIELD | Source: Ambulatory Visit | Attending: Family Medicine | Admitting: Family Medicine

## 2018-09-22 ENCOUNTER — Ambulatory Visit (INDEPENDENT_AMBULATORY_CARE_PROVIDER_SITE_OTHER): Payer: BLUE CROSS/BLUE SHIELD | Admitting: Family Medicine

## 2018-09-22 VITALS — BP 132/86 | HR 67 | Temp 97.8°F | Resp 16 | Ht 66.5 in | Wt 280.0 lb

## 2018-09-22 VITALS — BP 135/86 | HR 67 | Ht 66.5 in | Wt 280.1 lb

## 2018-09-22 DIAGNOSIS — Z9289 Personal history of other medical treatment: Secondary | ICD-10-CM

## 2018-09-22 DIAGNOSIS — Z13220 Encounter for screening for lipoid disorders: Secondary | ICD-10-CM | POA: Diagnosis not present

## 2018-09-22 DIAGNOSIS — Z01419 Encounter for gynecological examination (general) (routine) without abnormal findings: Secondary | ICD-10-CM | POA: Insufficient documentation

## 2018-09-22 DIAGNOSIS — F410 Panic disorder [episodic paroxysmal anxiety] without agoraphobia: Secondary | ICD-10-CM

## 2018-09-22 DIAGNOSIS — F4323 Adjustment disorder with mixed anxiety and depressed mood: Secondary | ICD-10-CM

## 2018-09-22 DIAGNOSIS — Z0000 Encounter for general adult medical examination without abnormal findings: Secondary | ICD-10-CM | POA: Diagnosis not present

## 2018-09-22 DIAGNOSIS — Z1239 Encounter for other screening for malignant neoplasm of breast: Secondary | ICD-10-CM

## 2018-09-22 DIAGNOSIS — Z130 Encounter for screening for diseases of the blood and blood-forming organs and certain disorders involving the immune mechanism: Secondary | ICD-10-CM | POA: Diagnosis not present

## 2018-09-22 DIAGNOSIS — D509 Iron deficiency anemia, unspecified: Secondary | ICD-10-CM

## 2018-09-22 DIAGNOSIS — Z124 Encounter for screening for malignant neoplasm of cervix: Secondary | ICD-10-CM

## 2018-09-22 DIAGNOSIS — R7303 Prediabetes: Secondary | ICD-10-CM

## 2018-09-22 MED ORDER — FLUOXETINE HCL 20 MG PO TABS: 20.0000 mg | ORAL_TABLET | Freq: Every day | ORAL | 9 refills | Status: DC

## 2018-09-22 MED ORDER — LIRAGLUTIDE -WEIGHT MANAGEMENT 18 MG/3ML ~~LOC~~ SOPN: 0.6000 mg | PEN_INJECTOR | Freq: Every day | SUBCUTANEOUS | 6 refills | Status: DC

## 2018-09-22 MED ORDER — ALPRAZOLAM 0.5 MG PO TABS: 0.2500 mg | ORAL_TABLET | Freq: Two times a day (BID) | ORAL | 1 refills | Status: DC | PRN

## 2018-09-22 MED FILL — FLUoxetine HCL 20 MG TABS: 20 | 30 days supply | Qty: 46 | Fill #0

## 2018-09-22 MED FILL — ALPRAZolam 0.5 MG TABS: 0.5 | 30 days supply | Qty: 30 | Fill #0

## 2018-09-22 NOTE — Progress Notes (Signed)
GYNECOLOGY ANNUAL PREVENTATIVE CARE ENCOUNTER NOTE  Subjective:   Teresa Perez is a 43 y.o. [phone] female here for a routine annual gynecologic exam.  Current complaints: weight gain.   Denies abnormal vaginal bleeding, discharge, pelvic pain, problems with intercourse or other gynecologic concerns.    Gynecologic History Patient's last menstrual period was 09/10/2018. Patient is not currently sexually active  Contraception: tubal ligation Last Pap: 3 years ago. Results were: normal Last mammogram: n/a. Obstetric History OB History  Gravida Para Term Preterm AB Living  6 6 6  0 0 6  SAB TAB Ectopic Multiple Live Births  0 0 0 0 6    # Outcome Date GA Lbr Len/2nd Weight Sex Delivery Anes PTL Lv  6 Term 02/14/17 [redacted]w[redacted]d 8 lb 4.1 oz (3.745 kg) M CS-LTranv Spinal  LIV  5 Term 06/22/13 [id number] lb 8.1 oz (2.95 kg) F CS-LTranv EPI  LIV  4 Term 07/03/11 4083w0d lb 15.3 oz (3.155 kg) F CS-LTranv Spinal  LIV     Birth Comments: normal AGA term  3 Term 04/2011    F CS-Unspec Gen  LIV     Birth Comments: fetal distress, placenta abruptio  2 Term 07/1998   8 lb 1 oz (3.657 kg) M Vag-Spont   LIV  1 Term 04/1995   8 lb 8 oz (3.856 kg) M CS-Unspec EPI  LIV    Past Medical History:  Diagnosis Date  . Acute bacterial bronchitis 11/05/2015  . Anemia   . Anxiety   . Asthma    childhood asthma  . Boil    tendency to form boils  . Depression    has been treated in past-no present meds  . Family history of anesthesia complication [id number]- age 105 -41"had lockjaw" after surgery (T&A)  . Gestational diabetes mellitus (GDM), antepartum 08/17/2016   Fasting on 2 hour was 107  . Headache(784.0)   . Menorrhagia 10/29/2015  . PCOS (polycystic ovarian syndrome)   . Pneumonia    AS A CHILD    Past Surgical History:  Procedure Laterality Date  . CERVICAL CONIZATION W/BX N/A 10/31/2015   Procedure: CONIZATION CERVIX WITH BIOPSY;  Surgeon: VanEldred MangesD;  [facility];  Service:  Gynecology;  Laterality: N/A;  . CESAREAN SECTION     x2 previous  . CESAREAN SECTION  07/03/2011   Procedure: CESAREAN SECTION;  Surgeon: SanAlwyn PeaD;  [facility];  Service: Gynecology;  Laterality: N/A;  . CESAREAN SECTION N/A 06/22/2013   Procedure: CESAREAN SECTION, repeat;  Surgeon: VanEldred MangesD;  [facility];  Service: Obstetrics;  Laterality: N/A;  . CESAREAN SECTION WITH BILATERAL TUBAL LIGATION Bilateral 02/14/2017   Procedure: CESAREAN SECTION WITH BILATERAL TUBAL LIGATION;  Surgeon: AnyOsborne OmanD;  [facility];  Laterality: Bilateral;  . IUD REMOVAL N/A 10/31/2015   Procedure: INTRAUTERINE DEVICE (IUD) REMOVAL;  Surgeon: VanEldred MangesD;  [facility];  Service: Gynecology;  Laterality: N/A;  . MOUTH SURGERY  2004    Current Outpatient Medications on File Prior to Visit  Medication Sig Dispense Refill  . ALPRAZolam (XANAX) 0.25 MG tablet Take 1 tablet (0.25 mg total) by mouth 2 (two) times daily as needed for anxiety. 30 tablet 0  . clobetasol ointment (TEMOVATE) 0.05 % Apply to lesion on leg 2 x a day for 1 week.Cover with plastic wrap.Then use daily only as  needed.Do not apply to face or groin.    . metFORMIN (GLUCOPHAGE) 500 MG tablet Take 1 tablet (500 mg total) by mouth daily. (Patient not taking: Reported on 09/22/2018) 90 tablet 3   No current facility-administered medications on file prior to visit.     Allergies  Allergen Reactions  . Latex Rash and Itching    Social History   Socioeconomic History  . Marital status: Single    Spouse name: Not on file  . Number of children: Not on file  . Years of education: Not on file  . Highest education level: Not on file  Occupational History  . Not on file  Social Needs  . Financial resource strain: Not on file  . Food insecurity:    Worry: Not on file    Inability: Not on file  . Transportation needs:    Medical: Not on file     Non-medical: Not on file  Tobacco Use  . Smoking status: Current Some Day Smoker    Packs/day: 0.25    Years: 16.00    Pack years: 4.00    Types: Cigarettes  . Smokeless tobacco: Never Used  Substance and Sexual Activity  . Alcohol use: No    Comment: occasional   . Drug use: No  . Sexual activity: Yes    Birth control/protection: None  Lifestyle  . Physical activity:    Days per week: Not on file    Minutes per session: Not on file  . Stress: Not on file  Relationships  . Social connections:    Talks on phone: Not on file    Gets together: Not on file    Attends religious service: Not on file    Active member of club or organization: Not on file    Attends meetings of clubs or organizations: Not on file    Relationship status: Not on file  . Intimate partner violence:    Fear of current or ex partner: Not on file    Emotionally abused: Not on file    Physically abused: Not on file    Forced sexual activity: Not on file  Other Topics Concern  . Not on file  Social History Narrative  . Not on file    Family History  Problem Relation Age of Onset  . Anesthesia problems Son        son-had T&A-2008-had trismus  . Diabetes Mother   . Hypertension Mother   . Hyperlipidemia Mother   . Arthritis Mother   . Anxiety disorder Mother   . Hypertension Father   . Hypertension Paternal Grandmother   . Hyperlipidemia Paternal Grandmother   . Depression Neg Hx     The following portions of the patient's history were reviewed and updated as appropriate: allergies, current medications, past family history, past medical history, past social history, past surgical history and problem list.  Review of Systems Pertinent items are noted in HPI.   Objective:  BP 135/86   Pulse 67   Ht 5' 6.5" (1.689 m)   Wt 280 lb 1.3 oz (127 kg)   LMP 09/10/2018   BMI 44.53 kg/m  Wt Readings from Last 3 Encounters:  09/22/18 280 lb 1.3 oz (127 kg)  02/17/18 276 lb (125.2 kg)  07/29/17 268  lb (121.6 kg)     CONSTITUTIONAL: Well-developed, well-nourished female in no acute distress.  HENT:  Normocephalic, atraumatic, External right and left ear normal. Oropharynx is clear and moist EYES: Conjunctivae and EOM are  normal. Pupils are equal, round, and reactive to light. No scleral icterus.  NECK: Normal range of motion, supple, no masses.  Normal thyroid.   CARDIOVASCULAR: Normal heart rate noted, regular rhythm RESPIRATORY: Clear to auscultation bilaterally. Effort and breath sounds normal, no problems with respiration noted. BREASTS: Symmetric in size. No masses, skin changes, nipple drainage, or lymphadenopathy. ABDOMEN: Soft, normal bowel sounds, no distention noted.  No tenderness, rebound or guarding.  PELVIC: Normal appearing external genitalia; normal appearing vaginal mucosa and cervix.  No abnormal discharge noted.  Normal uterine size, no other palpable masses, no uterine or adnexal tenderness. MUSCULOSKELETAL: Normal range of motion. No tenderness.  No cyanosis, clubbing, or edema.  2+ distal pulses. SKIN: Skin is warm and dry. No rash noted. Not diaphoretic. No erythema. No pallor. NEUROLOGIC: Alert and oriented to person, place, and time. Normal reflexes, muscle tone coordination. No cranial nerve deficit noted. PSYCHIATRIC: Normal mood and affect. Normal behavior. Normal judgment and thought content.  Assessment:  Annual gynecologic examination with pap smear   Plan:  1. Well Woman Exam Will follow up results of pap smear and manage accordingly. Mammogram scheduled STD testing discussed. Patient requested testing   Routine preventative health maintenance measures emphasized. Please refer to After Visit Summary for other counseling recommendations.    Loma Boston, [place] for [employer]

## 2018-09-22 NOTE — Patient Instructions (Addendum)
I will be in touch with your labs asap If your A1c is over 6.5% again, we will have to say that you have diabetes  We will take care of this if need be! I am going to rx saxenda for you- will take care ok this  Start on prozac 20 mg one daily for 2 weeks, then increase to 2 pills a day  Health Maintenance, Female Adopting a healthy lifestyle and getting preventive care can go a long way to promote health and wellness. Talk with your health care provider about what schedule of regular examinations is right for you. This is a good chance for you to check in with your provider about disease prevention and staying healthy. In between checkups, there are plenty of things you can do on your own. Experts have done a lot of research about which lifestyle changes and preventive measures are most likely to keep you healthy. Ask your health care provider for more information. Weight and diet Eat a healthy diet  Be sure to include plenty of vegetables, fruits, low-fat dairy products, and lean protein.  Do not eat a lot of foods high in solid fats, added sugars, or salt.  Get regular exercise. This is one of the most important things you can do for your health. ? Most adults should exercise for at least 150 minutes each week. The exercise should increase your heart rate and make you sweat (moderate-intensity exercise). ? Most adults should also do strengthening exercises at least twice a week. This is in addition to the moderate-intensity exercise. Maintain a healthy weight  Body mass index (BMI) is a measurement that can be used to identify possible weight problems. It estimates body fat based on height and weight. Your health care provider can help determine your BMI and help you achieve or maintain a healthy weight.  For females 67 years of age and older: ? A BMI below 18.5 is considered underweight. ? A BMI of 18.5 to 24.9 is normal. ? A BMI of 25 to 29.9 is considered overweight. ? A BMI of 30  and above is considered obese. Watch levels of cholesterol and blood lipids  You should start having your blood tested for lipids and cholesterol at 44 years of age, then have this test every 5 years.  You may need to have your cholesterol levels checked more often if: ? Your lipid or cholesterol levels are high. ? You are older than 43 years of age. ? You are at high risk for heart disease. Cancer screening Lung Cancer  Lung cancer screening is recommended for adults 43-72 years old who are at high risk for lung cancer because of a history of smoking.  A yearly low-dose CT scan of the lungs is recommended for people who: ? Currently smoke. ? Have quit within the past 15 years. ? Have at least a 30-pack-year history of smoking. A pack year is smoking an average of one pack of cigarettes a day for 1 year.  Yearly screening should continue until it has been 15 years since you quit.  Yearly screening should stop if you develop a health problem that would prevent you from having lung cancer treatment. Breast Cancer  Practice breast self-awareness. This means understanding how your breasts normally appear and feel.  It also means doing regular breast self-exams. Let your health care provider know about any changes, no matter how small.  If you are in your 20s or 30s, you should have a clinical breast exam (  CBE) by a health care provider every 1-3 years as part of a regular health exam.  If you are 58 or older, have a CBE every year. Also consider having a breast X-ray (mammogram) every year.  If you have a family history of breast cancer, talk to your health care provider about genetic screening.  If you are at high risk for breast cancer, talk to your health care provider about having an MRI and a mammogram every year.  Breast cancer gene (BRCA) assessment is recommended for women who have family members with BRCA-related cancers. BRCA-related cancers  include: ? Breast. ? Ovarian. ? Tubal. ? Peritoneal cancers.  Results of the assessment will determine the need for genetic counseling and BRCA1 and BRCA2 testing. Cervical Cancer Your health care provider may recommend that you be screened regularly for cancer of the pelvic organs (ovaries, uterus, and vagina). This screening involves a pelvic examination, including checking for microscopic changes to the surface of your cervix (Pap test). You may be encouraged to have this screening done every 3 years, beginning at age 62.  For women ages 69-65, health care providers may recommend pelvic exams and Pap testing every 3 years, or they may recommend the Pap and pelvic exam, combined with testing for human papilloma virus (HPV), every 5 years. Some types of HPV increase your risk of cervical cancer. Testing for HPV may also be done on women of any age with unclear Pap test results.  Other health care providers may not recommend any screening for nonpregnant women who are considered low risk for pelvic cancer and who do not have symptoms. Ask your health care provider if a screening pelvic exam is right for you.  If you have had past treatment for cervical cancer or a condition that could lead to cancer, you need Pap tests and screening for cancer for at least 20 years after your treatment. If Pap tests have been discontinued, your risk factors (such as having a new sexual partner) need to be reassessed to determine if screening should resume. Some women have medical problems that increase the chance of getting cervical cancer. In these cases, your health care provider may recommend more frequent screening and Pap tests. Colorectal Cancer  This type of cancer can be detected and often prevented.  Routine colorectal cancer screening usually begins at 44 years of age and continues through 44 years of age.  Your health care provider may recommend screening at an earlier age if you have risk factors for  colon cancer.  Your health care provider may also recommend using home test kits to check for hidden blood in the stool.  A small camera at the end of a tube can be used to examine your colon directly (sigmoidoscopy or colonoscopy). This is done to check for the earliest forms of colorectal cancer.  Routine screening usually begins at age 42.  Direct examination of the colon should be repeated every 5-10 years through 44 years of age. However, you may need to be screened more often if early forms of precancerous polyps or small growths are found. Skin Cancer  Check your skin from head to toe regularly.  Tell your health care provider about any new moles or changes in moles, especially if there is a change in a mole's shape or color.  Also tell your health care provider if you have a mole that is larger than the size of a pencil eraser.  Always use sunscreen. Apply sunscreen liberally and repeatedly throughout  the day.  Protect yourself by wearing long sleeves, pants, a wide-brimmed hat, and sunglasses whenever you are outside. Heart disease, diabetes, and high blood pressure  High blood pressure causes heart disease and increases the risk of stroke. High blood pressure is more likely to develop in: ? People who have blood pressure in the high end of the normal range (130-139/85-89 mm Hg). ? People who are overweight or obese. ? People who are African American.  If you are 67-73 years of age, have your blood pressure checked every 3-5 years. If you are 32 years of age or older, have your blood pressure checked every year. You should have your blood pressure measured twice-once when you are at a hospital or clinic, and once when you are not at a hospital or clinic. Record the average of the two measurements. To check your blood pressure when you are not at a hospital or clinic, you can use: ? An automated blood pressure machine at a pharmacy. ? A home blood pressure monitor.  If you are  between 23 years and 80 years old, ask your health care provider if you should take aspirin to prevent strokes.  Have regular diabetes screenings. This involves taking a blood sample to check your fasting blood sugar level. ? If you are at a normal weight and have a low risk for diabetes, have this test once every three years after 44 years of age. ? If you are overweight and have a high risk for diabetes, consider being tested at a younger age or more often. Preventing infection Hepatitis B  If you have a higher risk for hepatitis B, you should be screened for this virus. You are considered at high risk for hepatitis B if: ? You were born in a country where hepatitis B is common. Ask your health care provider which countries are considered high risk. ? Your parents were born in a high-risk country, and you have not been immunized against hepatitis B (hepatitis B vaccine). ? You have HIV or AIDS. ? You use needles to inject street drugs. ? You live with someone who has hepatitis B. ? You have had sex with someone who has hepatitis B. ? You get hemodialysis treatment. ? You take certain medicines for conditions, including cancer, organ transplantation, and autoimmune conditions. Hepatitis C  Blood testing is recommended for: ? Everyone born from [birth year] through 1966. ? Anyone with known risk factors for hepatitis C. Sexually transmitted infections (STIs)  You should be screened for sexually transmitted infections (STIs) including gonorrhea and chlamydia if: ? You are sexually active and are younger than 44 years of age. ? You are older than 44 years of age and your health care provider tells you that you are at risk for this type of infection. ? Your sexual activity has changed since you were last screened and you are at an increased risk for chlamydia or gonorrhea. Ask your health care provider if you are at risk.  If you do not have HIV, but are at risk, it may be recommended that you take  a prescription medicine daily to prevent HIV infection. This is called pre-exposure prophylaxis (PrEP). You are considered at risk if: ? You are sexually active and do not regularly use condoms or know the HIV status of your partner(s). ? You take drugs by injection. ? You are sexually active with a partner who has HIV. Talk with your health care provider about whether you are at high risk of being  infected with HIV. If you choose to begin PrEP, you should first be tested for HIV. You should then be tested every 3 months for as long as you are taking PrEP. Pregnancy  If you are premenopausal and you may become pregnant, ask your health care provider about preconception counseling.  If you may become pregnant, take 400 to 800 micrograms (mcg) of folic acid every day.  If you want to prevent pregnancy, talk to your health care provider about birth control (contraception). Osteoporosis and menopause  Osteoporosis is a disease in which the bones lose minerals and strength with aging. This can result in serious bone fractures. Your risk for osteoporosis can be identified using a bone density scan.  If you are 62 years of age or older, or if you are at risk for osteoporosis and fractures, ask your health care provider if you should be screened.  Ask your health care provider whether you should take a calcium or vitamin D supplement to lower your risk for osteoporosis.  Menopause may have certain physical symptoms and risks.  Hormone replacement therapy may reduce some of these symptoms and risks. Talk to your health care provider about whether hormone replacement therapy is right for you. Follow these instructions at home:  Schedule regular health, dental, and eye exams.  Stay current with your immunizations.  Do not use any tobacco products including cigarettes, chewing tobacco, or electronic cigarettes.  If you are pregnant, do not drink alcohol.  If you are breastfeeding, limit how  much and how often you drink alcohol.  Limit alcohol intake to no more than 1 drink per day for nonpregnant women. One drink equals 12 ounces of beer, 5 ounces of wine, or 1 ounces of hard liquor.  Do not use street drugs.  Do not share needles.  Ask your health care provider for help if you need support or information about quitting drugs.  Tell your health care provider if you often feel depressed.  Tell your health care provider if you have ever been abused or do not feel safe at home. This information is not intended to replace advice given to you by your health care provider. Make sure you discuss any questions you have with your health care provider. Document Released: 03/02/2011 Document Revised: 01/23/2016 Document Reviewed: 05/21/2015 Elsevier Interactive Patient Education  2019 [employer].

## 2018-09-22 NOTE — Progress Notes (Signed)
Pt needs Pap and mammogram.

## 2018-09-23 ENCOUNTER — Encounter: Payer: Self-pay | Admitting: Family Medicine

## 2018-09-23 ENCOUNTER — Telehealth: Payer: Self-pay

## 2018-09-23 DIAGNOSIS — E119 Type 2 diabetes mellitus without complications: Secondary | ICD-10-CM

## 2018-09-23 DIAGNOSIS — Z0279 Encounter for issue of other medical certificate: Secondary | ICD-10-CM

## 2018-09-23 DIAGNOSIS — E785 Hyperlipidemia, unspecified: Secondary | ICD-10-CM

## 2018-09-23 LAB — COMPREHENSIVE METABOLIC PANEL
ALT: 10 U/L (ref 0–35)
AST: 12 U/L (ref 0–37)
Albumin: 4.1 g/dL (ref 3.5–5.2)
Alkaline Phosphatase: 71 U/L (ref 39–117)
BUN: 8 mg/dL (ref 6–23)
CHLORIDE: 103 meq/L (ref 96–112)
CO2: 27 mEq/L (ref 19–32)
Calcium: 9.4 mg/dL (ref 8.4–10.5)
Creatinine, Ser: 0.72 mg/dL (ref 0.40–1.20)
GFR: 106.83 mL/min (ref >60.00)
GLUCOSE: 104 mg/dL — AB (ref 70–99)
POTASSIUM: 3.6 meq/L (ref 3.5–5.1)
SODIUM: 139 meq/L (ref 135–145)
Total Bilirubin: 0.3 mg/dL (ref 0.2–1.2)
Total Protein: 7.3 g/dL (ref 6.0–8.3)

## 2018-09-23 LAB — CBC
HEMATOCRIT: 34.9 % — AB (ref 36.0–46.0)
Hemoglobin: 10.5 g/dL — ABNORMAL LOW (ref 12.0–15.0)
MCHC: 30.1 g/dL (ref 30.0–36.0)
Platelets: 455 10*3/uL — ABNORMAL HIGH (ref 150.0–400.0)
RBC: 5.02 Mil/uL (ref 3.87–5.11)
RDW: 15.7 % — ABNORMAL HIGH (ref 11.5–15.5)
WBC: 14 10*3/uL — ABNORMAL HIGH (ref 4.0–10.5)

## 2018-09-23 LAB — LIPID PANEL
CHOL/HDL RATIO: 4
Cholesterol: 184 mg/dL (ref 0–200)
HDL: 47.4 mg/dL (ref >39.00)
LDL Cholesterol: 117 mg/dL — ABNORMAL HIGH (ref 0–99)
NONHDL: 137.08
TRIGLYCERIDES: 99 mg/dL (ref 0.0–149.0)
VLDL: 19.8 mg/dL (ref 0.0–40.0)

## 2018-09-23 LAB — HEMOGLOBIN A1C: HEMOGLOBIN A1C: 7 % — AB (ref 4.6–6.5)

## 2018-09-23 NOTE — Addendum Note (Signed)
Addended by: Lamar Blinks C on: 09/23/2018 11:51 AM   Modules accepted: Orders

## 2018-09-23 NOTE — Telephone Encounter (Signed)
PA initiated via Covermymeds; KEY: [id number]. Awaiting determination.

## 2018-09-26 LAB — CYTOLOGY - PAP
Adequacy: ABSENT
Chlamydia: NEGATIVE
Diagnosis: NEGATIVE
HPV (WINDOPATH): NOT DETECTED
Neisseria Gonorrhea: NEGATIVE

## 2018-09-27 MED ORDER — METFORMIN HCL 500 MG PO TABS: 250.0000 mg | ORAL_TABLET | Freq: Every day | ORAL | 3 refills | Status: DC

## 2018-09-27 MED ORDER — SIMVASTATIN 20 MG PO TABS: 20.0000 mg | ORAL_TABLET | Freq: Every day | ORAL | 3 refills | Status: DC

## 2018-09-27 MED FILL — SIMVASTATIN 20 MG TABLET: 20 | 30 days supply | Qty: 30 | Fill #0

## 2018-09-27 MED FILL — metFORMIN HCL 500 MG TABS: 500 | 30 days supply | Qty: 15 | Fill #0

## 2018-09-27 NOTE — Telephone Encounter (Signed)
PA approved. Effective 09/23/2018 to 01/26/2019.

## 2018-09-28 MED FILL — SAXENDA 18 MG/3 ML PEN: 18 | 30 days supply | Qty: 15 | Fill #0

## 2018-09-28 MED FILL — ULTICARE PEN NDL 8MM 31G: 31G X 8 MM | 30 days supply | Qty: 100 | Fill #0

## 2018-10-04 ENCOUNTER — Ambulatory Visit (HOSPITAL_BASED_OUTPATIENT_CLINIC_OR_DEPARTMENT_OTHER): Payer: BLUE CROSS/BLUE SHIELD

## 2018-10-18 ENCOUNTER — Inpatient Hospital Stay (HOSPITAL_BASED_OUTPATIENT_CLINIC_OR_DEPARTMENT_OTHER): Admission: RE | Admit: 2018-10-18 | Payer: BLUE CROSS/BLUE SHIELD | Source: Ambulatory Visit

## 2018-12-26 MED FILL — ALPRAZolam 0.5 MG TABS: 0.5 | 30 days supply | Qty: 30 | Fill #1

## 2018-12-26 MED FILL — FLUoxetine HCL 20 MG TABS: 20 | 30 days supply | Qty: 60 | Fill #1

## 2019-03-08 ENCOUNTER — Other Ambulatory Visit: Payer: Self-pay

## 2019-03-08 ENCOUNTER — Encounter: Payer: Self-pay | Admitting: Family Medicine

## 2019-03-08 ENCOUNTER — Ambulatory Visit (INDEPENDENT_AMBULATORY_CARE_PROVIDER_SITE_OTHER): Payer: BC Managed Care – PPO | Admitting: Family Medicine

## 2019-03-08 DIAGNOSIS — M545 Low back pain, unspecified: Secondary | ICD-10-CM

## 2019-03-08 MED ORDER — HYDROCODONE-ACETAMINOPHEN 5-325 MG PO TABS: 1.0000 | ORAL_TABLET | Freq: Four times a day (QID) | ORAL | 0 refills | Status: AC | PRN

## 2019-03-08 MED FILL — HYDROCODON-APAP 5-325: 5-325 | 3 days supply | Qty: 15 | Fill #0

## 2019-03-08 NOTE — Progress Notes (Signed)
[place] at [facility] [address] [place], [place] [address] [phone]  Date:  03/08/2019   Name:  Teresa Perez   DOB:  02/01/1976   MRN:  [id number]  PCP:  Darreld Mclean, MD    Chief Complaint: No chief complaint on file.   History of Present Illness:  Teresa Perez is a 43 y.o. very pleasant female patient who presents with the following:  Virtual visit today to discuss back pain Pt location is home, provider is at office Pt ID confirmed with 2 factors- she gives consent for virtual visit today  History of diabetes, depression, obesity Lab Results  Component Value Date   HGBA1C 7.0 (H) 09/22/2018   At our last visit in January we put her on saxenda for obesity and glucose control I also started her on prozac due to stress and depression  Visit today due to back pain  She has been trying to be more active and doing more exercise- she had some muscle soreness for the last couple of weeks which she thought was normal due to exercise  Yesterday she was cleaning the bathroom (she was bent down trying to clean behind her toilet) and had sudden onset of pain in her back Her back hurts in the lower back, more on the right than on the left but does hurt on both sides She rested and took a naproxen- it continued to hurt, she is miserable She also tried some OTC topical muscle rubs but did not help Right now she is just trying to prop herself up in bed and try to keep her back as comfortable as she can  The pain did radiate just a bit into her buttocks, but no numbness or weakness of her legs, no radiation of the pain down her legs No bowel or bladder control loss  No fever or belly pain She is checking her temp - normal  Patient Active Problem List   Diagnosis Date Noted  . Adjustment reaction with anxiety and depression 07/29/2017  . Borderline blood pressure 07/29/2017  . History of gestational diabetes 07/29/2017  .  Diabetes mellitus without complication ([place]) 25/63/8937  . Gestational diabetes mellitus (GDM), antepartum 08/17/2016  . History of conization of cervix affecting pregnancy, antepartum 08/05/2016  . Depression 01/02/2014  . PCOS (polycystic ovarian syndrome) 01/02/2014  . Severe obesity (BMI >= 40) ([place]) 06/22/2013    Past Medical History:  Diagnosis Date  . Acute bacterial bronchitis 11/05/2015  . Anemia   . Anxiety   . Asthma    childhood asthma  . Boil    tendency to form boils  . Depression    has been treated in past-no present meds  . Family history of anesthesia complication 3428   son- age 76 - "had lockjaw" after surgery (T&A)  . Gestational diabetes mellitus (GDM), antepartum 08/17/2016   Fasting on 2 hour was 107  . Headache(784.0)   . Menorrhagia 10/29/2015  . PCOS (polycystic ovarian syndrome)   . Pneumonia    AS A CHILD    Past Surgical History:  Procedure Laterality Date  . CERVICAL CONIZATION W/BX N/A 10/31/2015   Procedure: CONIZATION CERVIX WITH BIOPSY;  Surgeon: Eldred Manges, MD;  Location: [facility];  Service: Gynecology;  Laterality: N/A;  . CESAREAN SECTION     x2 previous  . CESAREAN SECTION  07/03/2011   Procedure: CESAREAN SECTION;  Surgeon: Alwyn Pea, MD;  Location: [facility];  Service: Gynecology;  Laterality: N/A;  . CESAREAN SECTION N/A 06/22/2013   Procedure: CESAREAN SECTION, repeat;  Surgeon: Eldred Manges, MD;  Location: [facility];  Service: Obstetrics;  Laterality: N/A;  . CESAREAN SECTION WITH BILATERAL TUBAL LIGATION Bilateral 02/14/2017   Procedure: CESAREAN SECTION WITH BILATERAL TUBAL LIGATION;  Surgeon: Osborne Oman, MD;  Location: [facility];  Service: Obstetrics;  Laterality: Bilateral;  . IUD REMOVAL N/A 10/31/2015   Procedure: INTRAUTERINE DEVICE (IUD) REMOVAL;  Surgeon: Eldred Manges, MD;  Location: [facility];  Service: Gynecology;  Laterality: N/A;  . MOUTH SURGERY  2004    Social History   Tobacco Use  .  Smoking status: Current Some Day Smoker    Packs/day: 0.25    Years: 16.00    Pack years: 4.00    Types: Cigarettes  . Smokeless tobacco: Never Used  Substance Use Topics  . Alcohol use: No    Comment: occasional   . Drug use: No    Family History  Problem Relation Age of Onset  . Anesthesia problems Son        son-had T&A-2008-had trismus  . Diabetes Mother   . Hypertension Mother   . Hyperlipidemia Mother   . Arthritis Mother   . Anxiety disorder Mother   . Hypertension Father   . Hypertension Paternal Grandmother   . Hyperlipidemia Paternal Grandmother   . Depression Neg Hx     Allergies  Allergen Reactions  . Latex Rash and Itching    Medication list has been reviewed and updated.  Current Outpatient Medications on File Prior to Visit  Medication Sig Dispense Refill  . ALPRAZolam (XANAX) 0.5 MG tablet Take 0.5 tablets (0.25 mg total) by mouth 2 (two) times daily as needed for anxiety. 30 tablet 1  . clobetasol ointment (TEMOVATE) 0.05 % Apply to lesion on leg 2 x a day for 1 week.Cover with plastic wrap.Then use daily only as needed.Do not apply to face or groin.    Marland Kitchen FLUoxetine (PROZAC) 20 MG tablet Take 1 tablet (20 mg total) by mouth daily. Increase to 2 pills after 2 weeks 60 tablet 9  . Liraglutide -Weight Management (SAXENDA) 18 MG/3ML SOPN Inject 0.6 mg into the skin daily. 0.6 mg for one week- may then increase as follows 1.2 mg for one week 1.8 mg for one week 2.4 mg for one week 3 mg is max dose 15 mL 6  . metFORMIN (GLUCOPHAGE) 500 MG tablet Take 0.5 tablets (250 mg total) by mouth daily. 30 tablet 3  . simvastatin (ZOCOR) 20 MG tablet Take 1 tablet (20 mg total) by mouth at bedtime. 90 tablet 3   No current facility-administered medications on file prior to visit.     Review of Systems:  As per HPI- otherwise negative. No fever or chills  Physical Examination: There were no vitals filed for this visit. There were no vitals filed for this  visit. There is no height or weight on file to calculate BMI. Ideal Body Weight:    Pt observed on video - she looks well - no cough, wheezing or distress is noted  Her apple watch does monitor her pulse but not her BP  Assessment and Plan:   ICD-10-CM   1. Acute bilateral low back pain without sciatica  M54.5 HYDROcodone-acetaminophen (NORCO/VICODIN) 5-325 MG tablet     Follow-up: No follow-ups on file.  Meds ordered this encounter  Medications  . HYDROcodone-acetaminophen (NORCO/VICODIN) 5-325 MG tablet  Sig: Take 1 tablet by mouth every 6 (six) hours as needed for up to 5 days.    Dispense:  15 tablet    Refill:  0   No orders of the defined types were placed in this encounter.   @SIGN @  Acute lower back pain- likely a muscle spasm.  Consider a muscle relaxer but she is miserable- will give her a small amount of hydrocodone.  Advised her not to drive with this med and do not combine with xanax  She will let me know if her back is not significantly better by next week- in that case will have her come in for eval     Signed Lamar Blinks, MD

## 2019-06-07 ENCOUNTER — Other Ambulatory Visit: Payer: Self-pay | Admitting: Family Medicine

## 2019-06-07 DIAGNOSIS — F410 Panic disorder [episodic paroxysmal anxiety] without agoraphobia: Secondary | ICD-10-CM

## 2019-06-07 MED FILL — metFORMIN HCL 500 MG TABS: 500 | 30 days supply | Qty: 15 | Fill #1

## 2019-06-07 MED FILL — FLUOXETINE HCL 20 MG TABS: 20 | 30 days supply | Qty: 60 | Fill #2

## 2019-06-07 MED FILL — SIMVASTATIN 20 MG TABLET: 20 | 30 days supply | Qty: 30 | Fill #1

## 2019-06-07 NOTE — Telephone Encounter (Signed)
Requesting:Aprazolam Contract:none, needs csc BXU:XYBF, needs uds Last Visit:03/08/2019 Next Visit:none scheduled Last Refill:09/22/2018  Please Advise

## 2019-06-08 MED FILL — ALPRAZolam 0.5 MG TABS: 0.5 | 30 days supply | Qty: 30 | Fill #0

## 2019-11-21 DIAGNOSIS — Z20828 Contact with and (suspected) exposure to other viral communicable diseases: Secondary | ICD-10-CM | POA: Diagnosis not present

## 2020-02-01 ENCOUNTER — Encounter: Payer: Self-pay | Admitting: Family Medicine

## 2020-02-01 ENCOUNTER — Other Ambulatory Visit: Payer: Self-pay

## 2020-02-01 ENCOUNTER — Ambulatory Visit (INDEPENDENT_AMBULATORY_CARE_PROVIDER_SITE_OTHER): Payer: BC Managed Care – PPO | Admitting: Family Medicine

## 2020-02-01 VITALS — BP 126/84 | HR 85 | Temp 97.6°F | Resp 17 | Ht 66.5 in | Wt 272.0 lb

## 2020-02-01 DIAGNOSIS — Z130 Encounter for screening for diseases of the blood and blood-forming organs and certain disorders involving the immune mechanism: Secondary | ICD-10-CM | POA: Diagnosis not present

## 2020-02-01 DIAGNOSIS — E785 Hyperlipidemia, unspecified: Secondary | ICD-10-CM | POA: Diagnosis not present

## 2020-02-01 DIAGNOSIS — E119 Type 2 diabetes mellitus without complications: Secondary | ICD-10-CM

## 2020-02-01 DIAGNOSIS — Z0000 Encounter for general adult medical examination without abnormal findings: Secondary | ICD-10-CM

## 2020-02-01 DIAGNOSIS — Z1329 Encounter for screening for other suspected endocrine disorder: Secondary | ICD-10-CM

## 2020-02-01 DIAGNOSIS — Z1231 Encounter for screening mammogram for malignant neoplasm of breast: Secondary | ICD-10-CM

## 2020-02-01 DIAGNOSIS — D509 Iron deficiency anemia, unspecified: Secondary | ICD-10-CM

## 2020-02-01 LAB — CBC
HCT: 33.8 % — ABNORMAL LOW (ref 36.0–46.0)
Hemoglobin: 10.4 g/dL — ABNORMAL LOW (ref 12.0–15.0)
MCHC: 30.9 g/dL (ref 30.0–36.0)
MCV: 68.5 fl — ABNORMAL LOW (ref 78.0–100.0)
Platelets: 358 10*3/uL (ref 150.0–400.0)
RBC: 4.94 Mil/uL (ref 3.87–5.11)
RDW: 16.4 % — ABNORMAL HIGH (ref 11.5–15.5)
WBC: 10.2 10*3/uL (ref 4.0–10.5)

## 2020-02-01 LAB — COMPREHENSIVE METABOLIC PANEL
ALT: 16 U/L (ref 0–35)
AST: 14 U/L (ref 0–37)
Albumin: 3.7 g/dL (ref 3.5–5.2)
Alkaline Phosphatase: 67 U/L (ref 39–117)
BUN: 9 mg/dL (ref 6–23)
CO2: 24 mEq/L (ref 19–32)
Calcium: 9 mg/dL (ref 8.4–10.5)
Chloride: 104 mEq/L (ref 96–112)
Creatinine, Ser: 0.68 mg/dL (ref 0.40–1.20)
GFR: 113.4 mL/min (ref >60.00)
Glucose, Bld: 148 mg/dL — ABNORMAL HIGH (ref 70–99)
Potassium: 3.8 mEq/L (ref 3.5–5.1)
Sodium: 136 mEq/L (ref 135–145)
Total Bilirubin: 0.4 mg/dL (ref 0.2–1.2)
Total Protein: 6.6 g/dL (ref 6.0–8.3)

## 2020-02-01 LAB — TSH: TSH: 2.03 u[IU]/mL (ref 0.35–4.50)

## 2020-02-01 LAB — LIPID PANEL
Cholesterol: 161 mg/dL (ref 0–200)
HDL: 36 mg/dL — ABNORMAL LOW (ref >39.00)
LDL Cholesterol: 102 mg/dL — ABNORMAL HIGH (ref 0–99)
NonHDL: 124.68
Total CHOL/HDL Ratio: 4
Triglycerides: 113 mg/dL (ref 0.0–149.0)
VLDL: 22.6 mg/dL (ref 0.0–40.0)

## 2020-02-01 LAB — MICROALBUMIN / CREATININE URINE RATIO
Creatinine,U: 146 mg/dL
Microalb Creat Ratio: 0.8 mg/g (ref 0.0–30.0)
Microalb, Ur: 1.2 mg/dL (ref 0.0–1.9)

## 2020-02-01 LAB — HEMOGLOBIN A1C: Hgb A1c MFr Bld: 7.6 % — ABNORMAL HIGH (ref 4.6–6.5)

## 2020-02-01 NOTE — Patient Instructions (Signed)
It was good to see you again today, I will be in touch with your labs as soon as possible We will see if you need to go back on medication for diabetes and/ or cholesterol  I would encourage you to get your covid 19 vaccines asap!  They are safe and effective Mammogram ordered for you today    Health Maintenance, Female Adopting a healthy lifestyle and getting preventive care are important in promoting health and wellness. Ask your health care provider about:  The right schedule for you to have regular tests and exams.  Things you can do on your own to prevent diseases and keep yourself healthy. What should I know about diet, weight, and exercise? Eat a healthy diet   Eat a diet that includes plenty of vegetables, fruits, low-fat dairy products, and lean protein.  Do not eat a lot of foods that are high in solid fats, added sugars, or sodium. Maintain a healthy weight Body mass index (BMI) is used to identify weight problems. It estimates body fat based on height and weight. Your health care provider can help determine your BMI and help you achieve or maintain a healthy weight. Get regular exercise Get regular exercise. This is one of the most important things you can do for your health. Most adults should:  Exercise for at least 150 minutes each week. The exercise should increase your heart rate and make you sweat (moderate-intensity exercise).  Do strengthening exercises at least twice a week. This is in addition to the moderate-intensity exercise.  Spend less time sitting. Even light physical activity can be beneficial. Watch cholesterol and blood lipids Have your blood tested for lipids and cholesterol at 45 years of age, then have this test every 5 years. Have your cholesterol levels checked more often if:  Your lipid or cholesterol levels are high.  You are older than 45 years of age.  You are at high risk for heart disease. What should I know about cancer  screening? Depending on your health history and family history, you may need to have cancer screening at various ages. This may include screening for:  Breast cancer.  Cervical cancer.  Colorectal cancer.  Skin cancer.  Lung cancer. What should I know about heart disease, diabetes, and high blood pressure? Blood pressure and heart disease  High blood pressure causes heart disease and increases the risk of stroke. This is more likely to develop in people who have high blood pressure readings, are of African descent, or are overweight.  Have your blood pressure checked: ? Every 3-5 years if you are 52-29 years of age. ? Every year if you are 57 years old or older. Diabetes Have regular diabetes screenings. This checks your fasting blood sugar level. Have the screening done:  Once every three years after age 3 if you are at a normal weight and have a low risk for diabetes.  More often and at a younger age if you are overweight or have a high risk for diabetes. What should I know about preventing infection? Hepatitis B If you have a higher risk for hepatitis B, you should be screened for this virus. Talk with your health care provider to find out if you are at risk for hepatitis B infection. Hepatitis C Testing is recommended for:  Everyone born from [birth year] through 1966.  Anyone with known risk factors for hepatitis C. Sexually transmitted infections (STIs)  Get screened for STIs, including gonorrhea and chlamydia, if: ? You are sexually  active and are younger than 45 years of age. ? You are older than 45 years of age and your health care provider tells you that you are at risk for this type of infection. ? Your sexual activity has changed since you were last screened, and you are at increased risk for chlamydia or gonorrhea. Ask your health care provider if you are at risk.  Ask your health care provider about whether you are at high risk for HIV. Your health care provider may  recommend a prescription medicine to help prevent HIV infection. If you choose to take medicine to prevent HIV, you should first get tested for HIV. You should then be tested every 3 months for as long as you are taking the medicine. Pregnancy  If you are about to stop having your period (premenopausal) and you may become pregnant, seek counseling before you get pregnant.  Take 400 to 800 micrograms (mcg) of folic acid every day if you become pregnant.  Ask for birth control (contraception) if you want to prevent pregnancy. Osteoporosis and menopause Osteoporosis is a disease in which the bones lose minerals and strength with aging. This can result in bone fractures. If you are 25 years old or older, or if you are at risk for osteoporosis and fractures, ask your health care provider if you should:  Be screened for bone loss.  Take a calcium or vitamin D supplement to lower your risk of fractures.  Be given hormone replacement therapy (HRT) to treat symptoms of menopause. Follow these instructions at home: Lifestyle  Do not use any products that contain nicotine or tobacco, such as cigarettes, e-cigarettes, and chewing tobacco. If you need help quitting, ask your health care provider.  Do not use street drugs.  Do not share needles.  Ask your health care provider for help if you need support or information about quitting drugs. Alcohol use  Do not drink alcohol if: ? Your health care provider tells you not to drink. ? You are pregnant, may be pregnant, or are planning to become pregnant.  If you drink alcohol: ? Limit how much you use to 0-1 drink a day. ? Limit intake if you are breastfeeding.  Be aware of how much alcohol is in your drink. In the [place] one drink equals one 12 oz bottle of beer (355 mL), one 5 oz glass of wine (148 mL), or one 1 oz glass of hard liquor (44 mL). General instructions  Schedule regular health, dental, and eye exams.  Stay current with your  vaccines.  Tell your health care provider if: ? You often feel depressed. ? You have ever been abused or do not feel safe at home. Summary  Adopting a healthy lifestyle and getting preventive care are important in promoting health and wellness.  Follow your health care provider's instructions about healthy diet, exercising, and getting tested or screened for diseases.  Follow your health care provider's instructions on monitoring your cholesterol and blood pressure. This information is not intended to replace advice given to you by your health care provider. Make sure you discuss any questions you have with your health care provider. Document Revised: 08/10/2018 Document Reviewed: 08/10/2018 Elsevier Patient Education  2020 [employer].

## 2020-02-01 NOTE — Progress Notes (Addendum)
[place] at [facility] [address] [place], [place] [address] [phone]  Date:  02/01/2020   Name:  Teresa Perez   DOB:  November 04, 1975   MRN:  [id number]  PCP:  Darreld Mclean, MD    Chief Complaint: Annual Exam   History of Present Illness:  Teresa Perez is a 44 y.o. very pleasant female patient who presents with the following:  Patient with history of obesity, diabetes, depression, PCOS.  Here today for physical exam  Last seen by myself about 18 months ago.  At that time she was having an increase in her stress and panic attacks.  She had broken up with her long-term boyfriend, father of 4 of her 6 children Her 2 older kids are 79 and 46, she also has 4 school-aged children between the ages of 54 and 59 We also did a virtual visit in July 2020 due to back pain She has been working from home during the pandemic.  She gained some weight and then did a "smoothie cleanse" and got back to her baseline she thinks   Foot exam due Eye exam; done last October  Labs and A1c are due- she is not fasting  Microalbumin due COVID-19 series- encouraged her to have this done asap, not done yet  Pneumonia vaccine Pap per GYN, Dr. Nehemiah Settle Mammogram- needs to have this arranged for her   Tobacco quit smoking a year or so go  Alcohol rare except for vacations  She ran out/ stopped all her medications  She feels like she is ok without prozac right now and does not wish to restart She has some xanax left, does not need a refill   Patient Active Problem List   Diagnosis Date Noted  . Adjustment reaction with anxiety and depression 07/29/2017  . Borderline blood pressure 07/29/2017  . Diabetes mellitus without complication ([place]) 80/16/5537  . Gestational diabetes mellitus (GDM), antepartum 08/17/2016  . History of conization of cervix affecting pregnancy, antepartum 08/05/2016  . Depression 01/02/2014  . PCOS (polycystic ovarian syndrome)  01/02/2014  . Severe obesity (BMI >= 40) ([place]) 06/22/2013    Past Medical History:  Diagnosis Date  . Acute bacterial bronchitis 11/05/2015  . Anemia   . Anxiety   . Asthma    childhood asthma  . Boil    tendency to form boils  . Depression    has been treated in past-no present meds  . Family history of anesthesia complication 4827   son- age 45 - "had lockjaw" after surgery (T&A)  . Gestational diabetes mellitus (GDM), antepartum 08/17/2016   Fasting on 2 hour was 107  . Headache(784.0)   . Menorrhagia 10/29/2015  . PCOS (polycystic ovarian syndrome)   . Pneumonia    AS A CHILD    Past Surgical History:  Procedure Laterality Date  . CERVICAL CONIZATION W/BX N/A 10/31/2015   Procedure: CONIZATION CERVIX WITH BIOPSY;  Surgeon: Eldred Manges, MD;  Location: [facility];  Service: Gynecology;  Laterality: N/A;  . CESAREAN SECTION     x2 previous  . CESAREAN SECTION  07/03/2011   Procedure: CESAREAN SECTION;  Surgeon: Alwyn Pea, MD;  Location: [facility];  Service: Gynecology;  Laterality: N/A;  . CESAREAN SECTION N/A 06/22/2013   Procedure: CESAREAN SECTION, repeat;  Surgeon: Eldred Manges, MD;  Location: [facility];  Service: Obstetrics;  Laterality: N/A;  . CESAREAN SECTION WITH BILATERAL TUBAL LIGATION Bilateral 02/14/2017  Procedure: CESAREAN SECTION WITH BILATERAL TUBAL LIGATION;  Surgeon: Osborne Oman, MD;  Location: [facility];  Service: Obstetrics;  Laterality: Bilateral;  . IUD REMOVAL N/A 10/31/2015   Procedure: INTRAUTERINE DEVICE (IUD) REMOVAL;  Surgeon: Eldred Manges, MD;  Location: [facility];  Service: Gynecology;  Laterality: N/A;  . MOUTH SURGERY  2004    Social History   Tobacco Use  . Smoking status: Current Some Day Smoker    Packs/day: 0.25    Years: 16.00    Pack years: 4.00    Types: Cigarettes  . Smokeless tobacco: Never Used  Substance Use Topics  . Alcohol use: No    Comment: occasional   . Drug use: No    Family History  Problem  Relation Age of Onset  . Anesthesia problems Son        son-had T&A-2008-had trismus  . Diabetes Mother   . Hypertension Mother   . Hyperlipidemia Mother   . Arthritis Mother   . Anxiety disorder Mother   . Hypertension Father   . Hypertension Paternal Grandmother   . Hyperlipidemia Paternal Grandmother   . Depression Neg Hx     Allergies  Allergen Reactions  . Latex Rash and Itching    Medication list has been reviewed and updated.  Current Outpatient Medications on File Prior to Visit  Medication Sig Dispense Refill  . ALPRAZolam (XANAX) 0.5 MG tablet TAKE 1/2 TABLETS BY MOUTH 2 TIMES A DAY AS NEEDED FOR ANXIETY 30 tablet 1  . FLUoxetine (PROZAC) 20 MG tablet Take 1 tablet (20 mg total) by mouth daily. Increase to 2 pills after 2 weeks (Patient not taking: Reported on 02/01/2020) 60 tablet 9  . metFORMIN (GLUCOPHAGE) 500 MG tablet Take 0.5 tablets (250 mg total) by mouth daily. (Patient not taking: Reported on 02/01/2020) 30 tablet 3  . simvastatin (ZOCOR) 20 MG tablet Take 1 tablet (20 mg total) by mouth at bedtime. (Patient not taking: Reported on 02/01/2020) 90 tablet 3   No current facility-administered medications on file prior to visit.    Review of Systems:  As per HPI- otherwise negative.   Physical Examination: Vitals:   02/01/20 1120  BP: 126/84  Pulse: 85  Resp: 17  Temp: 97.6 F (36.4 C)  SpO2: 98%   Vitals:   02/01/20 1120  Weight: 272 lb (123.4 kg)  Height: 5' 6.5" (1.689 m)   Body mass index is 43.24 kg/m. Ideal Body Weight: Weight in (lb) to have BMI = 25: 156.9  GEN: no acute distress.  Obese, looks well  HEENT: Atraumatic, Normocephalic.  Bilateral TM wnl, oropharynx normal.  PEERL,EOMI.    Ears and Nose: No external deformity. CV: RRR, No M/G/R. No JVD. No thrill. No extra heart sounds. PULM: CTA B, no wheezes, crackles, rhonchi. No retractions. No resp. distress. No accessory muscle use. ABD: S, NT, ND, +BS. No rebound. No HSM. EXTR: No  c/c/e PSYCH: Normally interactive. Conversant.  Foot exam normal  Assessment and Plan: Physical exam  Dyslipidemia - Plan: Lipid panel  Controlled type 2 diabetes mellitus without complication, without long-term current use of insulin (HCC) - Plan: Comprehensive metabolic panel, Hemoglobin A1c, Microalbumin / creatinine urine ratio  Morbid obesity (HCC)  Screening for deficiency anemia - Plan: CBC  Screening for thyroid disorder - Plan: TSH  Encounter for screening mammogram for malignant neoplasm of breast - Plan: MM 3D SCREEN BREAST BILATERAL  Here today for routine physical.  Patient is no longer taking her diabetes  or cholesterol medication.  We will check her labs today and get in touch with her about next steps Labs are pending as above Ordered mammogram Encourage COVID-19 vaccine Will plan further follow- up pending labs.  Discussed diet, exercise This visit occurred during the SARS-CoV-2 public health emergency.  Safety protocols were in place, including screening questions prior to the visit, additional usage of staff PPE, and extensive cleaning of exam room while observing appropriate contact time as indicated for disinfecting solutions.    Signed Lamar Blinks, MD  6/4- received her labs as below, message to pt  Results for orders placed or performed in visit on 02/01/20  CBC  Result Value Ref Range   WBC 10.2 4.0 - 10.5 K/uL   RBC 4.94 3.87 - 5.11 Mil/uL   Platelets 358.0 150.0 - 400.0 K/uL   Hemoglobin 10.4 (L) 12.0 - 15.0 g/dL   HCT 33.8 (L) 36.0 - 46.0 %   MCV 68.5 Repeated and verified X2. (L) 78.0 - 100.0 fl   MCHC 30.9 30.0 - 36.0 g/dL   RDW 16.4 (H) 11.5 - 15.5 %  Comprehensive metabolic panel  Result Value Ref Range   Sodium 136 135 - 145 mEq/L   Potassium 3.8 3.5 - 5.1 mEq/L   Chloride 104 96 - 112 mEq/L   CO2 24 19 - 32 mEq/L   Glucose, Bld 148 (H) 70 - 99 mg/dL   BUN 9 6 - 23 mg/dL   Creatinine, Ser 0.68 0.40 - 1.20 mg/dL   Total  Bilirubin 0.4 0.2 - 1.2 mg/dL   Alkaline Phosphatase 67 39 - 117 U/L   AST 14 0 - 37 U/L   ALT 16 0 - 35 U/L   Total Protein 6.6 6.0 - 8.3 g/dL   Albumin 3.7 3.5 - 5.2 g/dL   GFR 113.40 >60.00 mL/min   Calcium 9.0 8.4 - 10.5 mg/dL  Hemoglobin A1c  Result Value Ref Range   Hgb A1c MFr Bld 7.6 (H) 4.6 - 6.5 %  Lipid panel  Result Value Ref Range   Cholesterol 161 0 - 200 mg/dL   Triglycerides 113.0 0.0 - 149.0 mg/dL   HDL 36.00 (L) >39.00 mg/dL   VLDL 22.6 0.0 - 40.0 mg/dL   LDL Cholesterol 102 (H) 0 - 99 mg/dL   Total CHOL/HDL Ratio 4    NonHDL 124.68   TSH  Result Value Ref Range   TSH 2.03 0.35 - 4.50 uIU/mL  Microalbumin / creatinine urine ratio  Result Value Ref Range   Microalb, Ur 1.2 0.0 - 1.9 mg/dL   Creatinine,U 146.0 mg/dL   Microalb Creat Ratio 0.8 0.0 - 30.0 mg/g

## 2020-02-02 ENCOUNTER — Encounter: Payer: Self-pay | Admitting: Family Medicine

## 2020-02-02 DIAGNOSIS — F410 Panic disorder [episodic paroxysmal anxiety] without agoraphobia: Secondary | ICD-10-CM

## 2020-02-02 MED ORDER — SIMVASTATIN 20 MG PO TABS: 20.0000 mg | ORAL_TABLET | Freq: Every day | ORAL | 3 refills | Status: DC

## 2020-02-02 MED ORDER — FERROUS FUMARATE 325 (106 FE) MG PO TABS: 1.0000 | ORAL_TABLET | Freq: Every day | ORAL | 2 refills | Status: DC

## 2020-02-02 MED ORDER — METFORMIN HCL 500 MG PO TABS: 250.0000 mg | ORAL_TABLET | Freq: Every day | ORAL | 3 refills | Status: DC

## 2020-02-02 MED FILL — METFORMIN HCL 500 MG TABS: 500 | 90 days supply | Qty: 45 | Fill #0

## 2020-02-02 MED FILL — FERRETTS 325 MG TABLET: 325 (106 FE | 30 days supply | Qty: 30 | Fill #0

## 2020-02-02 MED FILL — SIMVASTATIN 20 MG TABLET: 20 | 90 days supply | Qty: 90 | Fill #0

## 2020-02-02 NOTE — Addendum Note (Signed)
Addended by: Lamar Blinks C on: 02/02/2020 06:55 AM   Modules accepted: Orders

## 2020-02-05 MED ORDER — ALPRAZOLAM 0.5 MG PO TABS: ORAL_TABLET | ORAL | 1 refills | Status: DC

## 2020-02-29 ENCOUNTER — Ambulatory Visit: Payer: BC Managed Care – PPO | Admitting: Family Medicine

## 2020-04-04 ENCOUNTER — Encounter: Payer: Self-pay | Admitting: Family Medicine

## 2020-04-04 ENCOUNTER — Other Ambulatory Visit (HOSPITAL_COMMUNITY)
Admission: RE | Admit: 2020-04-04 | Discharge: 2020-04-04 | Disposition: A | Discharge status: Home / Self Care | Payer: BC Managed Care – PPO | Source: Ambulatory Visit | Attending: Family Medicine | Admitting: Family Medicine

## 2020-04-04 ENCOUNTER — Other Ambulatory Visit: Payer: Self-pay

## 2020-04-04 ENCOUNTER — Ambulatory Visit (INDEPENDENT_AMBULATORY_CARE_PROVIDER_SITE_OTHER): Payer: BC Managed Care – PPO | Admitting: Family Medicine

## 2020-04-04 VITALS — BP 128/87 | HR 82 | Ht 66.5 in | Wt 279.0 lb

## 2020-04-04 DIAGNOSIS — Z6841 Body Mass Index (BMI) 40.0 and over, adult: Secondary | ICD-10-CM

## 2020-04-04 DIAGNOSIS — Z01419 Encounter for gynecological examination (general) (routine) without abnormal findings: Secondary | ICD-10-CM | POA: Diagnosis not present

## 2020-04-04 MED ORDER — FLUCONAZOLE 150 MG PO TABS: 150.0000 mg | ORAL_TABLET | Freq: Once | ORAL | 3 refills | Status: AC

## 2020-04-04 MED FILL — FLUCONAZOLE 150 MG TABS: 150 | 1 days supply | Qty: 1 | Fill #0

## 2020-04-04 MED FILL — ALPRAZolam 0.5 MG TABS: 0.5 | 30 days supply | Qty: 30 | Fill #1

## 2020-04-04 NOTE — Progress Notes (Signed)
GYNECOLOGY ANNUAL PREVENTATIVE CARE ENCOUNTER NOTE  Subjective:   Teresa Perez is a 44 y.o. [phone] female here for a routine annual gynecologic exam.  Current complaints: weight gain.   Denies abnormal vaginal bleeding, discharge, pelvic pain, problems with intercourse or other gynecologic concerns.    Gynecologic History Patient's last menstrual period was 03/17/2020 (exact date). Patient is not sexually active  Contraception: tubal ligation Last Pap: 2020. Results were: normal Last mammogram: N/A.  Obstetric History OB History  Gravida Para Term Preterm AB Living  6 6 6  0 0 6  SAB TAB Ectopic Multiple Live Births  0 0 0 0 6    # Outcome Date GA Lbr Len/2nd Weight Sex Delivery Anes PTL Lv  6 Term 02/14/17 [redacted]w[redacted]d 8 lb 4.1 oz (3.745 kg) M CS-LTranv Spinal  LIV  5 Term 06/22/13 [id number] lb 8.1 oz (2.95 kg) F CS-LTranv EPI  LIV  4 Term 07/03/11 4030w0d lb 15.3 oz (3.155 kg) F CS-LTranv Spinal  LIV     Birth Comments: normal AGA term  3 Term 04/2011    F CS-Unspec Gen  LIV     Birth Comments: fetal distress, placenta abruptio  2 Term 07/1998   8 lb 1 oz (3.657 kg) M Vag-Spont   LIV  1 Term 04/1995   8 lb 8 oz (3.856 kg) M CS-Unspec EPI  LIV    Past Medical History:  Diagnosis Date  . Acute bacterial bronchitis 11/05/2015  . Anemia   . Anxiety   . Asthma    childhood asthma  . Boil    tendency to form boils  . Depression    has been treated in past-no present meds  . Family history of anesthesia complication [id number]- age 57 -25"had lockjaw" after surgery (T&A)  . Gestational diabetes mellitus (GDM), antepartum 08/17/2016   Fasting on 2 hour was 107  . Headache(784.0)   . Menorrhagia 10/29/2015  . PCOS (polycystic ovarian syndrome)   . Pneumonia    AS A CHILD    Past Surgical History:  Procedure Laterality Date  . CERVICAL CONIZATION W/BX N/A 10/31/2015   Procedure: CONIZATION CERVIX WITH BIOPSY;  Surgeon: VanEldred MangesD;  [facility];  Service:  Gynecology;  Laterality: N/A;  . CESAREAN SECTION     x2 previous  . CESAREAN SECTION  07/03/2011   Procedure: CESAREAN SECTION;  Surgeon: SanAlwyn PeaD;  [facility];  Service: Gynecology;  Laterality: N/A;  . CESAREAN SECTION N/A 06/22/2013   Procedure: CESAREAN SECTION, repeat;  Surgeon: VanEldred MangesD;  [facility];  Service: Obstetrics;  Laterality: N/A;  . CESAREAN SECTION WITH BILATERAL TUBAL LIGATION Bilateral 02/14/2017   Procedure: CESAREAN SECTION WITH BILATERAL TUBAL LIGATION;  Surgeon: AnyOsborne OmanD;  [facility];  Laterality: Bilateral;  . IUD REMOVAL N/A 10/31/2015   Procedure: INTRAUTERINE DEVICE (IUD) REMOVAL;  Surgeon: VanEldred MangesD;  [facility];  Service: Gynecology;  Laterality: N/A;  . MOUTH SURGERY  2004    Current Outpatient Medications on File Prior to Visit  Medication Sig Dispense Refill  . ALPRAZolam (XANAX) 0.5 MG tablet TAKE 1/2 TABLETS BY MOUTH 2 TIMES A DAY AS NEEDED FOR ANXIETY 30 tablet 1  . ferrous fumarate (HEMOCYTE - 106 MG FE) 325 (106 Fe) MG TABS tablet Take 1 tablet (106 mg of iron total) by mouth daily. 30 tablet 2  .  ibuprofen (ADVIL) 200 MG tablet Take 200 mg by mouth every 6 (six) hours as needed.    . metFORMIN (GLUCOPHAGE) 500 MG tablet Take 0.5 tablets (250 mg total) by mouth daily. 90 tablet 3  . simvastatin (ZOCOR) 20 MG tablet Take 1 tablet (20 mg total) by mouth at bedtime. 90 tablet 3   No current facility-administered medications on file prior to visit.    Allergies  Allergen Reactions  . Latex Rash and Itching    Social History   Socioeconomic History  . Marital status: Single    Spouse name: Not on file  . Number of children: Not on file  . Years of education: Not on file  . Highest education level: Not on file  Occupational History  . Not on file  Tobacco Use  . Smoking status: Current Some Day Smoker    Packs/day: 0.25    Years: 16.00     Pack years: 4.00    Types: Cigarettes  . Smokeless tobacco: Never Used  Substance and Sexual Activity  . Alcohol use: No    Comment: occasional   . Drug use: No  . Sexual activity: Yes    Birth control/protection: None  Other Topics Concern  . Not on file  Social History Narrative  . Not on file   Social Determinants of Health   Financial Resource Strain:   . Difficulty of Paying Living Expenses:   Food Insecurity:   . Worried About Charity fundraiser in the Last Year:   . Arboriculturist in the Last Year:   Transportation Needs:   . Film/video editor (Medical):   Marland Kitchen Lack of Transportation (Non-Medical):   Physical Activity:   . Days of Exercise per Week:   . Minutes of Exercise per Session:   Stress:   . Feeling of Stress :   Social Connections:   . Frequency of Communication with Friends and Family:   . Frequency of Social Gatherings with Friends and Family:   . Attends Religious Services:   . Active Member of Clubs or Organizations:   . Attends Archivist Meetings:   Marland Kitchen Marital Status:   Intimate Partner Violence:   . Fear of Current or Ex-Partner:   . Emotionally Abused:   Marland Kitchen Physically Abused:   . Sexually Abused:     Family History  Problem Relation Age of Onset  . Anesthesia problems Son        son-had T&A-2008-had trismus  . Diabetes Mother   . Hypertension Mother   . Hyperlipidemia Mother   . Arthritis Mother   . Anxiety disorder Mother   . Hypertension Father   . Hypertension Paternal Grandmother   . Hyperlipidemia Paternal Grandmother   . Depression Neg Hx     The following portions of the patient's history were reviewed and updated as appropriate: allergies, current medications, past family history, past medical history, past social history, past surgical history and problem list.  Review of Systems Pertinent items are noted in HPI.   Objective:  BP 128/87   Pulse 82   Ht 5' 6.5" (1.689 m)   Wt 279 lb (126.6 kg)   LMP  03/17/2020 (Exact Date)   BMI 44.36 kg/m  Wt Readings from Last 3 Encounters:  04/04/20 279 lb (126.6 kg)  02/01/20 272 lb (123.4 kg)  09/22/18 280 lb (127 kg)     Chaperone present during exam  CONSTITUTIONAL: Well-developed, well-nourished female in no acute distress.  HENT:  Normocephalic, atraumatic, External right and left ear normal. Oropharynx is clear and moist EYES: Conjunctivae and EOM are normal. Pupils are equal, round, and reactive to light. No scleral icterus.  NECK: Normal range of motion, supple, no masses.  Normal thyroid.   CARDIOVASCULAR: Normal heart rate noted, regular rhythm RESPIRATORY: Clear to auscultation bilaterally. Effort and breath sounds normal, no problems with respiration noted. BREASTS: Symmetric in size. No masses, skin changes, nipple drainage, or lymphadenopathy. ABDOMEN: Soft, normal bowel sounds, no distention noted.  No tenderness, rebound or guarding.  PELVIC: Normal appearing external genitalia; normal appearing vaginal mucosa and cervix.  No abnormal discharge noted.  Normal uterine size, no other palpable masses, no uterine or adnexal tenderness. MUSCULOSKELETAL: Normal range of motion. No tenderness.  No cyanosis, clubbing, or edema.  2+ distal pulses. SKIN: Skin is warm and dry. No rash noted. Not diaphoretic. No erythema. No pallor. NEUROLOGIC: Alert and oriented to person, place, and time. Normal reflexes, muscle tone coordination. No cranial nerve deficit noted. PSYCHIATRIC: Normal mood and affect. Normal behavior. Normal judgment and thought content.  Assessment:  Annual gynecologic examination with pap smear   Plan:  1. Well Woman Exam Will follow up results of pap smear and manage accordingly. Mammogram scheduled - Cytology - PAP( [place]) - MM DIGITAL SCREENING BILATERAL; Future  2. Body mass index (BMI) of 40.1-44.9 in adult [facility]) Referred to [facility].   Routine preventative health maintenance  measures emphasized. Please refer to After Visit Summary for other counseling recommendations.    Loma Boston, [place] for [employer]

## 2020-04-05 LAB — CYTOLOGY - PAP
Adequacy: ABSENT
Comment: NEGATIVE
Diagnosis: NEGATIVE
High risk HPV: NEGATIVE

## 2020-04-12 ENCOUNTER — Other Ambulatory Visit (HOSPITAL_BASED_OUTPATIENT_CLINIC_OR_DEPARTMENT_OTHER): Payer: Self-pay | Admitting: Family Medicine

## 2020-04-12 DIAGNOSIS — Z01419 Encounter for gynecological examination (general) (routine) without abnormal findings: Secondary | ICD-10-CM

## 2020-04-15 ENCOUNTER — Ambulatory Visit (HOSPITAL_BASED_OUTPATIENT_CLINIC_OR_DEPARTMENT_OTHER)
Admission: RE | Admit: 2020-04-15 | Discharge: 2020-04-15 | Disposition: A | Discharge status: Home / Self Care | Payer: BC Managed Care – PPO | Source: Ambulatory Visit | Attending: Family Medicine | Admitting: Family Medicine

## 2020-04-15 ENCOUNTER — Encounter (HOSPITAL_BASED_OUTPATIENT_CLINIC_OR_DEPARTMENT_OTHER): Payer: Self-pay

## 2020-04-15 ENCOUNTER — Other Ambulatory Visit: Payer: Self-pay

## 2020-04-15 DIAGNOSIS — Z1231 Encounter for screening mammogram for malignant neoplasm of breast: Secondary | ICD-10-CM | POA: Insufficient documentation

## 2020-04-15 DIAGNOSIS — Z01419 Encounter for gynecological examination (general) (routine) without abnormal findings: Secondary | ICD-10-CM

## 2020-05-04 ENCOUNTER — Encounter (INDEPENDENT_AMBULATORY_CARE_PROVIDER_SITE_OTHER): Payer: Self-pay

## 2020-05-09 ENCOUNTER — Encounter (INDEPENDENT_AMBULATORY_CARE_PROVIDER_SITE_OTHER): Payer: Self-pay

## 2020-05-23 ENCOUNTER — Encounter (INDEPENDENT_AMBULATORY_CARE_PROVIDER_SITE_OTHER): Payer: Self-pay | Admitting: Family Medicine

## 2020-05-23 ENCOUNTER — Ambulatory Visit (INDEPENDENT_AMBULATORY_CARE_PROVIDER_SITE_OTHER): Payer: BC Managed Care – PPO | Admitting: Family Medicine

## 2020-05-23 ENCOUNTER — Other Ambulatory Visit: Payer: Self-pay

## 2020-05-23 VITALS — BP 128/80 | HR 69 | Temp 97.7°F | Ht 66.0 in | Wt 274.0 lb

## 2020-05-23 DIAGNOSIS — I10 Essential (primary) hypertension: Secondary | ICD-10-CM | POA: Diagnosis not present

## 2020-05-23 DIAGNOSIS — R0602 Shortness of breath: Secondary | ICD-10-CM | POA: Diagnosis not present

## 2020-05-23 DIAGNOSIS — Z9189 Other specified personal risk factors, not elsewhere classified: Secondary | ICD-10-CM

## 2020-05-23 DIAGNOSIS — E1159 Type 2 diabetes mellitus with other circulatory complications: Secondary | ICD-10-CM

## 2020-05-23 DIAGNOSIS — R5383 Other fatigue: Secondary | ICD-10-CM | POA: Diagnosis not present

## 2020-05-23 DIAGNOSIS — E559 Vitamin D deficiency, unspecified: Secondary | ICD-10-CM

## 2020-05-23 DIAGNOSIS — Z1331 Encounter for screening for depression: Secondary | ICD-10-CM | POA: Diagnosis not present

## 2020-05-23 DIAGNOSIS — I152 Hypertension secondary to endocrine disorders: Secondary | ICD-10-CM | POA: Insufficient documentation

## 2020-05-23 DIAGNOSIS — D508 Other iron deficiency anemias: Secondary | ICD-10-CM

## 2020-05-23 DIAGNOSIS — Z6841 Body Mass Index (BMI) 40.0 and over, adult: Secondary | ICD-10-CM

## 2020-05-23 DIAGNOSIS — F411 Generalized anxiety disorder: Secondary | ICD-10-CM | POA: Insufficient documentation

## 2020-05-23 DIAGNOSIS — D649 Anemia, unspecified: Secondary | ICD-10-CM | POA: Diagnosis not present

## 2020-05-23 DIAGNOSIS — E782 Mixed hyperlipidemia: Secondary | ICD-10-CM | POA: Diagnosis not present

## 2020-05-23 DIAGNOSIS — Z0289 Encounter for other administrative examinations: Secondary | ICD-10-CM

## 2020-05-23 DIAGNOSIS — E1169 Type 2 diabetes mellitus with other specified complication: Secondary | ICD-10-CM | POA: Diagnosis not present

## 2020-05-23 DIAGNOSIS — Z8659 Personal history of other mental and behavioral disorders: Secondary | ICD-10-CM

## 2020-05-23 DIAGNOSIS — E785 Hyperlipidemia, unspecified: Secondary | ICD-10-CM

## 2020-05-23 HISTORY — DX: Type 2 diabetes mellitus with other specified complication: E11.69

## 2020-05-23 MED ORDER — METFORMIN HCL 500 MG PO TABS: 250.0000 mg | ORAL_TABLET | Freq: Two times a day (BID) | ORAL | 0 refills | Status: DC

## 2020-05-23 MED FILL — METFORMIN HCL 500 MG TABS: 500 | 30 days supply | Qty: 30 | Fill #0
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
## 2020-05-24 LAB — CBC WITH DIFFERENTIAL/PLATELET
Basophils Absolute: 0 10*3/uL (ref 0.0–0.2)
Basos: 0 %
EOS (ABSOLUTE): 0.2 10*3/uL (ref 0.0–0.4)
Eos: 3 %
Hematocrit: 38.1 % (ref 34.0–46.6)
Hemoglobin: 11.4 g/dL (ref 11.1–15.9)
Immature Grans (Abs): 0 10*3/uL (ref 0.0–0.1)
Immature Granulocytes: 0 %
Lymphocytes Absolute: 2.6 10*3/uL (ref 0.7–3.1)
Lymphs: 28 %
MCH: 22.7 pg — ABNORMAL LOW (ref 26.6–33.0)
MCHC: 29.9 g/dL — ABNORMAL LOW (ref 31.5–35.7)
MCV: 76 fL — ABNORMAL LOW (ref 79–97)
Monocytes Absolute: 0.5 10*3/uL (ref 0.1–0.9)
Monocytes: 5 %
Neutrophils Absolute: 6.2 10*3/uL (ref 1.4–7.0)
Neutrophils: 64 %
Platelets: 391 10*3/uL (ref 150–450)
RBC: 5.03 x10E6/uL (ref 3.77–5.28)
RDW: 14.4 % (ref 11.7–15.4)
WBC: 9.6 10*3/uL (ref 3.4–10.8)

## 2020-05-24 LAB — COMPREHENSIVE METABOLIC PANEL
ALT: 9 IU/L (ref 0–32)
AST: 8 IU/L (ref 0–40)
Albumin/Globulin Ratio: 1.3 (ref 1.2–2.2)
Albumin: 4 g/dL (ref 3.8–4.8)
Alkaline Phosphatase: 91 IU/L (ref 44–121)
BUN/Creatinine Ratio: 14 (ref 9–23)
BUN: 11 mg/dL (ref 6–24)
Bilirubin Total: 0.3 mg/dL (ref 0.0–1.2)
CO2: 24 mmol/L (ref 20–29)
Calcium: 9.2 mg/dL (ref 8.7–10.2)
Chloride: 102 mmol/L (ref 96–106)
Creatinine, Ser: 0.79 mg/dL (ref 0.57–1.00)
GFR calc Af Amer: 105 mL/min/{1.73_m2} (ref >59)
GFR calc non Af Amer: 91 mL/min/{1.73_m2} (ref >59)
Globulin, Total: 3.1 g/dL (ref 1.5–4.5)
Glucose: 259 mg/dL — ABNORMAL HIGH (ref 65–99)
Potassium: 4.3 mmol/L (ref 3.5–5.2)
Sodium: 138 mmol/L (ref 134–144)
Total Protein: 7.1 g/dL (ref 6.0–8.5)

## 2020-05-24 LAB — LIPID PANEL WITH LDL/HDL RATIO
Cholesterol, Total: 193 mg/dL (ref 100–199)
HDL: 43 mg/dL (ref >39)
LDL Chol Calc (NIH): 129 mg/dL — ABNORMAL HIGH (ref 0–99)
LDL/HDL Ratio: 3 ratio (ref 0.0–3.2)
Triglycerides: 116 mg/dL (ref 0–149)
VLDL Cholesterol Cal: 21 mg/dL (ref 5–40)

## 2020-05-24 LAB — HEMOGLOBIN A1C
Est. average glucose Bld gHb Est-mCnc: 197 mg/dL
Hgb A1c MFr Bld: 8.5 % — ABNORMAL HIGH (ref 4.8–5.6)

## 2020-05-24 LAB — T4, FREE: Free T4: 1.1 ng/dL (ref 0.82–1.77)

## 2020-05-24 LAB — T3: T3, Total: 85 ng/dL (ref 71–180)

## 2020-05-24 LAB — VITAMIN D 25 HYDROXY (VIT D DEFICIENCY, FRACTURES): Vit D, 25-Hydroxy: 17.6 ng/mL — ABNORMAL LOW (ref 30.0–100.0)

## 2020-05-24 LAB — VITAMIN B12: Vitamin B-12: 387 pg/mL (ref 232–1245)

## 2020-05-24 LAB — TSH: TSH: 2.35 u[IU]/mL (ref 0.450–4.500)

## 2020-05-24 LAB — FOLATE: Folate: 4.9 ng/mL (ref >3.0)

## 2020-05-24 LAB — INSULIN, RANDOM: INSULIN: 37.1 u[IU]/mL — ABNORMAL HIGH (ref 2.6–24.9)

## 2020-05-28 ENCOUNTER — Other Ambulatory Visit: Payer: Self-pay

## 2020-05-28 ENCOUNTER — Telehealth (INDEPENDENT_AMBULATORY_CARE_PROVIDER_SITE_OTHER): Payer: BC Managed Care – PPO | Admitting: Psychology

## 2020-05-28 DIAGNOSIS — F419 Anxiety disorder, unspecified: Secondary | ICD-10-CM | POA: Diagnosis not present

## 2020-05-28 DIAGNOSIS — F3289 Other specified depressive episodes: Secondary | ICD-10-CM

## 2020-05-28 NOTE — Progress Notes (Signed)
  Office: [phone]  /  Fax: [phone]    Date: June 11, 2020   Appointment Start Time: 1:58pm Duration: 32 minutes Provider: Glennie Isle, Psy.D. Type of Session: Individual Therapy  Location of Patient: Home Location of Provider: Provider's Home Type of Contact: Telepsychological Visit via MyChart Video Visit  Session Content: Teresa Perez is a 44 y.o. female presenting for a follow-up appointment to address the previously established treatment goal of increasing coping skills. Today's appointment was a telepsychological visit due to COVID-19. Teresa Perez provided verbal consent for today's telepsychological appointment and she is aware she is responsible for securing confidentiality on her end of the session. Prior to proceeding with today's appointment, Teresa Perez physical location at the time of this appointment was obtained as well a phone number she could be reached at in the event of technical difficulties. Teresa Perez and this provider participated in today's telepsychological service.   This provider conducted a brief check-in. Teresa Perez stated, "Everything has been pretty good." She discussed increased awareness of physical and emotional hunger, adding, "It's more emotional." This provider and Teresa Perez also discussed the referral placed by Dr. Raliegh Perez with Teresa Perez. Teresa Perez noted an improvement in mood since starting the prescribed medication and also noted an improvement in her interactions with her children. She explained that due to feeling overwhelmed she often found herself yelling to get their attention or yelling when repeating requests to clean up, etc. She was provided the phone number for [facility] and encouraged to call them to schedule her initial appointment; she agreed. Moreover, psychoeducation regarding triggers for emotional eating was provided. Teresa Perez was provided a handout, and encouraged to utilize the handout between now and the next appointment to  increase awareness of triggers and frequency. Teresa Perez agreed. This provider also discussed behavioral strategies for specific triggers, such as placing the utensil down when conversing to avoid mindless eating. Teresa Perez provided verbal consent during today's appointment for this provider to send a handout about triggers via e-mail.  Teresa Perez was receptive to today's appointment as evidenced by openness to sharing, responsiveness to feedback, and willingness to explore triggers for emotional eating.  Mental Status Examination:  Appearance: well groomed and appropriate hygiene  Behavior: appropriate to circumstances Mood: euthymic Affect: mood congruent Speech: normal in rate, volume, and tone Eye Contact: appropriate Psychomotor Activity: appropriate Gait: unable to assess Thought Process: linear, logical, and goal directed  Thought Content/Perception: no hallucinations, delusions, bizarre thinking or behavior reported or observed and no evidence of suicidal and homicidal ideation, plan, and intent Orientation: time, person, place, and purpose of appointment Memory/Concentration: memory, attention, language, and fund of knowledge intact  Insight/Judgment: good  Interventions:  Conducted a brief chart review Provided empathic reflections and validation Reviewed content from the previous session Employed supportive psychotherapy interventions to facilitate reduced distress and to improve coping skills with identified stressors Psychoeducation provided regarding triggers for emotional eating  DSM-5 Diagnosis(es): 311 (F32.8) Other Specified Depressive Disorder, Emotional and Binge Eating Behaviors and 300.00 (F41.9) Unspecified Anxiety Disorder  Treatment Goal & Progress: During the initial appointment with this provider, the following treatment goal was established: increase coping skills. Teresa Perez has demonstrated progress in her goal as evidenced by increased awareness of hunger patterns.   Plan: The  next appointment will be scheduled in approximately one week, which will be via MyChart Video Visit to discuss strategies for her upcoming vacation. The next session will focus on working towards the established treatment goal.

## 2020-05-28 NOTE — Progress Notes (Signed)
Dear Dr. Nehemiah Settle,   Thank you for referring Teresa Perez to our clinic. The following note includes my evaluation and treatment recommendations.  Chief Complaint:   OBESITY Teresa Perez (MR# [id number]) is a 44 y.o. female who presents for evaluation and treatment of obesity and related comorbidities. Current BMI is Body mass index is 44.22 kg/m. Teresa Perez has been struggling with her weight for many years and has been unsuccessful in either losing weight, maintaining weight loss, or reaching her healthy weight goal.  Teresa Perez is currently in the action stage of change and ready to dedicate time achieving and maintaining a healthier weight. Teresa Perez is interested in becoming our patient and working on intensive lifestyle modifications including (but not limited to) diet and exercise for weight loss.  Teresa Perez is single and works full time as a Art gallery manager.  She has 6 children in the house with ages ranging from 46 years old to 45 years old.  She skips lunch most days "at work" - working from home.  Teresa Perez's habits were reviewed today and are as follows: Her family eats meals together, she thinks her family will eat healthier with her, she struggles with family and or coworkers weight loss sabotage, her desired weight loss is 80 pounds, she started gaining weight in 10th grade, her heaviest weight ever was 282 pounds, she craves fats and chocolate, she snacks frequently in the evenings, she skips lunch frequently, she is frequently drinking liquids with calories, she frequently makes poor food choices, she frequently eats larger portions than normal and she struggles with emotional eating.  Depression Screen Teresa Perez's Food and Mood (modified PHQ-9) score was 10.  Depression screen Teresa Perez 2/9 05/23/2020  Decreased Interest 2  Down, Depressed, Hopeless 1  PHQ - 2 Score 3  Altered sleeping 2  Tired, decreased energy 2  Change in appetite 2  Feeling bad or failure about yourself  1  Trouble  concentrating 0  Moving slowly or fidgety/restless 0  Suicidal thoughts 0  PHQ-9 Score 10   Subjective:   1. Other fatigue Teresa Perez admits to daytime somnolence and reports waking up still tired. Patent has a history of symptoms of daytime fatigue, morning fatigue and occasional snoring. Teresa Perez generally gets 6 hours of sleep per night, and states that she has generally restful sleep. Snoring is present. Apneic episodes are not present. Epworth Sleepiness Score is 7.  2. Shortness of breath on exertion Teresa Perez notes increasing shortness of breath with exercising and seems to be worsening over time with weight gain. She notes getting out of breath sooner with activity than she used to. This has gotten worse recently. Teresa Perez denies shortness of breath at rest or orthopnea.  3. Hyperlipidemia associated with type 2 diabetes mellitus ([name]) Teresa Perez has hyperlipidemia and has been trying to improve her cholesterol levels with intensive lifestyle modification including a low saturated fat diet, exercise and weight loss. She denies any chest pain, claudication or myalgias.  Diet controlled.  She stopped her simvastatin on her own 2+ months ago.  Her PCP told her to take it daily.  Lab Results  Component Value Date   ALT 9 05/23/2020   AST 8 05/23/2020   ALKPHOS 91 05/23/2020   BILITOT 0.3 05/23/2020   Lab Results  Component Value Date   CHOL 193 05/23/2020   HDL 43 05/23/2020   LDLCALC 129 (H) 05/23/2020   TRIG 116 05/23/2020   CHOLHDL 4 02/01/2020   4. Type 2 diabetes mellitus with other  specified complication, without long-term current use of insulin (HCC) Medications reviewed. Diabetic ROS: no polyuria or polydipsia, no chest pain, dyspnea or TIA's, no numbness, tingling or pain in extremities.  PCP sent in metformin, and she started it back in June.  She stopped taking it due to GI intolerance.  A1c was 7.6 on 02/01/2020.  On 02/17/2018, A1c was 6.5.    Lab Results  Component Value Date   HGBA1C  8.5 (H) 05/23/2020   HGBA1C 7.6 (H) 02/01/2020   HGBA1C 7.0 (H) 09/22/2018   Lab Results  Component Value Date   MICROALBUR 1.2 02/01/2020   LDLCALC 129 (H) 05/23/2020   CREATININE 0.79 05/23/2020   Lab Results  Component Value Date   INSULIN 37.1 (H) 05/23/2020   5. Hypertension associated with diabetes ([place]) Review: taking medications as instructed, no medication side effects noted, no chest pain on exertion, no dyspnea on exertion, no swelling of ankles.  Diet controlled currently.  BP Readings from Last 3 Encounters:  05/23/20 128/80  04/04/20 128/87  02/01/20 126/84   6. Other iron deficiency anemia Teresa Perez is not a vegetarian.  She does not have a history of weight loss surgery.  She is not on any medication or supplements for this.  CBC Latest Ref Rng & Units 05/23/2020 02/01/2020 09/22/2018  WBC 3.4 - 10.8 x10E3/uL 9.6 10.2 14.0(H)  Hemoglobin 11.1 - 15.9 g/dL 11.4 10.4(L) 10.5(L)  Hematocrit 34.0 - 46.6 % 38.1 33.8(L) 34.9(L)  Platelets 150 - 450 x10E3/uL 391 358.0 455.0(H)   Lab Results  Component Value Date   VITAMINB12 387 05/23/2020   7. Vitamin D deficiency She is currently taking no vitamin D supplement. She denies nausea, vomiting or muscle weakness.  She endorses fatigue.  8. History of anxiety She takes Xanax as needed.  She has occasional panic attacks (less than once a week/month).  9. Depression screening Teresa Perez was screened for depression as part of her new patient workup.  PHQ-9 is 10.  10. At risk for deficient knowledge of diabetes mellitus Teresa Perez is at risk of deficient knowledge of diabetes mellitus because she was not aware she was diabetic (since 2019).    Teresa Perez was given extensive diabetes prevention education and counseling today of more than 7.5 minutes.  - Counseled patient on pathophysiology of disease and discussed various treatment options which always includes dietary and lifestyle modification as first line.   - Importance of healthy  diet with very limited amounts of simple carbohydrates discussed with patient in addition to regular aerobic exercise of 32mn 5d/week or more.  - Handouts provided at patient's desire and or told to go online at the American Diabetes Association website for further information.  This is the patient's first visit at [facility].  The patient's NEW PATIENT PACKET that they filled out prior to today's office visit was reviewed at length and some information from that paperwork was also included within the following office visit note.    Included in the packet: current and past health history, medications, allergies, ROS, gynecologic history (women only), surgical history, family history, social history, weight history, weight loss surgery history (for those that have had weight loss surgery), nutritional evaluation, mood and food questionnaire along with a depression screening (PHQ9) on all patients, an Epworth questionnaire, sleep habits questionnaire, patient life and health improvement goals questionnaire. These will all be scanned into the patient's chart under media.   During the visit, I independently reviewed the patient's EKG, bioimpedance scale results,  and indirect calorimeter results. I used this information to tailor a meal plan for the patient that will help Teresa Perez to lose weight and will improve her obesity-related conditions going forward. I performed a medically necessary appropriate examination and/or evaluation. I discussed the assessment and treatment plan with the patient. The patient was provided an opportunity to ask questions and all were answered. The patient agreed with the plan and demonstrated an understanding of the instructions. Labs were ordered at this visit and will be reviewed at the next visit unless more critical results need to be addressed immediately. Clinical information was updated and documented in the EMR.   Time spent on visit including pre-visit chart  review and post-visit care was estimated to be 60-74 minutes.  A separate 15 minutes was spent on risk counseling (see above/below).   Assessment/Plan:   1. Other fatigue Taffy does feel that her weight is causing her energy to be lower than it should be. Fatigue may be related to obesity, depression or many other causes. Labs will be ordered, and in the meanwhile, Teresa Perez will focus on self care including making healthy food choices, increasing physical activity and focusing on stress reduction.  - EKG 12-Lead - Vitamin B12 - CBC with Differential/Platelet - Comprehensive metabolic panel - T3 - T4, free - TSH - VITAMIN D 25 Hydroxy (Vit-D Deficiency, Fractures) - Insulin, random - Hemoglobin A1c - Folate  2. Shortness of breath on exertion Teresa Perez does feel that she gets out of breath more easily that she used to when she exercises. Teresa Perez's shortness of breath appears to be obesity related and exercise induced. She has agreed to work on weight loss and gradually increase exercise to treat her exercise induced shortness of breath. Will continue to monitor closely.  - Vitamin B12 - CBC with Differential/Platelet - T3 - T4, free - TSH - VITAMIN D 25 Hydroxy (Vit-D Deficiency, Fractures)  3. Hyperlipidemia associated with type 2 diabetes mellitus ([place]) Cardiovascular risk and specific lipid/LDL goals reviewed.  We discussed several lifestyle modifications today and Teresa Perez will continue to work on diet, exercise and weight loss efforts. Orders and follow up as documented in patient record.  Check labs, restart your cholesterol medication after labs are drawn today.  Weight loss through prudent nutritional plan.  Counseling Intensive lifestyle modifications are the first line treatment for this issue. . Dietary changes: Increase soluble fiber. Decrease simple carbohydrates. . Exercise changes: Moderate to vigorous-intensity aerobic activity 150 minutes per week if  tolerated. . Lipid-lowering medications: see documented in medical record.  - Lipid Panel With LDL/HDL Ratio  4. Type 2 diabetes mellitus with other specified complication, without long-term current use of insulin (HCC) - WORSENING Good blood sugar control is important to decrease the likelihood of diabetic complications such as nephropathy, neuropathy, limb loss, blindness, coronary artery disease, and death. Intensive lifestyle modification including diet, exercise and weight loss are the first line of treatment for diabetes.  Risks and benefits of medication discussed with patient.  She thinks she may be able to tolerate a low dose.  Handout was given on metformin and she was highly encouraged to get back on metformin.  She will start on 250 mg twice daily.  I sent in a new script today.  - Insulin, random - Hemoglobin A1c  -Start metFORMIN (GLUCOPHAGE) 500 MG tablet; Take 0.5 tablets (250 mg total) by mouth 2 (two) times daily with a meal.  Dispense: 30 tablet; Refill: 0  5. Hypertension associated with diabetes ([place])  Teresa Perez is working on healthy weight loss and exercise to improve blood pressure control. We will watch for signs of hypotension as she continues her lifestyle modifications.  Blood pressure is at goal today.  Consider adding low dose ARB in the future.  Closely monitor.  Check labs today.  - T3 - T4, free - TSH  6. Other iron deficiency anemia Orders and follow up as documented in patient record.  Check labs today.  Counseling . Iron is essential for our bodies to make red blood cells.  Reasons that someone may be deficient include: an iron-deficient diet (more likely in those following vegan or vegetarian diets), women with heavy menses, patients with GI disorders or poor absorption, patients that have had bariatric surgery, frequent blood donors, patients with cancer, and patients with heart disease.   [place] foods include dark leafy greens, red and white meats,  eggs, seafood, and beans.   . Certain foods and drinks prevent your body from absorbing iron properly. Avoid eating these foods in the same meal as iron-rich foods or with iron supplements. These foods include: coffee, black tea, and red wine; milk, dairy products, and foods that are high in calcium; beans and soybeans; whole grains.  . Constipation can be a side effect of iron supplementation. Increased water and fiber intake are helpful. Water goal: > 2 liters/day. Fiber goal: > 25 grams/day.  - Vitamin B12 - CBC with Differential/Platelet - Folate  7. Vitamin D deficiency Low Vitamin D level contributes to fatigue and are associated with obesity, breast, and colon cancer.  Check labs today.  - VITAMIN D 25 Hydroxy (Vit-D Deficiency, Fractures)  8. History of anxiety Patient was referred to Dr. Mallie Mussel, our Bariatric Psychologist, for evaluation due to her elevated PHQ-9 score and significant struggles with emotional eating.  9. Depression screening Teresa Perez had a positive depression screening. Depression is commonly associated with obesity and often results in emotional eating behaviors. We will monitor this closely and work on CBT to help improve the non-hunger eating patterns. Referral to Psychology may be required if no improvement is seen as she continues in our clinic.  10. At risk for deficient knowledge of diabetes mellitus - Teresa Perez was given extensive diabetes prevention education and counseling today of more than 7.5 minutes.  - Counseled patient on pathophysiology of disease and discussed various treatment options which always includes dietary and lifestyle modification as first line.   - Importance of healthy diet with very limited amounts of simple carbohydrates discussed with patient in addition to regular aerobic exercise of 42mn 5d/week or more.  - Handouts provided at patient's desire and or told to go online at the American Diabetes Association website for further  information  11. Class 3 severe obesity with serious comorbidity and body mass index (BMI) of 40.0 to 44.9 in adult, unspecified obesity type (HCC)  Teresa Perez currently in the action stage of change and her goal is to continue with weight loss efforts. I recommend Teresa Perez the structured treatment plan as follows:  She has agreed to the Category 3 Plan.  Exercise goals: As is.   Behavioral modification strategies: increasing water intake, decreasing liquid calories, meal planning and cooking strategies, keeping healthy foods in the home and planning for success.  She was informed of the importance of frequent follow-up visits to maximize her success with intensive lifestyle modifications for her multiple health conditions. She was informed we would discuss her lab results at her next visit unless there is  a critical issue that needs to be addressed sooner. Teresa Perez agreed to keep her next visit at the agreed upon time to discuss these results.  Objective:   Blood pressure 128/80, pulse 69, temperature 97.7 F (36.5 C), height 5' 6"  (1.676 m), weight 274 lb (124.3 kg), SpO2 99 %. Body mass index is 44.22 kg/m.  EKG: Normal sinus rhythm, rate 68 bpm.  Indirect Calorimeter completed today shows a VO2 of 336 and a REE of 2341.  Her calculated basal metabolic rate is 2500 thus her basal metabolic rate is better than expected.  General: Cooperative, alert, well developed, in no acute distress. HEENT: Conjunctivae and lids unremarkable. Cardiovascular: Regular rhythm.  Lungs: Normal work of breathing. Neurologic: No focal deficits.   Lab Results  Component Value Date   CREATININE 0.79 05/23/2020   BUN 11 05/23/2020   NA 138 05/23/2020   K 4.3 05/23/2020   CL 102 05/23/2020   CO2 24 05/23/2020   Lab Results  Component Value Date   ALT 9 05/23/2020   AST 8 05/23/2020   ALKPHOS 91 05/23/2020   BILITOT 0.3 05/23/2020   Lab Results  Component Value Date   HGBA1C 8.5 (H) 05/23/2020    HGBA1C 7.6 (H) 02/01/2020   HGBA1C 7.0 (H) 09/22/2018   HGBA1C 6.5 02/17/2018   HGBA1C 6.4 07/29/2017   Lab Results  Component Value Date   INSULIN 37.1 (H) 05/23/2020   Lab Results  Component Value Date   TSH 2.350 05/23/2020   Lab Results  Component Value Date   CHOL 193 05/23/2020   HDL 43 05/23/2020   LDLCALC 129 (H) 05/23/2020   TRIG 116 05/23/2020   CHOLHDL 4 02/01/2020   Lab Results  Component Value Date   WBC 9.6 05/23/2020   HGB 11.4 05/23/2020   HCT 38.1 05/23/2020   MCV 76 (L) 05/23/2020   PLT 391 05/23/2020   Attestation Statements:   Reviewed by clinician on day of visit: allergies, medications, problem list, medical history, surgical history, family history, social history, and previous encounter notes.  I, Water quality scientist, CMA, am acting as Location manager for [employer], DO.  I have reviewed the above documentation for accuracy and completeness, and I agree with the above. Mellody Dance, DO

## 2020-06-06 ENCOUNTER — Ambulatory Visit (INDEPENDENT_AMBULATORY_CARE_PROVIDER_SITE_OTHER): Payer: BC Managed Care – PPO | Admitting: Family Medicine

## 2020-06-06 ENCOUNTER — Other Ambulatory Visit: Payer: Self-pay

## 2020-06-06 ENCOUNTER — Encounter (INDEPENDENT_AMBULATORY_CARE_PROVIDER_SITE_OTHER): Payer: Self-pay | Admitting: Family Medicine

## 2020-06-06 ENCOUNTER — Other Ambulatory Visit (INDEPENDENT_AMBULATORY_CARE_PROVIDER_SITE_OTHER): Payer: Self-pay | Admitting: Family Medicine

## 2020-06-06 VITALS — BP 128/84 | HR 81 | Temp 99.0°F | Ht 66.0 in | Wt 273.0 lb

## 2020-06-06 DIAGNOSIS — Z9189 Other specified personal risk factors, not elsewhere classified: Secondary | ICD-10-CM | POA: Diagnosis not present

## 2020-06-06 DIAGNOSIS — Z6841 Body Mass Index (BMI) 40.0 and over, adult: Secondary | ICD-10-CM

## 2020-06-06 DIAGNOSIS — R030 Elevated blood-pressure reading, without diagnosis of hypertension: Secondary | ICD-10-CM

## 2020-06-06 DIAGNOSIS — E66813 Obesity, class 3: Secondary | ICD-10-CM

## 2020-06-06 DIAGNOSIS — D509 Iron deficiency anemia, unspecified: Secondary | ICD-10-CM | POA: Diagnosis not present

## 2020-06-06 DIAGNOSIS — E1169 Type 2 diabetes mellitus with other specified complication: Secondary | ICD-10-CM | POA: Diagnosis not present

## 2020-06-06 DIAGNOSIS — E785 Hyperlipidemia, unspecified: Secondary | ICD-10-CM

## 2020-06-06 DIAGNOSIS — F3289 Other specified depressive episodes: Secondary | ICD-10-CM

## 2020-06-06 DIAGNOSIS — E559 Vitamin D deficiency, unspecified: Secondary | ICD-10-CM | POA: Diagnosis not present

## 2020-06-06 DIAGNOSIS — F4322 Adjustment disorder with anxiety: Secondary | ICD-10-CM | POA: Insufficient documentation

## 2020-06-06 MED ORDER — FERROUS SULFATE 325 (65 FE) MG PO TBEC: 325.0000 mg | DELAYED_RELEASE_TABLET | Freq: Two times a day (BID) | ORAL | 0 refills | Status: DC

## 2020-06-06 MED ORDER — ATORVASTATIN CALCIUM 20 MG PO TABS: 20.0000 mg | ORAL_TABLET | Freq: Every day | ORAL | 0 refills | Status: DC

## 2020-06-06 MED ORDER — BUPROPION HCL ER (SR) 150 MG PO TB12: 150.0000 mg | ORAL_TABLET | Freq: Two times a day (BID) | ORAL | 0 refills | Status: DC

## 2020-06-06 MED ORDER — VITAMIN D (ERGOCALCIFEROL) 1.25 MG (50000 UNIT) PO CAPS: 50000.0000 [IU] | ORAL_CAPSULE | ORAL | 0 refills | Status: DC

## 2020-06-06 MED ORDER — METFORMIN HCL 1000 MG PO TABS: ORAL_TABLET | ORAL | 0 refills | Status: DC

## 2020-06-06 MED FILL — FERROUS SULFATE 325 MG TAB: 325 (65 FE) | 50 days supply | Qty: 100 | Fill #0

## 2020-06-06 MED FILL — ATORVASTATIN CALCIUM 20 MG: 20 | 30 days supply | Qty: 30 | Fill #0

## 2020-06-06 MED FILL — BUPROPION HCL ER (SR) 150 M: 150 | 30 days supply | Qty: 60 | Fill #0

## 2020-06-06 MED FILL — VIT D2 1.25 MG (50,000 UNIT: 1.25 MG | 28 days supply | Qty: 4 | Fill #0

## 2020-06-06 MED FILL — METFORMIN HCL 1000 MG TABS: 1000 | 30 days supply | Qty: 60 | Fill #0

## 2020-06-10 NOTE — Progress Notes (Signed)
Chief Complaint:   OBESITY Teresa Perez is here to discuss her progress with her obesity treatment plan along with follow-up of her obesity related diagnoses. Keslyn is on the Category 3 Plan and states she is following her eating plan approximately 50% of the time. Teresa Perez states she is walking and riding the stationary bike for 30 minutes 3-4 times per week.  Today's visit was #: 2 Starting weight: 274 lbs Starting date: 05/23/2020 Today's weight: 273 lbs Today's date: 06/06/2020 Total lbs lost to date: 1 lb Total lbs lost since last in-office visit: 1 lb  Interim History:  Teresa Perez says she has not really been following the plan.  She finds it difficult to eat healthy and also to find time to care for herself.   When we started discussing obstacles, she started crying and admitted she is significantly struggling with depression and even getting out of bed some days to work.  She cries a lot, "short fused" with her 6 kids, lacks patience, and yells a lot at them.  She stopped Prozac on her own in July and did not follow-up with her PCP.  She denies SI.   Assessment/Plan:    Orders Placed This Encounter  Procedures  . Ambulatory referral to Psychology   Meds ordered this encounter  Medications  . buPROPion (WELLBUTRIN SR) 150 MG 12 hr tablet    Sig: Take 1 tablet (150 mg total) by mouth 2 (two) times daily.    Dispense:  60 tablet    Refill:  0  . atorvastatin (LIPITOR) 20 MG tablet    Sig: Take 1 tablet (20 mg total) by mouth at bedtime.    Dispense:  30 tablet    Refill:  0  . Vitamin D, Ergocalciferol, (DRISDOL) 1.25 MG (50000 UNIT) CAPS capsule    Sig: Take 1 capsule (50,000 Units total) by mouth every 7 (seven) days.    Dispense:  4 capsule    Refill:  0  . ferrous sulfate 325 (65 FE) MG EC tablet    Sig: Take 1 tablet (325 mg total) by mouth 2 (two) times daily with a meal.    Dispense:  60 tablet    Refill:  0  . metFORMIN (GLUCOPHAGE) 1000 MG tablet    Sig: 533m po bid  for 1 week, then inc to 10038mpo bid    Dispense:  60 tablet    Refill:  0     1. Type 2 diabetes mellitus with other specified complication, without long-term current use of insulin (HCUniopolisDiscussed labs with patient today.  Started metformin at last office visit.  Tolerating well and she is taking it regularly.  It helps with her carb cravings and makes her have GI upset when she eats unhealthy foods.  She has not checked her blood sugar.  Plan:  Increase metformin to 1000 mg twice daily.  -Increase metFORMIN (GLUCOPHAGE) 1000 MG tablet; 50060mo bid for 1 week, then inc to 1000m56m bid  Dispense: 60 tablet; Refill: 0  2. Hyperlipidemia associated with type 2 diabetes mellitus (HCC)[place] Parkscussed labs with patient today. She is not currently taking any cholesterol medication.  Lab Results  Component Value Date   CHOL 193 05/23/2020   HDL 43 05/23/2020   LDLCALC 129 (H) 05/23/2020   TRIG 116 05/23/2020   CHOLHDL 4 02/01/2020   Lab Results  Component Value Date   ALT 9 05/23/2020   AST 8 05/23/2020   ALKPHOS 91 05/23/2020  BILITOT 0.3 05/23/2020   The 10-year ASCVD risk score Teresa Perez., et al., 2013) is: 14.9%   Values used to calculate the score:     Age: 68 years     Sex: Female     Is Non-Hispanic African American: Yes     Diabetic: Yes     Tobacco smoker: Yes     Systolic Blood Pressure: 195 mmHg     Is BP treated: Yes     HDL Cholesterol: 43 mg/dL     Total Cholesterol: 193 mg/dL  Plan:  Start Lipitor 20 mg daily.  Recheck ALT in 6 weeks and check FLP in 3 months with all other labs.  -Start atorvastatin (LIPITOR) 20 MG tablet; Take 1 tablet (20 mg total) by mouth at bedtime.  Dispense: 30 tablet; Refill: 0  3. Vitamin D deficiency Discussed labs with patient today.  Current vitamin D is 17.6, tested on 05/23/2020. Not at goal. Optimal goal > 50 ng/dL. There is also evidence to support a goal of >70 ng/dL in patients with cancer and heart disease.   Plan: Start  Vitamin D @50 ,000 IU every week with follow-up for routine testing of Vitamin D at least 2-3 times per year to avoid over-replacement.  Check vitamin D level in 3 months.  -Start Vitamin D, Ergocalciferol, (DRISDOL) 1.25 MG (50000 UNIT) CAPS capsule; Take 1 capsule (50,000 Units total) by mouth every 7 (seven) days.  Dispense: 4 capsule; Refill: 0  4. Iron deficiency anemia, unspecified iron deficiency anemia type Discussed labs with patient today.  She was not taking her iron supplement regularly, and was only taking it sporadically.  Plan:  Start ferrous sulfate 325 mg twice daily.  Check labs at next blood draw.  -Start ferrous sulfate 325 (65 FE) MG EC tablet; Take 1 tablet (325 mg total) by mouth 2 (two) times daily with a meal.  Dispense: 60 tablet; Refill: 0  5. Elevated blood pressure reading Discussed labs with patient today.  She is on no medication currently (used to be on an ACE inhibitor).    Plan:  I told her that her goal blood pressure is <120-80 on a regular basis. She will check it at home and we will closely follow every 2 weeks.  We recommended to her that she follow-up with her PCP regarding starting an ARB at low dose due to blood pressure and medical history.  6. Other depression, with emotional eating Discussed labs with patient today.  Denies SI.  She has been on Effexor and Zoloft in the past.  She says that Zoloft did not help.  She was most recently on Prozac.  She stopped it on her own.  She has also tried Wellbutrin and thinks it worked well, without side effects.  She met with Dr. Mallie Mussel and it went well, but Makensey says she needs additional counseling.  Plan:  Start Wellbutrin 150 SR twice daily and will closely monitor.  Referral has also been placed for [facility] for counseling.  -Start buPROPion (WELLBUTRIN SR) 150 MG 12 hr tablet; Take 1 tablet (150 mg total) by mouth 2 (two) times daily.  Dispense: 60 tablet; Refill: 0 - Ambulatory referral to Psychology  7. At  risk for heart disease Due to Teresa Perez's current state of health and medical condition(s), she is at a higher risk for heart disease.   This puts the patient at much greater risk to subsequently develop cardiopulmonary conditions that can significantly affect patient's quality of life in a  negative manner as well.    At least 32+ minutes was spent on counseling Keanu about these concerns today and I stressed the importance of reversing risks factors of obesity, esp truncal and visceral fat, hypertension, hyperlipidemia, pre-diabetes.   Initial goal is to lose at least 5-10% of starting weight to help reduce these risk factors.   Counseling: Intensive lifestyle modifications discussed with Rupa as most appropriate first line treatment.  she will continue to work on diet, exercise and weight loss efforts.  We will continue to reassess these conditions on a fairly regular basis in an attempt to decrease patient's overall morbidity and mortality.  Evidence-based interventions for health behavior change were utilized today including the discussion of self monitoring techniques, problem-solving barriers and SMART goal setting techniques.  Specifically regarding patient's less desirable eating habits and patterns, we employed the technique of small changes when Keely has not been able to fully commit to her prudent nutritional plan.  8. Class 3 severe obesity with serious comorbidity and body mass index (BMI) of 40.0 to 44.9 in adult, unspecified obesity type (HCC)  Etienne is currently in the action stage of change. As such, her goal is to continue with weight loss efforts. She has agreed to the Category 3 Plan.   Exercise goals: As is.  Behavioral modification strategies: increasing lean protein intake, decreasing simple carbohydrates, increasing water intake, decreasing liquid calories, no skipping meals, meal planning and cooking strategies, keeping healthy foods in the home and planning for success.  Helon  has agreed to follow-up with our clinic in 2 weeks. She was informed of the importance of frequent follow-up visits to maximize her success with intensive lifestyle modifications for her multiple health conditions.    Objective:   Blood pressure 128/71, pulse 81, temperature 99 F (37.2 C), SpO2 99 %. There is no height or weight on file to calculate BMI.  General: Cooperative, alert, well developed, in no acute distress. HEENT: Conjunctivae and lids unremarkable. Cardiovascular: Regular rhythm.  Lungs: Normal work of breathing. Neurologic: No focal deficits.   Lab Results  Component Value Date   CREATININE 0.79 05/23/2020   BUN 11 05/23/2020   NA 138 05/23/2020   K 4.3 05/23/2020   CL 102 05/23/2020   CO2 24 05/23/2020   Lab Results  Component Value Date   ALT 9 05/23/2020   AST 8 05/23/2020   ALKPHOS 91 05/23/2020   BILITOT 0.3 05/23/2020   Lab Results  Component Value Date   HGBA1C 8.5 (H) 05/23/2020   HGBA1C 7.6 (H) 02/01/2020   HGBA1C 7.0 (H) 09/22/2018   HGBA1C 6.5 02/17/2018   HGBA1C 6.4 07/29/2017   Lab Results  Component Value Date   INSULIN 37.1 (H) 05/23/2020   Lab Results  Component Value Date   TSH 2.350 05/23/2020   Lab Results  Component Value Date   CHOL 193 05/23/2020   HDL 43 05/23/2020   LDLCALC 129 (H) 05/23/2020   TRIG 116 05/23/2020   CHOLHDL 4 02/01/2020   Lab Results  Component Value Date   WBC 9.6 05/23/2020   HGB 11.4 05/23/2020   HCT 38.1 05/23/2020   MCV 76 (L) 05/23/2020   PLT 391 05/23/2020   Attestation Statements:   Reviewed by clinician on day of visit: allergies, medications, problem list, medical history, surgical history, family history, social history, and previous encounter notes.   I, Water quality scientist, CMA, am acting as Location manager for [employer], DO.  I have reviewed the above documentation for  accuracy and completeness, and I agree with the above. -  *Marjory Sneddon, D.O.  The [place] was signed into law in 2016 which includes the topic of electronic health records.  This provides immediate access to information in MyChart.  This includes consultation notes, operative notes, office notes, lab results and pathology reports.  If you have any questions about what you read please let us know at your next visit so we can discuss your concerns and take corrective action if need be.  We are right here with you.

## 2020-06-11 ENCOUNTER — Telehealth (INDEPENDENT_AMBULATORY_CARE_PROVIDER_SITE_OTHER): Payer: BC Managed Care – PPO | Admitting: Psychology

## 2020-06-11 DIAGNOSIS — F3289 Other specified depressive episodes: Secondary | ICD-10-CM

## 2020-06-11 DIAGNOSIS — F419 Anxiety disorder, unspecified: Secondary | ICD-10-CM | POA: Diagnosis not present

## 2020-06-11 NOTE — Progress Notes (Unsigned)
Office: [phone]  /  Fax: [phone]    Date: June 19, 2020   Appointment Start Time: *** Duration: *** minutes Provider: Glennie Isle, Psy.D. Type of Session: Individual Therapy  Location of Patient: {gbptloc:23249} Location of Provider: Provider's Home Type of Contact: Telepsychological Visit via MyChart Video Visit  Session Content: Teresa Perez is a 44 y.o. female presenting for a follow-up appointment to address the previously established treatment goal of increasing coping skills. Today's appointment was a telepsychological visit due to COVID-19. Teresa Perez provided verbal consent for today's telepsychological appointment and she is aware she is responsible for securing confidentiality on her end of the session. Prior to proceeding with today's appointment, Teresa Perez's physical location at the time of this appointment was obtained as well a phone number she could be reached at in the event of technical difficulties. Teresa Perez and this provider participated in today's telepsychological service.   This provider conducted a brief check-in and verbally administered the PHQ-9 and GAD-7. *** Teresa Perez was receptive to today's appointment as evidenced by openness to sharing, responsiveness to feedback, and {gbreceptiveness:23401}.  Mental Status Examination:  Appearance: {Appearance:22431} Behavior: {Behavior:22445} Mood: {gbmood:21757} Affect: {Affect:22436} Speech: {Speech:22432} Eye Contact: {Eye Contact:22433} Psychomotor Activity: {Motor Activity:22434} Gait: {gbgait:23404} Thought Process: {thought process:22448}  Thought Content/Perception: {disturbances:22451} Orientation: {Orientation:22437} Memory/Concentration: {gbcognition:22449} Insight/Judgment: {Insight:22446}  Structured Assessments Results: The Patient Health Questionnaire-9 (PHQ-9) is a self-report measure that assesses symptoms and severity of depression over the course of the last two weeks. Teresa Perez obtained a score of ***  suggesting {GBPHQ9SEVERITY:21752}. Teresa Perez finds the endorsed symptoms to be {gbphq9difficulty:21754}. [0= Not at all; 1= Several days; 2= More than half the days; 3= Nearly every day] Little interest or pleasure in doing things ***  Feeling down, depressed, or hopeless ***  Trouble falling or staying asleep, or sleeping too much ***  Feeling tired or having little energy ***  Poor appetite or overeating ***  Feeling bad about yourself --- or that you are a failure or have let yourself or your family down ***  Trouble concentrating on things, such as reading the newspaper or watching television ***  Moving or speaking so slowly that other people could have noticed? Or the opposite --- being so fidgety or restless that you have been moving around a lot more than usual ***  Thoughts that you would be better off dead or hurting yourself in some way ***  PHQ-9 Score ***    The Generalized Anxiety Disorder-7 (GAD-7) is a brief self-report measure that assesses symptoms of anxiety over the course of the last two weeks. Teresa Perez obtained a score of *** suggesting {gbgad7severity:21753}. Teresa Perez finds the endorsed symptoms to be {gbphq9difficulty:21754}. [0= Not at all; 1= Several days; 2= Over half the days; 3= Nearly every day] Feeling nervous, anxious, on edge ***  Not being able to stop or control worrying ***  Worrying too much about different things ***  Trouble relaxing ***  Being so restless that it's hard to sit still ***  Becoming easily annoyed or irritable ***  Feeling afraid as if something awful might happen ***  GAD-7 Score ***   Interventions:  {Interventions for Progress Notes:23405}  DSM-5 Diagnosis(es): 311 (F32.8) Other Specified Depressive Disorder, Emotional and Binge Eating Behaviors and 300.00 (F41.9) Unspecified Anxiety Disorder  Treatment Goal & Progress: During the initial appointment with this provider, the following treatment goal was established: increase coping skills.  Teresa Perez has demonstrated progress in her goal as evidenced by {gbtxprogress:22839}. Teresa Perez also {gbtxprogress2:22951}.  Plan: The next appointment  will be scheduled in {gbweeks:21758}, which will be {gbtxmodality:23402}. The next session will focus on {Plan for Next Appointment:23400}.

## 2020-06-19 ENCOUNTER — Telehealth (INDEPENDENT_AMBULATORY_CARE_PROVIDER_SITE_OTHER): Payer: Self-pay | Admitting: Psychology

## 2020-06-24 ENCOUNTER — Ambulatory Visit (INDEPENDENT_AMBULATORY_CARE_PROVIDER_SITE_OTHER): Payer: BC Managed Care – PPO | Admitting: Family Medicine

## 2020-08-02 DIAGNOSIS — U071 COVID-19: Secondary | ICD-10-CM | POA: Diagnosis not present

## 2020-08-08 DIAGNOSIS — U071 COVID-19: Secondary | ICD-10-CM | POA: Diagnosis not present

## 2020-09-11 ENCOUNTER — Ambulatory Visit: Payer: Self-pay

## 2020-09-12 ENCOUNTER — Ambulatory Visit: Payer: Self-pay

## 2021-04-23 ENCOUNTER — Other Ambulatory Visit (HOSPITAL_COMMUNITY)
Admission: RE | Admit: 2021-04-23 | Discharge: 2021-04-23 | Disposition: A | Discharge status: Home / Self Care | Payer: BC Managed Care – PPO | Source: Ambulatory Visit | Attending: Family Medicine | Admitting: Family Medicine

## 2021-04-23 ENCOUNTER — Other Ambulatory Visit: Payer: Self-pay

## 2021-04-23 ENCOUNTER — Ambulatory Visit (INDEPENDENT_AMBULATORY_CARE_PROVIDER_SITE_OTHER): Payer: BC Managed Care – PPO | Admitting: Family Medicine

## 2021-04-23 ENCOUNTER — Encounter: Payer: Self-pay | Admitting: Family Medicine

## 2021-04-23 VITALS — BP 155/88 | HR 77 | Ht 67.0 in | Wt 266.0 lb

## 2021-04-23 DIAGNOSIS — Z01419 Encounter for gynecological examination (general) (routine) without abnormal findings: Secondary | ICD-10-CM | POA: Diagnosis not present

## 2021-04-23 DIAGNOSIS — Z1211 Encounter for screening for malignant neoplasm of colon: Secondary | ICD-10-CM | POA: Diagnosis not present

## 2021-04-23 DIAGNOSIS — Z1212 Encounter for screening for malignant neoplasm of rectum: Secondary | ICD-10-CM

## 2021-04-23 DIAGNOSIS — I152 Hypertension secondary to endocrine disorders: Secondary | ICD-10-CM

## 2021-04-23 DIAGNOSIS — E782 Mixed hyperlipidemia: Secondary | ICD-10-CM

## 2021-04-23 DIAGNOSIS — N939 Abnormal uterine and vaginal bleeding, unspecified: Secondary | ICD-10-CM

## 2021-04-23 DIAGNOSIS — Z1231 Encounter for screening mammogram for malignant neoplasm of breast: Secondary | ICD-10-CM

## 2021-04-23 DIAGNOSIS — E1169 Type 2 diabetes mellitus with other specified complication: Secondary | ICD-10-CM

## 2021-04-23 DIAGNOSIS — E1159 Type 2 diabetes mellitus with other circulatory complications: Secondary | ICD-10-CM

## 2021-04-23 NOTE — Progress Notes (Signed)
GYNECOLOGY ANNUAL PREVENTATIVE CARE ENCOUNTER NOTE  Subjective:   Teresa Perez is a 45 y.o. [phone] female here for a routine annual gynecologic exam.  Current complaints: spotting every 2-3 days in between periods.   Denies abnormal vaginal bleeding, discharge, pelvic pain, problems with intercourse or other gynecologic concerns.    Gynecologic History No LMP recorded. Patient is sexually active  Last Pap: 2021. Results were: normal Last mammogram: 03/2020. Results were: normal Colorectal Cancer Screening: n/a.  Obstetric History OB History  Gravida Para Term Preterm AB Living  _0 0 0 6  SAB IAB Ectopic Multiple Live Births  0 0 0 0 6    # Outcome Date GA Lbr Len/2nd Weight Sex Delivery Anes PTL Lv  6 Term 02/14/17 [redacted]w[redacted]d 8 lb 4.1 oz (3.745 kg) M CS-LTranv Spinal  LIV  5 Term 06/22/13 [id number] lb 8.1 oz (2.95 kg) F CS-LTranv EPI  LIV  4 Term 07/03/11 4035w0d lb 15.3 oz (3.155 kg) F CS-LTranv Spinal  LIV     Birth Comments: normal AGA term  3 Term 04/2011    F CS-Unspec Gen  LIV     Birth Comments: fetal distress, placenta abruptio  2 Term 07/1998   8 lb 1 oz (3.657 kg) M Vag-Spont   LIV  1 Term 04/1995   8 lb 8 oz (3.856 kg) M CS-Unspec EPI  LIV    Past Medical History:  Diagnosis Date   Acute bacterial bronchitis 11/05/2015   Anemia    Anxiety    Asthma    childhood asthma   Back pain    Boil    tendency to form boils   Depression    has been treated in past-no present meds   Family history of anesthesia complication [id number]- age 6 -59"had lockjaw" after surgery (T&A)   Gestational diabetes mellitus (GDM), antepartum 08/17/2016   Fasting on 2 hour was 107   Headache(784.0)    Hyperglycemia    Hypertriglyceridemia    Menorrhagia 10/29/2015   Mixed diabetic hyperlipidemia associated with type 2 diabetes mellitus ([place]23[place]   PCOS (polycystic ovarian syndrome)    Pneumonia    AS A CHILD   Prediabetes     Past Surgical History:  Procedure Laterality  Date   CERVICAL CONIZATION W/BX N/A 10/31/2015   Procedure: CONIZATION CERVIX WITH BIOPSY;  Surgeon: VanEldred MangesD;  [facility];  Service: Gynecology;  Laterality: N/A;   CESAREAN SECTION     x2 previous   CESAREAN SECTION  07/03/2011   Procedure: CESAREAN SECTION;  Surgeon: SanAlwyn PeaD;  [facility];  Service: Gynecology;  Laterality: N/A;   CESAREAN SECTION N/A 06/22/2013   Procedure: CESAREAN SECTION, repeat;  Surgeon: VanEldred MangesD;  [facility];  Service: Obstetrics;  Laterality: N/A;   CESAREAN SECTION WITH BILATERAL TUBAL LIGATION Bilateral 02/14/2017   Procedure: CESAREAN SECTION WITH BILATERAL TUBAL LIGATION;  Surgeon: AnyOsborne OmanD;  [facility];  Laterality: Bilateral;   IUD REMOVAL N/A 10/31/2015   Procedure: INTRAUTERINE DEVICE (IUD) REMOVAL;  Surgeon: VanEldred MangesD;  [facility];  Service: Gynecology;  Laterality: N/A;   MOUTH SURGERY  2004    Current Outpatient Medications on File Prior to Visit  Medication Sig Dispense Refill   ALPRAZolam (XANAX) 0.5 MG tablet TAKE 1/2 TABLETS BY MOUTH 2 TIMES A DAY AS NEEDED FOR  ANXIETY (Patient not taking: Reported on 04/23/2021) 30 tablet 1   atorvastatin (LIPITOR) 20 MG tablet TAKE 1 TABLET (20 MG TOTAL) BY MOUTH AT BEDTIME. (Patient not taking: Reported on 04/23/2021) 30 tablet 0   buPROPion (WELLBUTRIN SR) 150 MG 12 hr tablet TAKE 1 TABLET (150 MG TOTAL) BY MOUTH 2 (TWO) TIMES DAILY. (Patient not taking: Reported on 04/23/2021) 60 tablet 0   ferrous sulfate 325 (65 FE) MG EC tablet Take 1 tablet (325 mg total) by mouth 2 (two) times daily with a meal. (Patient not taking: Reported on 04/23/2021) 60 tablet 0   ferrous sulfate 325 (65 FE) MG tablet TAKE 1 TABLET (325 MG TOTAL) BY MOUTH 2 (TWO) TIMES DAILY WITH A MEAL. (Patient not taking: Reported on 04/23/2021) 100 tablet 0   fexofenadine (ALLEGRA) 180 MG tablet Take 180 mg by mouth daily. (Patient not  taking: Reported on 04/23/2021)     ibuprofen (ADVIL) 200 MG tablet Take 200 mg by mouth every 6 (six) hours as needed. (Patient not taking: Reported on 04/23/2021)     metFORMIN (GLUCOPHAGE) 1000 MG tablet TAKE 1/2 TABLET BY MOUTH TWICE A DAY FOR 1 WEEK THEN INCREASE TO 1 TABLET TWICE A DAY THEREAFTER (Patient not taking: Reported on 04/23/2021) 60 tablet 0   Polyethylene Glycol 400 (VISINE DRY EYE RELIEF OP) Apply to eye. (Patient not taking: Reported on 04/23/2021)     Vitamin D, Ergocalciferol, (DRISDOL) 1.25 MG (50000 UNIT) CAPS capsule TAKE 1 CAPSULE (50,000 UNITS TOTAL) BY MOUTH EVERY 7 (SEVEN) DAYS. (Patient not taking: Reported on 04/23/2021) 4 capsule 0   No current facility-administered medications on file prior to visit.    Allergies  Allergen Reactions   Latex Rash and Itching    Social History   Socioeconomic History   Marital status: Single    Spouse name: Not on file   Number of children: Not on file   Years of education: Not on file   Highest education level: Not on file  Occupational History   Occupation: Customer Care Rep  Tobacco Use   Smoking status: Some Days    Packs/day: 0.25    Years: 16.00    Pack years: 4.00    Types: Cigarettes   Smokeless tobacco: Never  Substance and Sexual Activity   Alcohol use: No    Comment: occasional    Drug use: No   Sexual activity: Yes    Birth control/protection: None  Other Topics Concern   Not on file  Social History Narrative   Not on file   Social Determinants of Health   Financial Resource Strain: Not on file  Food Insecurity: Not on file  Transportation Needs: Not on file  Physical Activity: Not on file  Stress: Not on file  Social Connections: Not on file  Intimate Partner Violence: Not on file    Family History  Problem Relation Age of Onset   Anesthesia problems Son        son-had T&A-2008-had trismus   Diabetes Mother    Hypertension Mother    Hyperlipidemia Mother    Arthritis Mother    Anxiety  disorder Mother    Depression Mother    Hypertension Father    Anxiety disorder Father    Hypertension Paternal Grandmother    Hyperlipidemia Paternal Grandmother     The following portions of the patient's history were reviewed and updated as appropriate: allergies, current medications, past family history, past medical history, past social history, past surgical history and problem  list.  Review of Systems Pertinent items are noted in HPI.   Objective:  BP (!) 155/88   Pulse 77   Ht _0  (1.702 m)   Wt 266 lb (120.7 kg)   BMI 41.66 kg/m  Wt Readings from Last 3 Encounters:  04/23/21 266 lb (120.7 kg)  06/06/20 273 lb (123.8 kg)  05/23/20 274 lb (124.3 kg)     Chaperone present during exam  CONSTITUTIONAL: Well-developed, well-nourished female in no acute distress.  HENT:  Normocephalic, atraumatic, External right and left ear normal. Oropharynx is clear and moist EYES: Conjunctivae and EOM are normal. Pupils are equal, round, and reactive to light. No scleral icterus.  NECK: Normal range of motion, supple, no masses.  Normal thyroid.   CARDIOVASCULAR: Normal heart rate noted, regular rhythm RESPIRATORY: Clear to auscultation bilaterally. Effort and breath sounds normal, no problems with respiration noted. BREASTS: Symmetric in size. No masses, skin changes, nipple drainage, or lymphadenopathy. ABDOMEN: Soft, normal bowel sounds, no distention noted.  No tenderness, rebound or guarding.  PELVIC: Normal appearing external genitalia; normal appearing vaginal mucosa and cervix.  No abnormal discharge noted.  Normal uterine size, no other palpable masses, no uterine or adnexal tenderness. MUSCULOSKELETAL: Normal range of motion. No tenderness.  No cyanosis, clubbing, or edema.  2+ distal pulses. SKIN: Skin is warm and dry. No rash noted. Not diaphoretic. No erythema. No pallor. NEUROLOGIC: Alert and oriented to person, place, and time. Normal reflexes, muscle tone coordination.  No cranial nerve deficit noted. PSYCHIATRIC: Normal mood and affect. Normal behavior. Normal judgment and thought content.  Assessment:  Annual gynecologic examination with pap smear   Plan:  1. Well Woman Exam Will follow up results of pap smear and manage accordingly. Requested PAP today Mammogram scheduled STD testing discussed. Patient requested testing - Cytology - PAP( [facility]) - TSH - Hemoglobin A1c - Comp Met (CMET) - CBC  2. Encounter for screening mammogram for malignant neoplasm of breast - MS Digital Screening; Future  3. Encounter for colorectal cancer screening - Cologuard  4. Vaginal spotting Will check [place]. May need biopsy - US PELVIC COMPLETE WITH TRANSVAGINAL; Future  5. Mixed diabetic hyperlipidemia associated with type 2 diabetes mellitus (HCC) Check HgA1c  6. Hypertension associated with diabetes ([place]) BP elevated today. Patient counseled to see PCP   Routine preventative health maintenance measures emphasized. Please refer to After Visit Summary for other counseling recommendations.    Loma [place], [employer] for [employer]

## 2021-04-24 LAB — COMPREHENSIVE METABOLIC PANEL
ALT: 8 IU/L (ref 0–32)
AST: 10 IU/L (ref 0–40)
Albumin/Globulin Ratio: 1.2 (ref 1.2–2.2)
Albumin: 3.8 g/dL (ref 3.8–4.8)
Alkaline Phosphatase: 87 IU/L (ref 44–121)
BUN/Creatinine Ratio: 8 — ABNORMAL LOW (ref 9–23)
BUN: 6 mg/dL (ref 6–24)
Bilirubin Total: 0.3 mg/dL (ref 0.0–1.2)
CO2: 22 mmol/L (ref 20–29)
Calcium: 9 mg/dL (ref 8.7–10.2)
Chloride: 103 mmol/L (ref 96–106)
Creatinine, Ser: 0.78 mg/dL (ref 0.57–1.00)
Globulin, Total: 3.1 g/dL (ref 1.5–4.5)
Glucose: 154 mg/dL — ABNORMAL HIGH (ref 65–99)
Potassium: 3.8 mmol/L (ref 3.5–5.2)
Sodium: 138 mmol/L (ref 134–144)
Total Protein: 6.9 g/dL (ref 6.0–8.5)
eGFR: 95 mL/min/{1.73_m2} (ref >59)

## 2021-04-24 LAB — CBC
Hematocrit: 34.8 % (ref 34.0–46.6)
Hemoglobin: 10.5 g/dL — ABNORMAL LOW (ref 11.1–15.9)
MCH: 21.8 pg — ABNORMAL LOW (ref 26.6–33.0)
MCHC: 30.2 g/dL — ABNORMAL LOW (ref 31.5–35.7)
MCV: 72 fL — ABNORMAL LOW (ref 79–97)
Platelets: 449 10*3/uL (ref 150–450)
RBC: 4.81 x10E6/uL (ref 3.77–5.28)
RDW: 15.4 % (ref 11.7–15.4)
WBC: 13.1 10*3/uL — ABNORMAL HIGH (ref 3.4–10.8)

## 2021-04-24 LAB — HEMOGLOBIN A1C
Est. average glucose Bld gHb Est-mCnc: 163 mg/dL
Hgb A1c MFr Bld: 7.3 % — ABNORMAL HIGH (ref 4.8–5.6)

## 2021-04-24 LAB — TSH: TSH: 2.03 u[IU]/mL (ref 0.450–4.500)

## 2021-04-25 LAB — CYTOLOGY - PAP
Chlamydia: NEGATIVE
Comment: NEGATIVE
Comment: NEGATIVE
Comment: NORMAL
Diagnosis: NEGATIVE
High risk HPV: NEGATIVE
Neisseria Gonorrhea: NEGATIVE

## 2021-04-29 ENCOUNTER — Telehealth: Payer: Self-pay

## 2021-04-29 DIAGNOSIS — A599 Trichomoniasis, unspecified: Secondary | ICD-10-CM

## 2021-04-29 MED ORDER — METRONIDAZOLE 500 MG PO TABS: 500.0000 mg | ORAL_TABLET | Freq: Two times a day (BID) | ORAL | 0 refills | Status: DC

## 2021-04-29 NOTE — Telephone Encounter (Signed)
Pt sent Mychart message requesting a phone call to discuss positive Trich results.Pt made aware that her Pap smear was WNL but it shows that she has Trich. Flagyl 500 mg BID x 7 days was sent to her pharmacy. Advised pt to have partner treated and to not have sex for at least 7 days after she and partner have been treated.Pt also advised to f/u with PCP for elevated Hemoglobin A1C. STD [facility] was [facility] to [facility]. Understanding was voiced. Atisha Hamidi l Anye Brose, CMA

## 2021-05-20 ENCOUNTER — Ambulatory Visit (HOSPITAL_BASED_OUTPATIENT_CLINIC_OR_DEPARTMENT_OTHER): Payer: BC Managed Care – PPO

## 2021-05-20 ENCOUNTER — Ambulatory Visit (HOSPITAL_BASED_OUTPATIENT_CLINIC_OR_DEPARTMENT_OTHER): Admission: RE | Admit: 2021-05-20 | Payer: BC Managed Care – PPO | Source: Ambulatory Visit

## 2021-05-20 ENCOUNTER — Telehealth (HOSPITAL_BASED_OUTPATIENT_CLINIC_OR_DEPARTMENT_OTHER): Payer: Self-pay

## 2021-06-05 DIAGNOSIS — Z1211 Encounter for screening for malignant neoplasm of colon: Secondary | ICD-10-CM | POA: Diagnosis not present

## 2021-06-05 DIAGNOSIS — Z1212 Encounter for screening for malignant neoplasm of rectum: Secondary | ICD-10-CM | POA: Diagnosis not present

## 2021-06-11 LAB — COLOGUARD: Cologuard: NEGATIVE

## 2021-07-20 NOTE — Progress Notes (Deleted)
[place] at [facility] [address] [place], [place] [address] [phone]  Date:  07/23/2021   Name:  Teresa Perez   DOB:  1976/05/18   MRN:  [id number]  PCP:  Darreld Mclean, MD    Chief Complaint: No chief complaint on file.   History of Present Illness:  Teresa Perez is a 45 y.o. very pleasant female patient who presents with the following:  Pt seen today for a CPE Last visit with myself 6/21- Patient with history of obesity, diabetes, depression, PCOS  She has 2 adult children and 4 school age children   Seen by GYN in August - they did some labs for her  CMP ok, A1c as below, CBC showed microcytic anemia   She was attending the [facility] but last visit I see is from 10/21  Lab Results  Component Value Date   HGBA1C 7.3 (H) 04/23/2021    Covid series Pneumonia vaccine Hep C needed Eye exam Colon screening: Foot exam:   Urine micro due Flu shot  Patient Active Problem List   Diagnosis Date Noted   Anxious mood as adjustment reaction 06/06/2020   Hypertension associated with diabetes ([place]) 05/23/2020   Mixed diabetic hyperlipidemia associated with type 2 diabetes mellitus ([place]) 05/23/2020   GAD (generalized anxiety disorder) 05/23/2020   Adjustment reaction with anxiety and depression 07/29/2017   Borderline blood pressure 07/29/2017   Diabetes mellitus ([place]) 07/29/2017   Gestational diabetes mellitus (GDM), antepartum 08/17/2016   History of conization of cervix affecting pregnancy, antepartum 08/05/2016   Vitamin D deficiency 01/09/2014   Depression 01/02/2014   PCOS (polycystic ovarian syndrome) 01/02/2014   Anemia 06/24/2013   Severe obesity (BMI >= 40) ([place]) 06/22/2013    Past Medical History:  Diagnosis Date   Acute bacterial bronchitis 11/05/2015   Anemia    Anxiety    Asthma    childhood asthma   Back pain    Boil    tendency to form boils   Depression    has been  treated in past-no present meds   Family history of anesthesia complication 9794   son- age 82 - "had lockjaw" after surgery (T&A)   Gestational diabetes mellitus (GDM), antepartum 08/17/2016   Fasting on 2 hour was 107   Headache(784.0)    Hyperglycemia    Hypertriglyceridemia    Menorrhagia 10/29/2015   Mixed diabetic hyperlipidemia associated with type 2 diabetes mellitus ([place]) 05/23/2020   PCOS (polycystic ovarian syndrome)    Pneumonia    AS A CHILD   Prediabetes     Past Surgical History:  Procedure Laterality Date   CERVICAL CONIZATION W/BX N/A 10/31/2015   Procedure: CONIZATION CERVIX WITH BIOPSY;  Surgeon: Eldred Manges, MD;  Location: [facility];  Service: Gynecology;  Laterality: N/A;   CESAREAN SECTION     x2 previous   CESAREAN SECTION  07/03/2011   Procedure: CESAREAN SECTION;  Surgeon: Alwyn Pea, MD;  Location: [facility];  Service: Gynecology;  Laterality: N/A;   CESAREAN SECTION N/A 06/22/2013   Procedure: CESAREAN SECTION, repeat;  Surgeon: Eldred Manges, MD;  Location: [facility];  Service: Obstetrics;  Laterality: N/A;   CESAREAN SECTION WITH BILATERAL TUBAL LIGATION Bilateral 02/14/2017   Procedure: CESAREAN SECTION WITH BILATERAL TUBAL LIGATION;  Surgeon: Osborne Oman, MD;  Location: [facility];  Service: Obstetrics;  Laterality: Bilateral;   IUD REMOVAL N/A 10/31/2015   Procedure:  INTRAUTERINE DEVICE (IUD) REMOVAL;  Surgeon: Eldred Manges, MD;  Location: [facility];  Service: Gynecology;  Laterality: N/A;   MOUTH SURGERY  2004    Social History   Tobacco Use   Smoking status: Some Days    Packs/day: 0.25    Years: 16.00    Pack years: 4.00    Types: Cigarettes   Smokeless tobacco: Never  Substance Use Topics   Alcohol use: No    Comment: occasional    Drug use: No    Family History  Problem Relation Age of Onset   Anesthesia problems Son        son-had T&A-2008-had trismus   Diabetes Mother    Hypertension Mother    Hyperlipidemia  Mother    Arthritis Mother    Anxiety disorder Mother    Depression Mother    Hypertension Father    Anxiety disorder Father    Hypertension Paternal Grandmother    Hyperlipidemia Paternal Grandmother     Allergies  Allergen Reactions   Latex Rash and Itching    Medication list has been reviewed and updated.  Current Outpatient Medications on File Prior to Visit  Medication Sig Dispense Refill   ALPRAZolam (XANAX) 0.5 MG tablet TAKE 1/2 TABLETS BY MOUTH 2 TIMES A DAY AS NEEDED FOR ANXIETY (Patient not taking: Reported on 04/23/2021) 30 tablet 1   atorvastatin (LIPITOR) 20 MG tablet TAKE 1 TABLET (20 MG TOTAL) BY MOUTH AT BEDTIME. (Patient not taking: Reported on 04/23/2021) 30 tablet 0   buPROPion (WELLBUTRIN SR) 150 MG 12 hr tablet TAKE 1 TABLET (150 MG TOTAL) BY MOUTH 2 (TWO) TIMES DAILY. (Patient not taking: Reported on 04/23/2021) 60 tablet 0   ferrous sulfate 325 (65 FE) MG EC tablet Take 1 tablet (325 mg total) by mouth 2 (two) times daily with a meal. (Patient not taking: Reported on 04/23/2021) 60 tablet 0   ferrous sulfate 325 (65 FE) MG tablet TAKE 1 TABLET (325 MG TOTAL) BY MOUTH 2 (TWO) TIMES DAILY WITH A MEAL. (Patient not taking: Reported on 04/23/2021) 100 tablet 0   fexofenadine (ALLEGRA) 180 MG tablet Take 180 mg by mouth daily. (Patient not taking: Reported on 04/23/2021)     ibuprofen (ADVIL) 200 MG tablet Take 200 mg by mouth every 6 (six) hours as needed. (Patient not taking: Reported on 04/23/2021)     metFORMIN (GLUCOPHAGE) 1000 MG tablet TAKE 1/2 TABLET BY MOUTH TWICE A DAY FOR 1 WEEK THEN INCREASE TO 1 TABLET TWICE A DAY THEREAFTER (Patient not taking: Reported on 04/23/2021) 60 tablet 0   metroNIDAZOLE (FLAGYL) 500 MG tablet Take 1 tablet (500 mg total) by mouth 2 (two) times daily. 14 tablet 0   Polyethylene Glycol 400 (VISINE DRY EYE RELIEF OP) Apply to eye. (Patient not taking: Reported on 04/23/2021)     No current facility-administered medications on file prior to  visit.    Review of Systems:  As per HPI- otherwise negative.   Physical Examination: There were no vitals filed for this visit. There were no vitals filed for this visit. There is no height or weight on file to calculate BMI. Ideal Body Weight:    GEN: no acute distress. HEENT: Atraumatic, Normocephalic.  Ears and Nose: No external deformity. CV: RRR, No M/G/R. No JVD. No thrill. No extra heart sounds. PULM: CTA B, no wheezes, crackles, rhonchi. No retractions. No resp. distress. No accessory muscle use. ABD: S, NT, ND, +BS. No rebound. No HSM. EXTR: No c/c/e PSYCH:  Normally interactive. Conversant.    Assessment and Plan: ***  Signed Lamar Blinks, MD

## 2021-07-20 NOTE — Patient Instructions (Incomplete)
Good to see you again today! I will be in touch with your labs asap Assuming all is well please see me in about 6 months

## 2021-07-23 ENCOUNTER — Encounter: Payer: BC Managed Care – PPO | Admitting: Family Medicine

## 2021-07-23 DIAGNOSIS — R030 Elevated blood-pressure reading, without diagnosis of hypertension: Secondary | ICD-10-CM

## 2021-07-23 DIAGNOSIS — E559 Vitamin D deficiency, unspecified: Secondary | ICD-10-CM

## 2021-07-23 DIAGNOSIS — E1169 Type 2 diabetes mellitus with other specified complication: Secondary | ICD-10-CM

## 2021-07-23 DIAGNOSIS — Z1159 Encounter for screening for other viral diseases: Secondary | ICD-10-CM

## 2021-07-23 DIAGNOSIS — D509 Iron deficiency anemia, unspecified: Secondary | ICD-10-CM

## 2021-08-08 NOTE — Patient Instructions (Incomplete)
It was good to see you again today, I will be in touch with your labs are soon as possible

## 2021-08-08 NOTE — Progress Notes (Deleted)
[place] at [facility] [address] [place], [place] [address] [phone]  Date:  08/13/2021   Name:  Teresa Perez   DOB:  Aug 26, 1976   MRN:  [id number]  PCP:  Darreld Mclean, MD    Chief Complaint: No chief complaint on file.   History of Present Illness:  Teresa Perez is a 45 y.o. very pleasant female patient who presents with the following:  Patient is seen today for physical exam- history of obesity, diabetes, depression, PCOS Most recent visit with myself June of last year She has 2 young adult children and 4 school-aged children At her visit last year she had run out or stopped taking all of her medications-A1c was elevated, I refilled her metformin and also recommended an iron supplement. She occasionally needs alprazolam for anxiety but has not filled it in over a year She saw her her gynecologist in October who did order some routine lab work-CMP, CBC which showed anemia, A1c at 7.3%, TSH normal  COVID-19 series Flu vaccine Pneumonia vaccine Needs hepatitis C screening Eye exam Colon cancer screening-negative Cologuard done October Foot exam is due Urine microalbumin Mammogram done last year Patient Active Problem List   Diagnosis Date Noted   Anxious mood as adjustment reaction 06/06/2020   Hypertension associated with diabetes ([place]) 05/23/2020   Mixed diabetic hyperlipidemia associated with type 2 diabetes mellitus ([place]) 05/23/2020   GAD (generalized anxiety disorder) 05/23/2020   Adjustment reaction with anxiety and depression 07/29/2017   Borderline blood pressure 07/29/2017   Diabetes mellitus ([place]) 07/29/2017   Gestational diabetes mellitus (GDM), antepartum 08/17/2016   History of conization of cervix affecting pregnancy, antepartum 08/05/2016   Vitamin D deficiency 01/09/2014   Depression 01/02/2014   PCOS (polycystic ovarian syndrome) 01/02/2014   Anemia 06/24/2013   Severe obesity (BMI >= 40)  ([place]) 06/22/2013    Past Medical History:  Diagnosis Date   Acute bacterial bronchitis 11/05/2015   Anemia    Anxiety    Asthma    childhood asthma   Back pain    Boil    tendency to form boils   Depression    has been treated in past-no present meds   Family history of anesthesia complication 9381   son- age 64 - "had lockjaw" after surgery (T&A)   Gestational diabetes mellitus (GDM), antepartum 08/17/2016   Fasting on 2 hour was 107   Headache(784.0)    Hyperglycemia    Hypertriglyceridemia    Menorrhagia 10/29/2015   Mixed diabetic hyperlipidemia associated with type 2 diabetes mellitus ([place]) 05/23/2020   PCOS (polycystic ovarian syndrome)    Pneumonia    AS A CHILD   Prediabetes     Past Surgical History:  Procedure Laterality Date   CERVICAL CONIZATION W/BX N/A 10/31/2015   Procedure: CONIZATION CERVIX WITH BIOPSY;  Surgeon: Eldred Manges, MD;  Location: [facility];  Service: Gynecology;  Laterality: N/A;   CESAREAN SECTION     x2 previous   CESAREAN SECTION  07/03/2011   Procedure: CESAREAN SECTION;  Surgeon: Alwyn Pea, MD;  Location: [facility];  Service: Gynecology;  Laterality: N/A;   CESAREAN SECTION N/A 06/22/2013   Procedure: CESAREAN SECTION, repeat;  Surgeon: Eldred Manges, MD;  Location: [facility];  Service: Obstetrics;  Laterality: N/A;   CESAREAN SECTION WITH BILATERAL TUBAL LIGATION Bilateral 02/14/2017   Procedure: CESAREAN SECTION WITH BILATERAL TUBAL LIGATION;  Surgeon: Osborne Oman, MD;  Location:  [facility];  Service: Obstetrics;  Laterality: Bilateral;   IUD REMOVAL N/A 10/31/2015   Procedure: INTRAUTERINE DEVICE (IUD) REMOVAL;  Surgeon: Eldred Manges, MD;  Location: [facility];  Service: Gynecology;  Laterality: N/A;   MOUTH SURGERY  2004    Social History   Tobacco Use   Smoking status: Some Days    Packs/day: 0.25    Years: 16.00    Pack years: 4.00    Types: Cigarettes   Smokeless tobacco: Never  Substance Use Topics   Alcohol  use: No    Comment: occasional    Drug use: No    Family History  Problem Relation Age of Onset   Anesthesia problems Son        son-had T&A-2008-had trismus   Diabetes Mother    Hypertension Mother    Hyperlipidemia Mother    Arthritis Mother    Anxiety disorder Mother    Depression Mother    Hypertension Father    Anxiety disorder Father    Hypertension Paternal Grandmother    Hyperlipidemia Paternal Grandmother     Allergies  Allergen Reactions   Latex Rash and Itching    Medication list has been reviewed and updated.  Current Outpatient Medications on File Prior to Visit  Medication Sig Dispense Refill   ALPRAZolam (XANAX) 0.5 MG tablet TAKE 1/2 TABLETS BY MOUTH 2 TIMES A DAY AS NEEDED FOR ANXIETY (Patient not taking: Reported on 04/23/2021) 30 tablet 1   atorvastatin (LIPITOR) 20 MG tablet TAKE 1 TABLET (20 MG TOTAL) BY MOUTH AT BEDTIME. (Patient not taking: Reported on 04/23/2021) 30 tablet 0   buPROPion (WELLBUTRIN SR) 150 MG 12 hr tablet TAKE 1 TABLET (150 MG TOTAL) BY MOUTH 2 (TWO) TIMES DAILY. (Patient not taking: Reported on 04/23/2021) 60 tablet 0   ferrous sulfate 325 (65 FE) MG EC tablet Take 1 tablet (325 mg total) by mouth 2 (two) times daily with a meal. (Patient not taking: Reported on 04/23/2021) 60 tablet 0   ferrous sulfate 325 (65 FE) MG tablet TAKE 1 TABLET (325 MG TOTAL) BY MOUTH 2 (TWO) TIMES DAILY WITH A MEAL. (Patient not taking: Reported on 04/23/2021) 100 tablet 0   fexofenadine (ALLEGRA) 180 MG tablet Take 180 mg by mouth daily. (Patient not taking: Reported on 04/23/2021)     ibuprofen (ADVIL) 200 MG tablet Take 200 mg by mouth every 6 (six) hours as needed. (Patient not taking: Reported on 04/23/2021)     metFORMIN (GLUCOPHAGE) 1000 MG tablet TAKE 1/2 TABLET BY MOUTH TWICE A DAY FOR 1 WEEK THEN INCREASE TO 1 TABLET TWICE A DAY THEREAFTER (Patient not taking: Reported on 04/23/2021) 60 tablet 0   metroNIDAZOLE (FLAGYL) 500 MG tablet Take 1 tablet (500 mg  total) by mouth 2 (two) times daily. 14 tablet 0   Polyethylene Glycol 400 (VISINE DRY EYE RELIEF OP) Apply to eye. (Patient not taking: Reported on 04/23/2021)     No current facility-administered medications on file prior to visit.    Review of Systems:  As per HPI- otherwise negative.   Physical Examination: There were no vitals filed for this visit. There were no vitals filed for this visit. There is no height or weight on file to calculate BMI. Ideal Body Weight:    GEN: no acute distress. HEENT: Atraumatic, Normocephalic.  Ears and Nose: No external deformity. CV: RRR, No M/G/R. No JVD. No thrill. No extra heart sounds. PULM: CTA B, no wheezes, crackles, rhonchi. No retractions. No resp.  distress. No accessory muscle use. ABD: S, NT, ND, +BS. No rebound. No HSM. EXTR: No c/c/e PSYCH: Normally interactive. Conversant.    Assessment and Plan: *** Physical exam today.  Encouraged healthy diet and exercise routine Will plan further follow- up pending labs.  Signed Lamar Blinks, MD

## 2021-08-13 ENCOUNTER — Encounter: Payer: BC Managed Care – PPO | Admitting: Family Medicine

## 2021-08-13 DIAGNOSIS — R5383 Other fatigue: Secondary | ICD-10-CM

## 2021-08-13 DIAGNOSIS — E1169 Type 2 diabetes mellitus with other specified complication: Secondary | ICD-10-CM

## 2021-08-13 DIAGNOSIS — Z0000 Encounter for general adult medical examination without abnormal findings: Secondary | ICD-10-CM

## 2021-08-13 DIAGNOSIS — D509 Iron deficiency anemia, unspecified: Secondary | ICD-10-CM

## 2021-08-13 DIAGNOSIS — E559 Vitamin D deficiency, unspecified: Secondary | ICD-10-CM

## 2021-08-13 DIAGNOSIS — Z1159 Encounter for screening for other viral diseases: Secondary | ICD-10-CM

## 2021-08-31 NOTE — Progress Notes (Deleted)
[place] at [facility] [address], [place], [place] [address] [phone]  Date:  09/03/2021   Name:  Teresa Perez   DOB:  September 24, 1975   MRN:  [id number]  PCP:  Darreld Mclean, MD    Chief Complaint: No chief complaint on file.   History of Present Illness:  Teresa Perez is a 46 y.o. very pleasant female patient who presents with the following:  Pt seen today for a CPE Last visit with myself 01/2020- Patient with history of obesity, diabetes, depression, PCOS.  She has 2 adult children and 4 school age children  Seen by her OBG in August  ? [place] seeing weight and wellness  Lab Results  Component Value Date   HGBA1C 7.3 (H) 04/23/2021     Covid series Pneumonia vaccine Hep C screening due Eye exam Colon cancer screening  Due for foot exam and urine microalbumin Flu vaccine  Some labs done in August  Patient Active Problem List   Diagnosis Date Noted   Anxious mood as adjustment reaction 06/06/2020   Hypertension associated with diabetes ([place]) 05/23/2020   Mixed diabetic hyperlipidemia associated with type 2 diabetes mellitus ([place]) 05/23/2020   GAD (generalized anxiety disorder) 05/23/2020   Adjustment reaction with anxiety and depression 07/29/2017   Borderline blood pressure 07/29/2017   Diabetes mellitus ([place]) 07/29/2017   Gestational diabetes mellitus (GDM), antepartum 08/17/2016   History of conization of cervix affecting pregnancy, antepartum 08/05/2016   Vitamin D deficiency 01/09/2014   Depression 01/02/2014   PCOS (polycystic ovarian syndrome) 01/02/2014   Anemia 06/24/2013   Severe obesity (BMI >= 40) ([place]) 06/22/2013    Past Medical History:  Diagnosis Date   Acute bacterial bronchitis 11/05/2015   Anemia    Anxiety    Asthma    childhood asthma   Back pain    Boil    tendency to form boils   Depression    has been treated in past-no present meds   Family history of anesthesia complication  4628   son- age 41 - "had lockjaw" after surgery (T&A)   Gestational diabetes mellitus (GDM), antepartum 08/17/2016   Fasting on 2 hour was 107   Headache(784.0)    Hyperglycemia    Hypertriglyceridemia    Menorrhagia 10/29/2015   Mixed diabetic hyperlipidemia associated with type 2 diabetes mellitus ([place]) 05/23/2020   PCOS (polycystic ovarian syndrome)    Pneumonia    AS A CHILD   Prediabetes     Past Surgical History:  Procedure Laterality Date   CERVICAL CONIZATION W/BX N/A 10/31/2015   Procedure: CONIZATION CERVIX WITH BIOPSY;  Surgeon: Eldred Manges, MD;  Location: [facility];  Service: Gynecology;  Laterality: N/A;   CESAREAN SECTION     x2 previous   CESAREAN SECTION  07/03/2011   Procedure: CESAREAN SECTION;  Surgeon: Alwyn Pea, MD;  Location: [facility];  Service: Gynecology;  Laterality: N/A;   CESAREAN SECTION N/A 06/22/2013   Procedure: CESAREAN SECTION, repeat;  Surgeon: Eldred Manges, MD;  Location: [facility];  Service: Obstetrics;  Laterality: N/A;   CESAREAN SECTION WITH BILATERAL TUBAL LIGATION Bilateral 02/14/2017   Procedure: CESAREAN SECTION WITH BILATERAL TUBAL LIGATION;  Surgeon: Osborne Oman, MD;  Location: [facility];  Service: Obstetrics;  Laterality: Bilateral;   IUD REMOVAL N/A 10/31/2015   Procedure: INTRAUTERINE DEVICE (IUD) REMOVAL;  Surgeon: Eldred Manges, MD;  Location: [facility];  Service: Gynecology;  Laterality: N/A;   MOUTH SURGERY  2004    Social History   Tobacco Use   Smoking status: Some Days    Packs/day: 0.25    Years: 16.00    Pack years: 4.00    Types: Cigarettes   Smokeless tobacco: Never  Substance Use Topics   Alcohol use: No    Comment: occasional    Drug use: No    Family History  Problem Relation Age of Onset   Anesthesia problems Son        son-had T&A-2008-had trismus   Diabetes Mother    Hypertension Mother    Hyperlipidemia Mother    Arthritis Mother    Anxiety disorder Mother    Depression Mother     Hypertension Father    Anxiety disorder Father    Hypertension Paternal Grandmother    Hyperlipidemia Paternal Grandmother     Allergies  Allergen Reactions   Latex Rash and Itching    Medication list has been reviewed and updated.  Current Outpatient Medications on File Prior to Visit  Medication Sig Dispense Refill   ALPRAZolam (XANAX) 0.5 MG tablet TAKE 1/2 TABLETS BY MOUTH 2 TIMES A DAY AS NEEDED FOR ANXIETY (Patient not taking: Reported on 04/23/2021) 30 tablet 1   atorvastatin (LIPITOR) 20 MG tablet TAKE 1 TABLET (20 MG TOTAL) BY MOUTH AT BEDTIME. (Patient not taking: Reported on 04/23/2021) 30 tablet 0   buPROPion (WELLBUTRIN SR) 150 MG 12 hr tablet TAKE 1 TABLET (150 MG TOTAL) BY MOUTH 2 (TWO) TIMES DAILY. (Patient not taking: Reported on 04/23/2021) 60 tablet 0   ferrous sulfate 325 (65 FE) MG EC tablet Take 1 tablet (325 mg total) by mouth 2 (two) times daily with a meal. (Patient not taking: Reported on 04/23/2021) 60 tablet 0   ferrous sulfate 325 (65 FE) MG tablet TAKE 1 TABLET (325 MG TOTAL) BY MOUTH 2 (TWO) TIMES DAILY WITH A MEAL. (Patient not taking: Reported on 04/23/2021) 100 tablet 0   fexofenadine (ALLEGRA) 180 MG tablet Take 180 mg by mouth daily. (Patient not taking: Reported on 04/23/2021)     ibuprofen (ADVIL) 200 MG tablet Take 200 mg by mouth every 6 (six) hours as needed. (Patient not taking: Reported on 04/23/2021)     metFORMIN (GLUCOPHAGE) 1000 MG tablet TAKE 1/2 TABLET BY MOUTH TWICE A DAY FOR 1 WEEK THEN INCREASE TO 1 TABLET TWICE A DAY THEREAFTER (Patient not taking: Reported on 04/23/2021) 60 tablet 0   metroNIDAZOLE (FLAGYL) 500 MG tablet Take 1 tablet (500 mg total) by mouth 2 (two) times daily. 14 tablet 0   Polyethylene Glycol 400 (VISINE DRY EYE RELIEF OP) Apply to eye. (Patient not taking: Reported on 04/23/2021)     No current facility-administered medications on file prior to visit.    Review of Systems:  As per HPI- otherwise  negative.   Physical Examination: There were no vitals filed for this visit. There were no vitals filed for this visit. There is no height or weight on file to calculate BMI. Ideal Body Weight:    GEN: no acute distress. HEENT: Atraumatic, Normocephalic.  Ears and Nose: No external deformity. CV: RRR, No M/G/R. No JVD. No thrill. No extra heart sounds. PULM: CTA B, no wheezes, crackles, rhonchi. No retractions. No resp. distress. No accessory muscle use. ABD: S, NT, ND, +BS. No rebound. No HSM. EXTR: No c/c/e PSYCH: Normally interactive. Conversant.    Assessment and Plan: ***  Signed Lamar Blinks, MD

## 2021-08-31 NOTE — Patient Instructions (Incomplete)
Good to see you again today!

## 2021-09-03 ENCOUNTER — Encounter: Payer: BC Managed Care – PPO | Admitting: Family Medicine

## 2021-09-03 DIAGNOSIS — D509 Iron deficiency anemia, unspecified: Secondary | ICD-10-CM

## 2021-09-03 DIAGNOSIS — Z1329 Encounter for screening for other suspected endocrine disorder: Secondary | ICD-10-CM

## 2021-09-03 DIAGNOSIS — E559 Vitamin D deficiency, unspecified: Secondary | ICD-10-CM

## 2021-09-03 DIAGNOSIS — E1169 Type 2 diabetes mellitus with other specified complication: Secondary | ICD-10-CM

## 2021-09-03 DIAGNOSIS — R030 Elevated blood-pressure reading, without diagnosis of hypertension: Secondary | ICD-10-CM

## 2021-09-03 DIAGNOSIS — Z1159 Encounter for screening for other viral diseases: Secondary | ICD-10-CM

## 2022-02-24 DIAGNOSIS — Z0000 Encounter for general adult medical examination without abnormal findings: Secondary | ICD-10-CM | POA: Diagnosis not present

## 2022-02-24 DIAGNOSIS — E782 Mixed hyperlipidemia: Secondary | ICD-10-CM | POA: Diagnosis not present

## 2022-02-24 DIAGNOSIS — F3341 Major depressive disorder, recurrent, in partial remission: Secondary | ICD-10-CM | POA: Diagnosis not present

## 2022-02-24 DIAGNOSIS — F419 Anxiety disorder, unspecified: Secondary | ICD-10-CM | POA: Diagnosis not present

## 2022-02-24 DIAGNOSIS — E119 Type 2 diabetes mellitus without complications: Secondary | ICD-10-CM | POA: Diagnosis not present

## 2022-03-10 DIAGNOSIS — I10 Essential (primary) hypertension: Secondary | ICD-10-CM | POA: Diagnosis not present

## 2022-03-10 DIAGNOSIS — E119 Type 2 diabetes mellitus without complications: Secondary | ICD-10-CM | POA: Diagnosis not present

## 2022-03-10 DIAGNOSIS — D649 Anemia, unspecified: Secondary | ICD-10-CM | POA: Diagnosis not present

## 2022-03-10 DIAGNOSIS — E782 Mixed hyperlipidemia: Secondary | ICD-10-CM | POA: Diagnosis not present

## 2022-04-08 ENCOUNTER — Encounter (INDEPENDENT_AMBULATORY_CARE_PROVIDER_SITE_OTHER): Payer: Self-pay

## 2022-04-21 ENCOUNTER — Ambulatory Visit (HOSPITAL_BASED_OUTPATIENT_CLINIC_OR_DEPARTMENT_OTHER)
Admission: RE | Admit: 2022-04-21 | Discharge: 2022-04-21 | Disposition: A | Discharge status: Home / Self Care | Payer: BC Managed Care – PPO | Source: Ambulatory Visit | Attending: Family Medicine | Admitting: Family Medicine

## 2022-04-21 ENCOUNTER — Encounter (HOSPITAL_BASED_OUTPATIENT_CLINIC_OR_DEPARTMENT_OTHER): Payer: Self-pay

## 2022-04-21 DIAGNOSIS — Z1231 Encounter for screening mammogram for malignant neoplasm of breast: Secondary | ICD-10-CM | POA: Insufficient documentation

## 2022-04-24 ENCOUNTER — Ambulatory Visit: Payer: BC Managed Care – PPO | Admitting: Family Medicine

## 2022-04-24 DIAGNOSIS — E119 Type 2 diabetes mellitus without complications: Secondary | ICD-10-CM | POA: Diagnosis not present

## 2022-04-24 DIAGNOSIS — E782 Mixed hyperlipidemia: Secondary | ICD-10-CM | POA: Diagnosis not present

## 2022-04-24 DIAGNOSIS — I10 Essential (primary) hypertension: Secondary | ICD-10-CM | POA: Diagnosis not present

## 2022-04-24 DIAGNOSIS — Z113 Encounter for screening for infections with a predominantly sexual mode of transmission: Secondary | ICD-10-CM | POA: Diagnosis not present

## 2022-05-26 DIAGNOSIS — F411 Generalized anxiety disorder: Secondary | ICD-10-CM | POA: Diagnosis not present

## 2022-05-26 DIAGNOSIS — F331 Major depressive disorder, recurrent, moderate: Secondary | ICD-10-CM | POA: Diagnosis not present

## 2022-05-26 DIAGNOSIS — F4312 Post-traumatic stress disorder, chronic: Secondary | ICD-10-CM | POA: Diagnosis not present

## 2022-07-08 DIAGNOSIS — E119 Type 2 diabetes mellitus without complications: Secondary | ICD-10-CM | POA: Diagnosis not present

## 2022-07-08 DIAGNOSIS — E785 Hyperlipidemia, unspecified: Secondary | ICD-10-CM | POA: Diagnosis not present

## 2022-07-08 DIAGNOSIS — E1159 Type 2 diabetes mellitus with other circulatory complications: Secondary | ICD-10-CM | POA: Diagnosis not present

## 2022-07-08 DIAGNOSIS — E1169 Type 2 diabetes mellitus with other specified complication: Secondary | ICD-10-CM | POA: Diagnosis not present

## 2022-07-10 IMAGING — MG DIGITAL SCREENING BILAT W/ TOMO W/ CAD
8 of 15 series · 8 of 40 positions shown · non-contrast
Comparison: None.

CLINICAL DATA: Screening.

EXAM:
DIGITAL SCREENING BILATERAL MAMMOGRAM WITH TOMO AND CAD

[L MLO synth-2D]
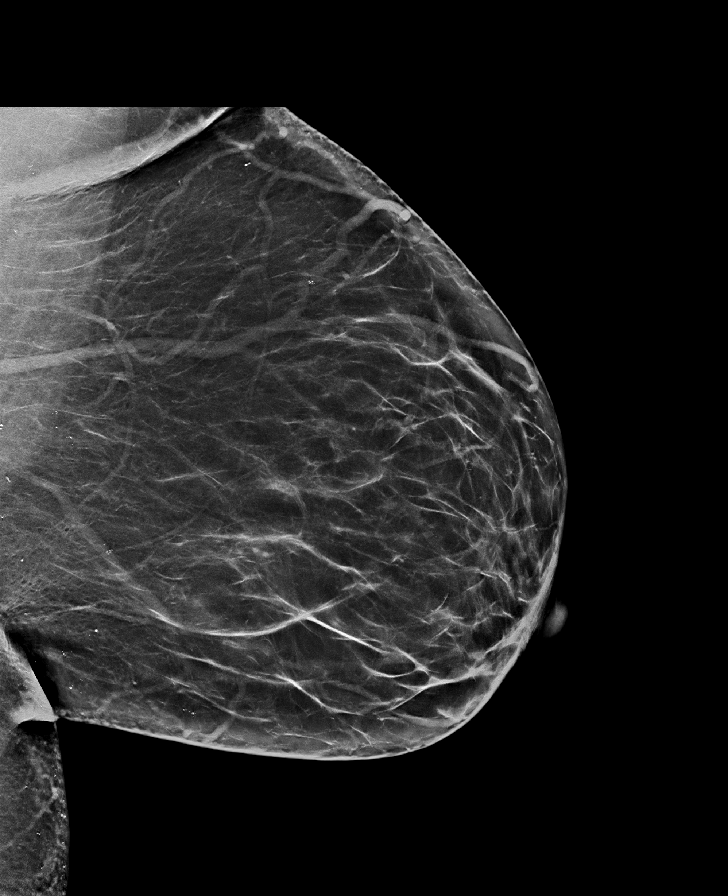

[L XCCL synth-2D]
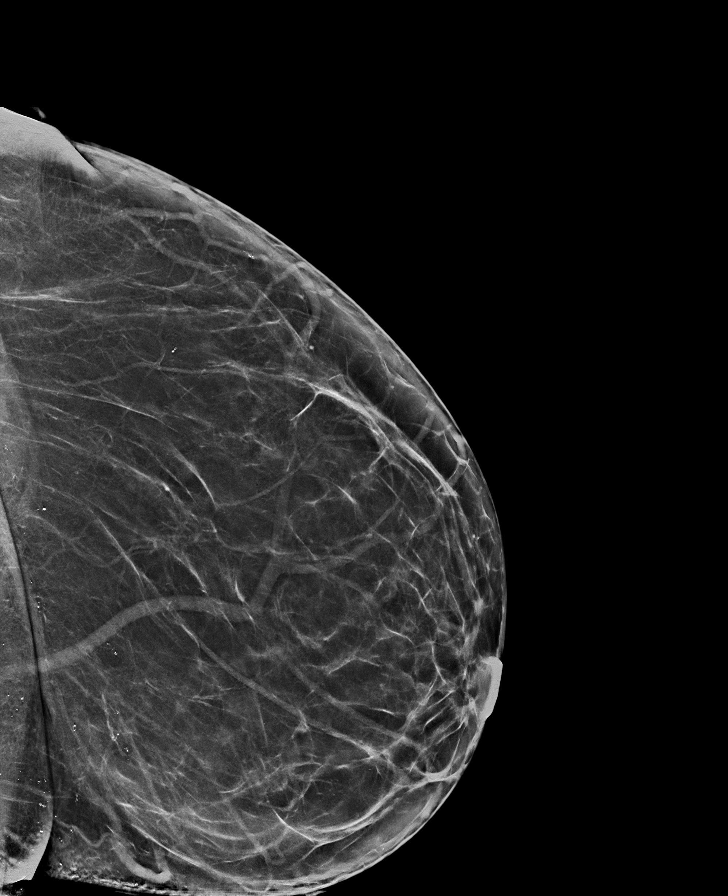

[R MLO synth-2D (1 of 2)]
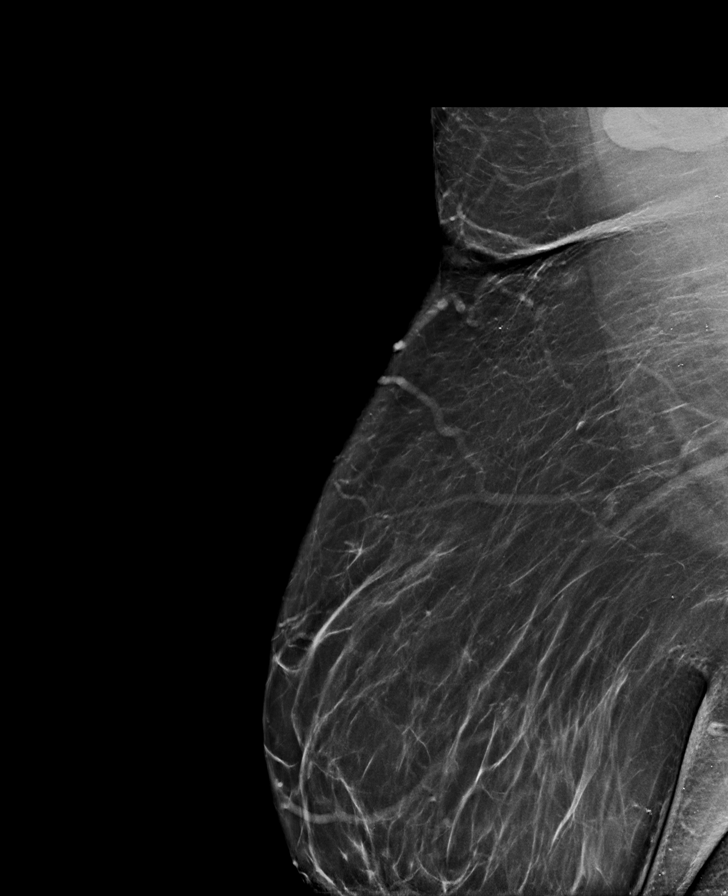

[R XCCL synth-2D]
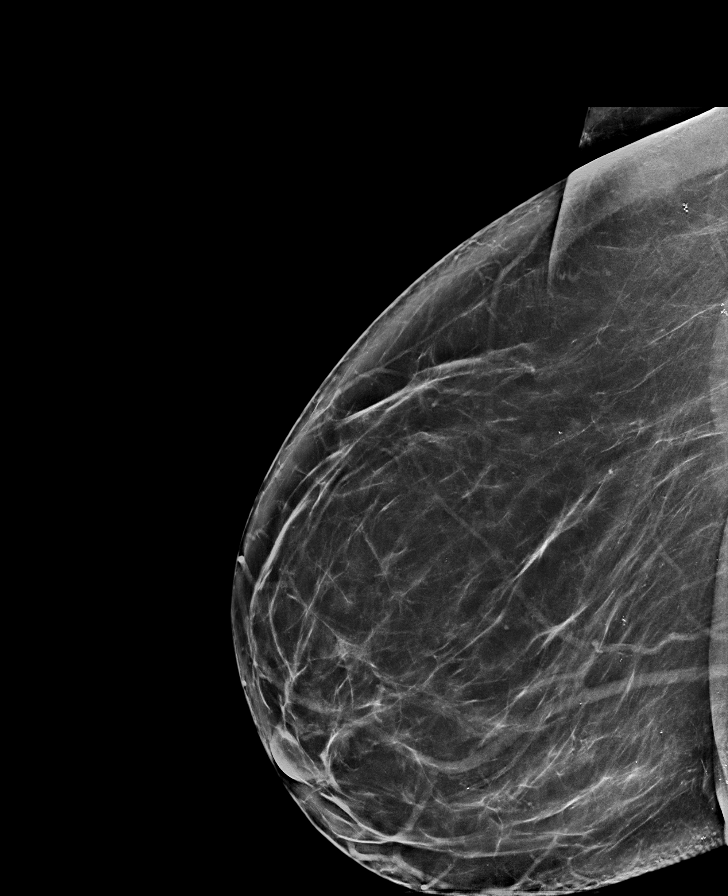

[L CC synth-2D]
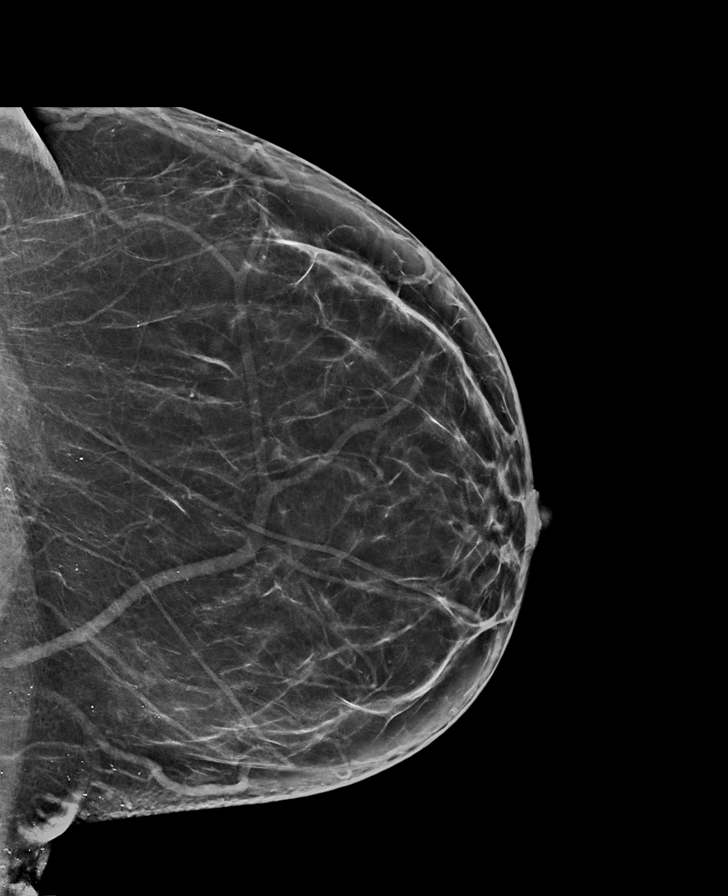

[R CC synth-2D]
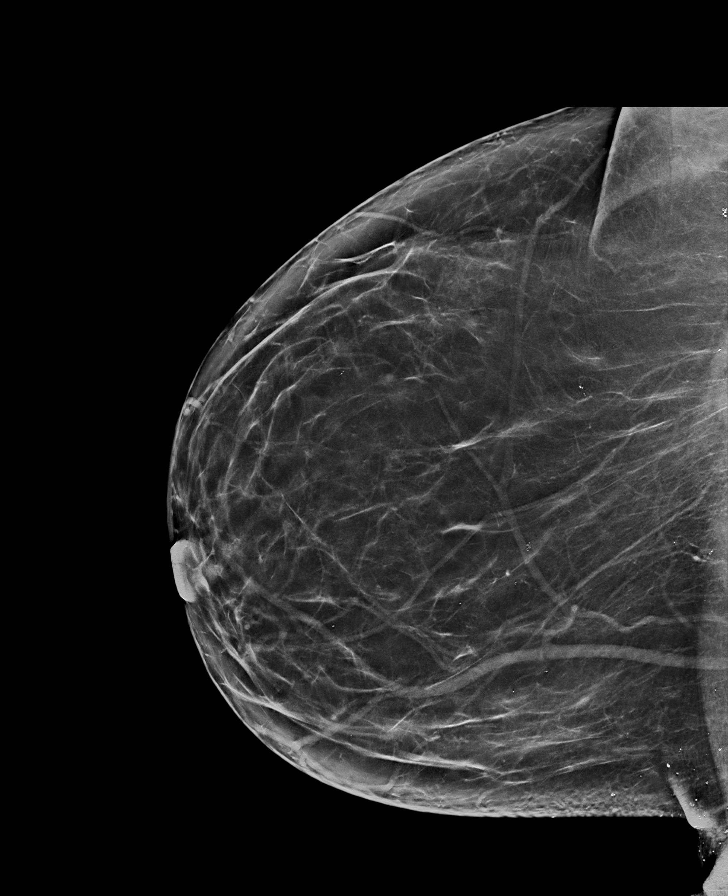

[R MLO synth-2D (2 of 2)]
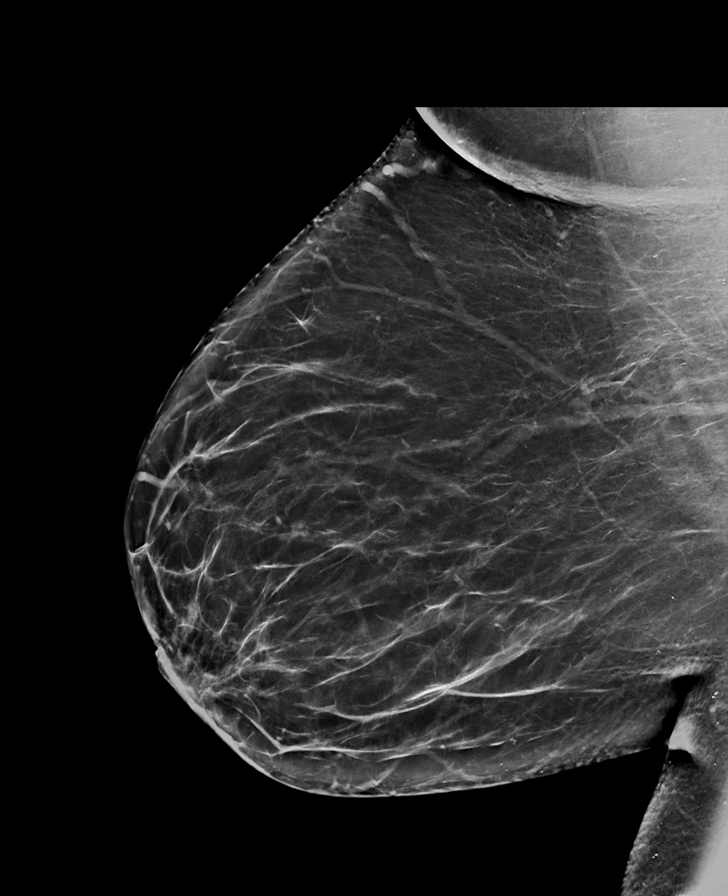

[L MLO tomo · tomo slice 59/86.0]
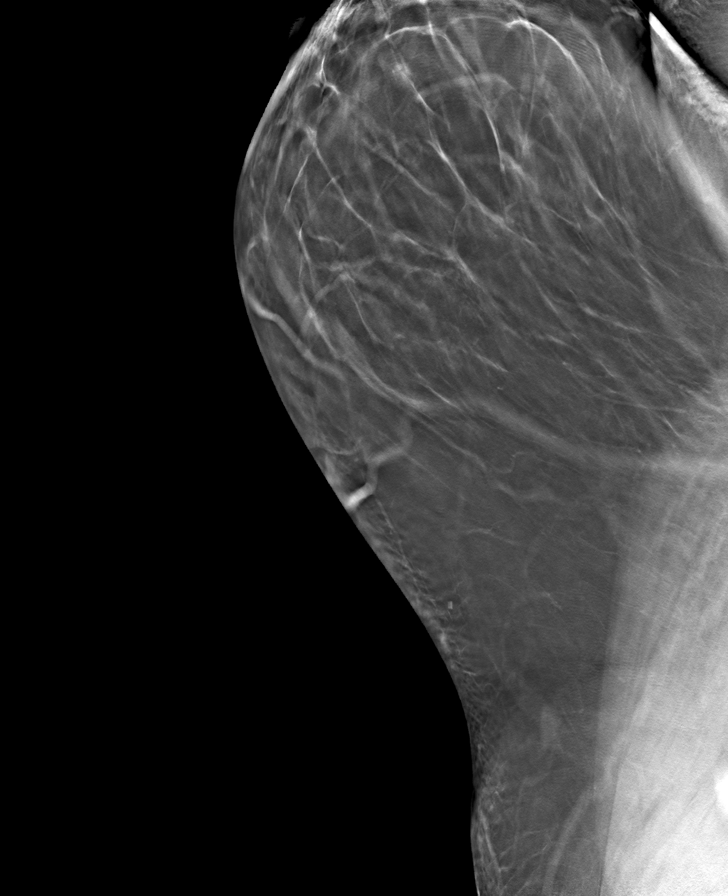

[8 of 40 positions shown; findings below may reference images not displayed]

ACR Breast Density Category b: There are scattered areas of
fibroglandular density.
FINDINGS: There are no findings suspicious for malignancy. Images were
processed with CAD.
IMPRESSION: No mammographic evidence of malignancy. A result letter of this
screening mammogram will be mailed directly to the patient.

RECOMMENDATION:
Screening mammogram in one year. (Code:Y5-G-EJ6)

BI-RADS CATEGORY  1: Negative.

## 2022-07-20 DIAGNOSIS — J111 Influenza due to unidentified influenza virus with other respiratory manifestations: Secondary | ICD-10-CM | POA: Diagnosis not present

## 2022-07-20 DIAGNOSIS — R509 Fever, unspecified: Secondary | ICD-10-CM | POA: Diagnosis not present

## 2022-07-20 DIAGNOSIS — R051 Acute cough: Secondary | ICD-10-CM | POA: Diagnosis not present

## 2022-09-02 DIAGNOSIS — F419 Anxiety disorder, unspecified: Secondary | ICD-10-CM | POA: Diagnosis not present

## 2022-09-02 DIAGNOSIS — E782 Mixed hyperlipidemia: Secondary | ICD-10-CM | POA: Diagnosis not present

## 2022-09-02 DIAGNOSIS — R053 Chronic cough: Secondary | ICD-10-CM | POA: Diagnosis not present

## 2022-09-02 DIAGNOSIS — F3341 Major depressive disorder, recurrent, in partial remission: Secondary | ICD-10-CM | POA: Diagnosis not present

## 2022-09-02 DIAGNOSIS — I10 Essential (primary) hypertension: Secondary | ICD-10-CM | POA: Diagnosis not present

## 2022-09-02 DIAGNOSIS — E119 Type 2 diabetes mellitus without complications: Secondary | ICD-10-CM | POA: Diagnosis not present

## 2022-09-02 DIAGNOSIS — L308 Other specified dermatitis: Secondary | ICD-10-CM | POA: Diagnosis not present

## 2022-10-17 ENCOUNTER — Other Ambulatory Visit: Payer: Self-pay

## 2022-10-17 ENCOUNTER — Emergency Department (HOSPITAL_BASED_OUTPATIENT_CLINIC_OR_DEPARTMENT_OTHER): Payer: BC Managed Care – PPO

## 2022-10-17 ENCOUNTER — Emergency Department (HOSPITAL_BASED_OUTPATIENT_CLINIC_OR_DEPARTMENT_OTHER)
Admission: EM | Admit: 2022-10-17 | Discharge: 2022-10-17 | Disposition: A | Discharge status: Home / Self Care | Payer: BC Managed Care – PPO | Attending: Emergency Medicine | Admitting: Emergency Medicine

## 2022-10-17 ENCOUNTER — Encounter (HOSPITAL_BASED_OUTPATIENT_CLINIC_OR_DEPARTMENT_OTHER): Payer: Self-pay

## 2022-10-17 DIAGNOSIS — Z91040 Latex allergy status: Secondary | ICD-10-CM | POA: Insufficient documentation

## 2022-10-17 DIAGNOSIS — J988 Other specified respiratory disorders: Secondary | ICD-10-CM | POA: Diagnosis not present

## 2022-10-17 DIAGNOSIS — Z0389 Encounter for observation for other suspected diseases and conditions ruled out: Secondary | ICD-10-CM | POA: Diagnosis not present

## 2022-10-17 DIAGNOSIS — Z20822 Contact with and (suspected) exposure to covid-19: Secondary | ICD-10-CM | POA: Diagnosis not present

## 2022-10-17 DIAGNOSIS — R062 Wheezing: Secondary | ICD-10-CM | POA: Diagnosis not present

## 2022-10-17 DIAGNOSIS — R0602 Shortness of breath: Secondary | ICD-10-CM | POA: Diagnosis not present

## 2022-10-17 LAB — RESP PANEL BY RT-PCR (RSV, FLU A&B, COVID)  RVPGX2
Influenza A by PCR: NEGATIVE
Influenza B by PCR: NEGATIVE
Resp Syncytial Virus by PCR: NEGATIVE
SARS Coronavirus 2 by RT PCR: NEGATIVE

## 2022-10-17 MED ORDER — ALBUTEROL SULFATE HFA 108 (90 BASE) MCG/ACT IN AERS
2.0000 | INHALATION_SPRAY | RESPIRATORY_TRACT | Status: DC | PRN
  Administered 2022-10-17: 2 via RESPIRATORY_TRACT
  Filled 2022-10-17: qty 6.7

## 2022-10-17 MED ORDER — DEXAMETHASONE 4 MG PO TABS
10.0000 mg | ORAL_TABLET | Freq: Once | ORAL | Status: AC
  Administered 2022-10-17: 10 mg via ORAL
  Filled 2022-10-17: qty 3

## 2022-10-17 NOTE — ED Triage Notes (Signed)
Pt c/o SHOB x "a couple days," worse today, esp w inspiration. Pt c/o centralized CP "like tightness." Endorses childhood hx of asthma, denies adult hx

## 2022-10-17 NOTE — Discharge Instructions (Signed)
Try to stop smoking if you can.  Now,

## 2022-10-17 NOTE — ED Notes (Signed)
RT at bedside for inhaler.

## 2022-10-17 NOTE — ED Notes (Signed)
RT assessed in triage. BBS slight rhonchi, but good air movement. "Croupy" congested cough. Stated she feels like she can't get secretions up. Also stated she had childhood history of asthma. SAT 98%. Will assess further as needed when she comes to room.

## 2022-10-18 NOTE — ED Provider Notes (Signed)
[facility] Provider Note   CSN: [id number] Arrival date & time: 10/17/22  1825     History  Chief Complaint  Patient presents with   Shortness of Breath   Chest Pain    Teresa Perez is a 47 y.o. female.  47 yo F with cough congestion.  Going on for since November seems to come and go.  She does have children that have been sick off and on as well.  She denies history of COPD but does have a remote history of asthma and also smokes occasionally.   Shortness of Breath Associated symptoms: chest pain   Chest Pain Associated symptoms: shortness of breath        Home Medications Prior to Admission medications   Medication Sig Start Date End Date Taking? Authorizing Provider  ALPRAZolam (XANAX) 0.5 MG tablet TAKE 1/2 TABLETS BY MOUTH 2 TIMES A DAY AS NEEDED FOR ANXIETY Patient not taking: Reported on 04/23/2021 02/05/20   Copland, Gay Filler, MD  atorvastatin (LIPITOR) 20 MG tablet TAKE 1 TABLET (20 MG TOTAL) BY MOUTH AT BEDTIME. Patient not taking: Reported on 04/23/2021 06/06/20 06/06/21  Mellody Dance, DO  buPROPion (WELLBUTRIN SR) 150 MG 12 hr tablet TAKE 1 TABLET (150 MG TOTAL) BY MOUTH 2 (TWO) TIMES DAILY. Patient not taking: Reported on 04/23/2021 06/06/20 06/06/21  Mellody Dance, DO  ferrous sulfate 325 (65 FE) MG EC tablet Take 1 tablet (325 mg total) by mouth 2 (two) times daily with a meal. Patient not taking: Reported on 04/23/2021 06/06/20   Mellody Dance, DO  ferrous sulfate 325 (65 FE) MG tablet TAKE 1 TABLET (325 MG TOTAL) BY MOUTH 2 (TWO) TIMES DAILY WITH A MEAL. Patient not taking: Reported on 04/23/2021 06/06/20 06/06/21  Mellody Dance, DO  fexofenadine (ALLEGRA) 180 MG tablet Take 180 mg by mouth daily. Patient not taking: Reported on 04/23/2021    [provider]  ibuprofen (ADVIL) 200 MG tablet Take 200 mg by mouth every 6 (six) hours as needed. Patient not taking: Reported on 04/23/2021    [provider]  metFORMIN (GLUCOPHAGE) 1000 MG tablet TAKE 1/2 TABLET BY MOUTH TWICE A DAY FOR 1 WEEK THEN INCREASE TO 1 TABLET TWICE A DAY THEREAFTER Patient not taking: Reported on 04/23/2021 06/06/20 06/06/21  Mellody Dance, DO  metroNIDAZOLE (FLAGYL) 500 MG tablet Take 1 tablet (500 mg total) by mouth 2 (two) times daily. 04/29/21   Rasch, Artist Pais, NP  Polyethylene Glycol 400 (VISINE DRY EYE RELIEF OP) Apply to eye. Patient not taking: Reported on 04/23/2021    [provider]      Allergies    Latex    Review of Systems   Review of Systems  Respiratory:  Positive for shortness of breath.   Cardiovascular:  Positive for chest pain.    Physical Exam Updated Vital Signs BP (!) 156/100   Pulse 77   Temp 97.8 F (36.6 C) (Oral)   Resp 17   SpO2 98%  Physical Exam Vitals and nursing note reviewed.  Constitutional:      General: She is not in acute distress.    Appearance: She is well-developed. She is not diaphoretic.  HENT:     Head: Normocephalic and atraumatic.  Eyes:     Pupils: Pupils are equal, round, and reactive to light.  Cardiovascular:     Rate and Rhythm: Normal rate and regular rhythm.     Heart sounds: No murmur heard.  No friction rub. No gallop.  Pulmonary:     Effort: Pulmonary effort is normal.     Breath sounds: No wheezing or rales.     Comments: Bronchospastic cough with very faint end expiratory wheezes. Abdominal:     General: There is no distension.     Palpations: Abdomen is soft.     Tenderness: There is no abdominal tenderness.  Musculoskeletal:        General: No tenderness.     Cervical back: Normal range of motion and neck supple.  Skin:    General: Skin is warm and dry.  Neurological:     Mental Status: She is alert and oriented to person, place, and time.  Psychiatric:        Behavior: Behavior normal.     ED Results / Procedures / Treatments   Labs (all labs ordered are listed, but only abnormal results are  displayed) Labs Reviewed  RESP PANEL BY RT-PCR (RSV, FLU A&B, COVID)  [id number]    EKG EKG Interpretation  Date/Time:  Saturday October 17 2022 18:36:24 EST Ventricular Rate:  100 PR Interval:  141 QRS Duration: 86 QT Interval:  359 QTC Calculation: 463 R Axis:   56 Text Interpretation: Sinus tachycardia No significant change since last tracing Confirmed by Deno Etienne [phone]) on 10/17/2022 8:24:12 PM  Radiology DG Chest 2 View  Result Date: 10/17/2022 CLINICAL DATA:  HOB EXAM: CHEST - 2 VIEW COMPARISON:  02/26/2014 FINDINGS: Cardiac silhouette is unremarkable. No pneumothorax or pleural effusion. The lungs are clear. The visualized skeletal structures are unremarkable. IMPRESSION: No acute cardiopulmonary process. Electronically Signed   By: Sammie Bench M.D.   On: 10/17/2022 18:48    Procedures Procedures   Discussed smoking cessation with patient and was they were offerred resources to help stop.  Total time was 5 min CPT code 99406.     Medications Ordered in ED Medications  dexamethasone (DECADRON) tablet 10 mg (10 mg Oral Given 10/17/22 2048)    ED Course/ Medical Decision Making/ A&P                             Medical Decision Making Amount and/or Complexity of Data Reviewed Radiology: ordered.  Risk Prescription drug management.   47 yo F with a chief complaints of cough and congestion.  Going on for 3 months now.  Seems to be on and off.  She does smoke, had some improvement with beta agonist therapy here.  Give a dose of Decadron.  Albuterol inhaler for home.  Chest x-ray independently interpreted by me without focal obstructive pneumothorax.  Will discharge home.  PCP follow-up.  The patients results and plan were reviewed and discussed.   Any x-rays performed were independently reviewed by myself.   Differential diagnosis were considered with the presenting HPI.  Medications  dexamethasone (DECADRON) tablet 10 mg (10 mg Oral Given 10/17/22 2048)     Vitals:   10/17/22 1833 10/17/22 2030 10/17/22 2050 10/17/22 2100  BP: (!) 156/100 (!) 151/92  (!) 156/100  Pulse: 86 75  77  Resp: 20 20  17  $ Temp: 97.8 F (36.6 C)     TempSrc: Oral     SpO2: 98% 98% 98% 98%    Final diagnoses:  Wheezing-associated respiratory infection (WARI)    Admission/ observation were discussed with the admitting physician, patient and/or family and they are comfortable with the plan.  Final Clinical Impression(s) / ED Diagnoses Final diagnoses:  Wheezing-associated respiratory infection (WARI)    Rx / DC Orders ED Discharge Orders     None         Deno Etienne, DO 10/18/22 1459

## 2022-11-17 NOTE — Progress Notes (Signed)
Synopsis: Referred for possible asthma by Copland, Gay Filler, MD  Subjective:   PATIENT ID: Teresa Perez GENDER: female DOB: [birth year], MRN: [id number]  No chief complaint on file.  47F with severe obesity who is referred for possible asthma    Otherwise pertinent review of systems is negative.  Past Medical History:  Diagnosis Date   Acute bacterial bronchitis 11/05/2015   Anemia    Anxiety    Asthma    childhood asthma   Back pain    Boil    tendency to form boils   Depression    has been treated in past-no present meds   Family history of anesthesia complication [id number]   son- age 40 - "had lockjaw" after surgery (T&A)   Gestational diabetes mellitus (GDM), antepartum 08/17/2016   Fasting on 2 hour was 107   Headache(784.0)    Hyperglycemia    Hypertriglyceridemia    Menorrhagia 10/29/2015   Mixed diabetic hyperlipidemia associated with type 2 diabetes mellitus ([place]) 05/23/2020   PCOS (polycystic ovarian syndrome)    Pneumonia    AS A CHILD   Prediabetes      Family History  Problem Relation Age of Onset   Anesthesia problems Son        son-had T&A-2008-had trismus   Diabetes Mother    Hypertension Mother    Hyperlipidemia Mother    Arthritis Mother    Anxiety disorder Mother    Depression Mother    Hypertension Father    Anxiety disorder Father    Hypertension Paternal Grandmother    Hyperlipidemia Paternal Grandmother      Past Surgical History:  Procedure Laterality Date   CERVICAL CONIZATION W/BX N/A 10/31/2015   Procedure: CONIZATION CERVIX WITH BIOPSY;  Surgeon: Eldred Manges, MD;  Location: [facility];  Service: Gynecology;  Laterality: N/A;   CESAREAN SECTION     x2 previous   CESAREAN SECTION  07/03/2011   Procedure: CESAREAN SECTION;  Surgeon: Alwyn Pea, MD;  Location: [facility];  Service: Gynecology;  Laterality: N/A;   CESAREAN SECTION N/A 06/22/2013   Procedure: CESAREAN SECTION, repeat;  Surgeon: Eldred Manges, MD;  Location: [facility];   Service: Obstetrics;  Laterality: N/A;   CESAREAN SECTION WITH BILATERAL TUBAL LIGATION Bilateral 02/14/2017   Procedure: CESAREAN SECTION WITH BILATERAL TUBAL LIGATION;  Surgeon: Osborne Oman, MD;  Location: [facility];  Service: Obstetrics;  Laterality: Bilateral;   IUD REMOVAL N/A 10/31/2015   Procedure: INTRAUTERINE DEVICE (IUD) REMOVAL;  Surgeon: Eldred Manges, MD;  Location: [facility];  Service: Gynecology;  Laterality: N/A;   MOUTH SURGERY  2004    Social History   Socioeconomic History   Marital status: Single    Spouse name: Not on file   Number of children: Not on file   Years of education: Not on file   Highest education level: Not on file  Occupational History   Occupation: Customer Care Rep  Tobacco Use   Smoking status: Some Days    Packs/day: 0.25    Years: 16.00    Additional pack years: 0.00    Total pack years: 4.00    Types: Cigarettes   Smokeless tobacco: Never  Substance and Sexual Activity   Alcohol use: No    Comment: occasional    Drug use: No   Sexual activity: Yes    Birth control/protection: None  Other Topics Concern   Not on file  Social History Narrative   Not on file  Social Determinants of Health   Financial Resource Strain: Not on file  Food Insecurity: Not on file  Transportation Needs: Not on file  Physical Activity: Not on file  Stress: Not on file  Social Connections: Not on file  Intimate Partner Violence: Not on file     Allergies  Allergen Reactions   Latex Rash and Itching     Outpatient Medications Prior to Visit  Medication Sig Dispense Refill   ALPRAZolam (XANAX) 0.5 MG tablet TAKE 1/2 TABLETS BY MOUTH 2 TIMES A DAY AS NEEDED FOR ANXIETY (Patient not taking: Reported on 04/23/2021) 30 tablet 1   atorvastatin (LIPITOR) 20 MG tablet TAKE 1 TABLET (20 MG TOTAL) BY MOUTH AT BEDTIME. (Patient not taking: Reported on 04/23/2021) 30 tablet 0   buPROPion (WELLBUTRIN SR) 150 MG 12 hr tablet TAKE 1 TABLET (150 MG  TOTAL) BY MOUTH 2 (TWO) TIMES DAILY. (Patient not taking: Reported on 04/23/2021) 60 tablet 0   ferrous sulfate 325 (65 FE) MG EC tablet Take 1 tablet (325 mg total) by mouth 2 (two) times daily with a meal. (Patient not taking: Reported on 04/23/2021) 60 tablet 0   ferrous sulfate 325 (65 FE) MG tablet TAKE 1 TABLET (325 MG TOTAL) BY MOUTH 2 (TWO) TIMES DAILY WITH A MEAL. (Patient not taking: Reported on 04/23/2021) 100 tablet 0   fexofenadine (ALLEGRA) 180 MG tablet Take 180 mg by mouth daily. (Patient not taking: Reported on 04/23/2021)     ibuprofen (ADVIL) 200 MG tablet Take 200 mg by mouth every 6 (six) hours as needed. (Patient not taking: Reported on 04/23/2021)     metFORMIN (GLUCOPHAGE) 1000 MG tablet TAKE 1/2 TABLET BY MOUTH TWICE A DAY FOR 1 WEEK THEN INCREASE TO 1 TABLET TWICE A DAY THEREAFTER (Patient not taking: Reported on 04/23/2021) 60 tablet 0   metroNIDAZOLE (FLAGYL) 500 MG tablet Take 1 tablet (500 mg total) by mouth 2 (two) times daily. 14 tablet 0   Polyethylene Glycol 400 (VISINE DRY EYE RELIEF OP) Apply to eye. (Patient not taking: Reported on 04/23/2021)     No facility-administered medications prior to visit.       Objective:   Physical Exam:  General appearance: 47 y.o., female, NAD, conversant  Eyes: anicteric sclerae; PERRL, tracking appropriately HENT: NCAT; MMM Neck: Trachea midline; no lymphadenopathy, no JVD Lungs: CTAB, no crackles, no wheeze, with normal respiratory effort CV: RRR, no murmur  Abdomen: Soft, non-tender; non-distended, BS present  Extremities: No peripheral edema, warm Skin: Normal turgor and texture; no rash Psych: Appropriate affect Neuro: Alert and oriented to person and place, no focal deficit     There were no vitals filed for this visit.   on *** LPM *** RA BMI Readings from Last 3 Encounters:  04/23/21 41.66 kg/m  06/06/20 44.06 kg/m  05/23/20 44.22 kg/m   Wt Readings from Last 3 Encounters:  04/23/21 266 lb (120.7 kg)   06/06/20 273 lb (123.8 kg)  05/23/20 274 lb (124.3 kg)     CBC    Component Value Date/Time   WBC 13.1 (H) 04/23/2021 0903   WBC 10.2 02/01/2020 1145   RBC 4.81 04/23/2021 0903   RBC 4.94 02/01/2020 1145   HGB 10.5 (L) 04/23/2021 0903   HCT 34.8 04/23/2021 0903   PLT 449 04/23/2021 0903   MCV 72 (L) 04/23/2021 0903   MCH 21.8 (L) 04/23/2021 0903   MCH 24.6 (L) 02/15/2017 2202   MCHC 30.2 (L) 04/23/2021 0903   MCHC 30.9 02/01/2020  1145   RDW 15.4 04/23/2021 0903   LYMPHSABS 2.6 05/23/2020 1005   MONOABS 798 08/05/2016 1706   EOSABS 0.2 05/23/2020 1005   BASOSABS 0.0 05/23/2020 1005    Eos 200  Chest Imaging: CXR 10/17/22 unremarkable  Pulmonary Functions Testing Results:     No data to display             Assessment & Plan:    Plan:      Maryjane Hurter, MD [facility] 11/17/2022 9:36 AM

## 2022-11-18 ENCOUNTER — Encounter: Payer: Self-pay | Admitting: Student

## 2022-11-18 ENCOUNTER — Ambulatory Visit (INDEPENDENT_AMBULATORY_CARE_PROVIDER_SITE_OTHER): Payer: BC Managed Care – PPO | Admitting: Student

## 2022-11-18 VITALS — BP 132/84 | HR 77 | Temp 98.0°F | Ht 67.0 in | Wt 271.4 lb

## 2022-11-18 DIAGNOSIS — R053 Chronic cough: Secondary | ICD-10-CM

## 2022-11-18 DIAGNOSIS — Z1330 Encounter for screening examination for mental health and behavioral disorders, unspecified: Secondary | ICD-10-CM | POA: Diagnosis not present

## 2022-11-18 DIAGNOSIS — E1159 Type 2 diabetes mellitus with other circulatory complications: Secondary | ICD-10-CM | POA: Diagnosis not present

## 2022-11-18 DIAGNOSIS — E785 Hyperlipidemia, unspecified: Secondary | ICD-10-CM | POA: Diagnosis not present

## 2022-11-18 DIAGNOSIS — E1169 Type 2 diabetes mellitus with other specified complication: Secondary | ICD-10-CM | POA: Diagnosis not present

## 2022-11-18 MED ORDER — VARENICLINE TARTRATE 0.5 MG PO TABS: ORAL_TABLET | ORAL | 0 refills | Status: DC

## 2022-11-18 MED ORDER — BENZONATATE 100 MG PO CAPS: 200.0000 mg | ORAL_CAPSULE | Freq: Three times a day (TID) | ORAL | 1 refills | Status: DC | PRN

## 2022-11-18 MED ORDER — AMLODIPINE BESYLATE 5 MG PO TABS: 5.0000 mg | ORAL_TABLET | Freq: Every day | ORAL | 11 refills | Status: DC

## 2022-11-18 MED ORDER — VARENICLINE TARTRATE 1 MG PO TABS: 1.0000 mg | ORAL_TABLET | Freq: Every day | ORAL | 1 refills | Status: DC

## 2022-11-18 NOTE — Patient Instructions (Addendum)
-   STOP lisinopril - START amlodipine 5 mg daily - start chantix - see dosing instructions, avoid evening dose to avoid nightmares. Try to stop smoking after first week.  - albuterol 1-2 puffs as needed - tessalon perles as needed for cough - PFTs in 8 weeks - you may cancel if you're 100% better

## 2022-12-02 ENCOUNTER — Ambulatory Visit: Payer: BC Managed Care – PPO | Admitting: Internal Medicine

## 2022-12-02 NOTE — Progress Notes (Deleted)
CC: new patient  HPI:  [name] is a 47 y.o. female living with a history stated below and presents today to establish care with the [facility]. Please see problem based assessment and plan for additional details.  PMHx: Hypertension, diabetes, PCOS, vitamin D deficiency, GAD, depression  Meds: Meropenem 5 mg, lisinopril 10 mg, Lipitor 20 mg, citalopram 20 mg, Chantix, ozempic 1mg   Surg Hx:  Fam Hx:  Social Hx:  HCM: scope? Ophtho? Foot?   Past Medical History:  Diagnosis Date   Acute bacterial bronchitis 11/05/2015   Anemia    Anxiety    Asthma    childhood asthma   Back pain    Boil    tendency to form boils   Depression    has been treated in past-no present meds   Family history of anesthesia complication [id number]   son- age 43 - "had lockjaw" after surgery (T&A)   Gestational diabetes mellitus (GDM), antepartum 08/17/2016   Fasting on 2 hour was 107   Headache(784.0)    Hyperglycemia    Hypertriglyceridemia    Menorrhagia 10/29/2015   Mixed diabetic hyperlipidemia associated with type 2 diabetes mellitus ([place]) 05/23/2020   PCOS (polycystic ovarian syndrome)    Pneumonia    AS A CHILD   Prediabetes     Current Outpatient Medications on File Prior to Visit  Medication Sig Dispense Refill   amLODipine (NORVASC) 5 MG tablet Take 1 tablet (5 mg total) by mouth daily. 30 tablet 11   atorvastatin (LIPITOR) 20 MG tablet TAKE 1 TABLET (20 MG TOTAL) BY MOUTH AT BEDTIME. 30 tablet 0   benzonatate (TESSALON) 100 MG capsule Take 2 capsules (200 mg total) by mouth 3 (three) times daily as needed for cough. 60 capsule 1   citalopram (CELEXA) 20 MG tablet Take 20 mg by mouth daily.     lisinopril (ZESTRIL) 10 MG tablet Take 10 mg by mouth daily.     PROAIR HFA 108 (90 Base) MCG/ACT inhaler Inhale 2 puffs into the lungs every 4 (four) hours as needed.     [START ON 12/15/2022] varenicline (CHANTIX CONTINUING MONTH PAK) 1 MG tablet Take 1 tablet (1 mg total) by mouth daily. 30 tablet  1   varenicline (CHANTIX) 0.5 MG tablet Take 0.5 mg once daily for 3 days, then increase to 1 mg daily (2 tablets) 60 tablet 0   No current facility-administered medications on file prior to visit.    Family History  Problem Relation Age of Onset   Diabetes Mother    Hypertension Mother    Hyperlipidemia Mother    Arthritis Mother    Anxiety disorder Mother    Depression Mother    Lung cancer Mother        smoked   Hypertension Father    Anxiety disorder Father    Hypertension Paternal Grandmother    Hyperlipidemia Paternal Grandmother    Anesthesia problems Son        son-had T&A-2008-had trismus    Social History   Socioeconomic History   Marital status: Single    Spouse name: Not on file   Number of children: Not on file   Years of education: Not on file   Highest education level: Not on file  Occupational History   Occupation: Customer Care Rep  Tobacco Use   Smoking status: Every Day    Packs/day: 1.00    Years: 28.00    Additional pack years: 0.00    Total pack years: 28.00  Types: Cigarettes    Passive exposure: Past   Smokeless tobacco: Never  Vaping Use   Vaping Use: Never used  Substance and Sexual Activity   Alcohol use: No    Comment: occasional    Drug use: No   Sexual activity: Yes    Birth control/protection: None  Other Topics Concern   Not on file  Social History Narrative   Not on file   Social Determinants of Health   Financial Resource Strain: Not on file  Food Insecurity: Not on file  Transportation Needs: Not on file  Physical Activity: Not on file  Stress: Not on file  Social Connections: Not on file  Intimate Partner Violence: Not on file    Review of Systems: ROS negative except for what is noted on the assessment and plan.  There were no vitals filed for this visit.  Physical Exam: Constitutional: well-appearing *** sitting in ***, in no acute distress HENT: normocephalic atraumatic, mucous membranes moist Eyes:  conjunctiva non-erythematous Cardiovascular: regular rate and rhythm, no m/r/g Pulmonary/Chest: normal work of breathing on room air, lungs clear to auscultation bilaterally Abdominal: soft, non-tender, non-distended MSK: normal bulk and tone Neurological: alert & oriented x 3, no focal deficit Skin: warm and dry Psych: normal mood and behavior  Assessment & Plan:   DM: a1c7.4%  Patient {GC/GE:[id number]::"discussed with","seen with"} Dr. [id number]. Hoffman","Mullen","Narendra","Vincent","Guilloud","Lau","Machen"}  No problem-specific Assessment & Plan notes found for this encounter.   Buddy Duty, D.O. [facility], PGY-2 Phone: [phone] Date 12/02/2022 Time 8:23 AM

## 2022-12-10 ENCOUNTER — Other Ambulatory Visit: Payer: Self-pay | Admitting: Student

## 2023-02-09 ENCOUNTER — Encounter: Payer: Self-pay | Admitting: Internal Medicine

## 2023-02-09 ENCOUNTER — Ambulatory Visit (INDEPENDENT_AMBULATORY_CARE_PROVIDER_SITE_OTHER): Payer: BC Managed Care – PPO | Admitting: Internal Medicine

## 2023-02-09 ENCOUNTER — Other Ambulatory Visit: Payer: Self-pay

## 2023-02-09 ENCOUNTER — Other Ambulatory Visit: Payer: Self-pay | Admitting: Internal Medicine

## 2023-02-09 VITALS — BP 145/86 | HR 72 | Temp 98.6°F | Resp 32 | Ht 66.0 in | Wt 268.0 lb

## 2023-02-09 DIAGNOSIS — L409 Psoriasis, unspecified: Secondary | ICD-10-CM | POA: Diagnosis not present

## 2023-02-09 DIAGNOSIS — E785 Hyperlipidemia, unspecified: Secondary | ICD-10-CM | POA: Diagnosis not present

## 2023-02-09 DIAGNOSIS — E1159 Type 2 diabetes mellitus with other circulatory complications: Secondary | ICD-10-CM

## 2023-02-09 DIAGNOSIS — I10 Essential (primary) hypertension: Secondary | ICD-10-CM

## 2023-02-09 DIAGNOSIS — Z7985 Long-term (current) use of injectable non-insulin antidiabetic drugs: Secondary | ICD-10-CM

## 2023-02-09 DIAGNOSIS — F32A Depression, unspecified: Secondary | ICD-10-CM

## 2023-02-09 DIAGNOSIS — I152 Hypertension secondary to endocrine disorders: Secondary | ICD-10-CM | POA: Diagnosis not present

## 2023-02-09 DIAGNOSIS — F411 Generalized anxiety disorder: Secondary | ICD-10-CM

## 2023-02-09 DIAGNOSIS — N920 Excessive and frequent menstruation with regular cycle: Secondary | ICD-10-CM

## 2023-02-09 DIAGNOSIS — E1169 Type 2 diabetes mellitus with other specified complication: Secondary | ICD-10-CM | POA: Diagnosis not present

## 2023-02-09 DIAGNOSIS — D649 Anemia, unspecified: Secondary | ICD-10-CM

## 2023-02-09 DIAGNOSIS — D509 Iron deficiency anemia, unspecified: Secondary | ICD-10-CM

## 2023-02-09 DIAGNOSIS — Z720 Tobacco use: Secondary | ICD-10-CM | POA: Insufficient documentation

## 2023-02-09 DIAGNOSIS — F17210 Nicotine dependence, cigarettes, uncomplicated: Secondary | ICD-10-CM

## 2023-02-09 DIAGNOSIS — D508 Other iron deficiency anemias: Secondary | ICD-10-CM

## 2023-02-09 MED ORDER — CITALOPRAM HYDROBROMIDE 40 MG PO TABS: 40.0000 mg | ORAL_TABLET | Freq: Every day | ORAL | 2 refills | Status: DC

## 2023-02-09 MED ORDER — NORETHIN ACE-ETH ESTRAD-FE 1-20 MG-MCG PO TABS: 1.0000 | ORAL_TABLET | Freq: Every day | ORAL | 6 refills | Status: DC

## 2023-02-09 MED ORDER — TRIAMCINOLONE ACETONIDE 0.5 % EX CREA: 1.0000 | TOPICAL_CREAM | Freq: Two times a day (BID) | CUTANEOUS | 0 refills | Status: AC

## 2023-02-09 MED ORDER — POLYSACCHARIDE IRON COMPLEX 150 MG PO CAPS: 150.0000 mg | ORAL_CAPSULE | Freq: Every day | ORAL | 3 refills | Status: DC

## 2023-02-09 MED ORDER — VALSARTAN 80 MG PO TABS: 80.0000 mg | ORAL_TABLET | Freq: Every day | ORAL | 3 refills | Status: DC

## 2023-02-09 MED ORDER — NORETHIN ACE-ETH ESTRAD-FE 1-20 MG-MCG PO TABS: 1.0000 | ORAL_TABLET | ORAL | 6 refills | Status: DC

## 2023-02-09 MED ORDER — POLYSACCHARIDE IRON COMPLEX 150 MG PO CAPS: 150.0000 mg | ORAL_CAPSULE | ORAL | 3 refills | Status: DC

## 2023-02-09 NOTE — Assessment & Plan Note (Signed)
Patient has a history of GAD currently being treated with citalopram 20 mg daily. She was tearful on exam today, and reports she has been under a lot of stress due to family issues. PHQ-9 score of 17 and GAD-7 score of 14 today putting her is a category of moderate anxiety and depression. She has tried therapy in the past which she felt was helpful at time. Will refer her to counselor today. Additionally, will increase her citalopram from 20 to 40 mg.   > Increase citalopram from 20 mg to 40 mg > Ambulatory behavioral health referral

## 2023-02-09 NOTE — Patient Instructions (Addendum)
Thank you, Ms.Allannah Aristizabal for allowing [place] to provide your care today.   Diabetes Please continue to follow with Dr. Shawnee Knapp.  Iron deficiency anemia I am checking some blood work for this. Please start taking lo estrin which is a birth control pill to help regulate cycles. I have also sent in an iron supplement which you can take every other day.   Blood pressure Your blood pressure was high today. Continue taking amlodipine 5 mg. Start taking valsartan 80 mg. Follow-up in 4 weeks to repeat blood pressure and blood work.  Psoriasis I have sent in a steroid cream. Please use this 2 x daily for 14 days. Following that try using thick lotions such as eucerin.    I have ordered the following labs for you:  Lab Orders  No laboratory test(s) ordered today      Referrals ordered today:   Referral Orders  No referral(s) requested today     I have ordered the following medication/changed the following medications:   Stop the following medications: Medications Discontinued During This Encounter  Medication Reason   lisinopril (ZESTRIL) 10 MG tablet Discontinued by provider     Start the following medications: No orders of the defined types were placed in this encounter.    Follow up: 1 month   We look forward to seeing you next time. Please call our clinic at [phone] if you have any questions or concerns. The best time to call is Monday-Friday from 9am-4pm, but there is someone available 24/7. If after hours or the weekend, call the main hospital number and ask for the Internal Medicine Resident On-Call. If you need medication refills, please notify your pharmacy one week in advance and they will send [place] a request.   Thank you for trusting me with your care. Wishing you the best!   Rudene Christians, DO [facility]

## 2023-02-09 NOTE — Assessment & Plan Note (Signed)
Patient has a history of psoriasis with intermittent flares. Today she complains of a pruritic rash on the left calf. She was previously prescribed clobetasol propionate without relief. Exam reveals 11x5 cm raised plaque with silver scales. Will trial high potency triamcinolone cream and emollient.   > Kenalog 0.5% > Eucerin cream

## 2023-02-09 NOTE — Assessment & Plan Note (Signed)
Patient's blood pressure was elevated at 154/86 today. She is currently on amlodipine 5 mg. She was previously on lisinopril but did not tolerate this due to chronic cough. Given she is not in a normotensive range will start her on valsartan today.  > continue amlodipine  > begin valsartan 80 mg

## 2023-02-09 NOTE — Assessment & Plan Note (Signed)
Patient has smokes about 1 pack per day for the last 27 years. She is currently prescribed Chantix, however states she does not take it as her cravings are too high. Will continue to encourage smoking cessation.  > Chantix 1 mg daily

## 2023-02-09 NOTE — Progress Notes (Deleted)
DM Follows with endocrinology, Dr. Shawnee Knapp with [facility]. Last A1c 3/24 was 7.4%. Medications include IM semaglutide 1 mg weekly P: -BMP for GFR, last check 8/22 GFR wnl -microalbumin -HgbA1c  Microcytic anemia Hgb 10.5 with MCV 72 in 8/22.  -CBC -iron studies -OCPs -iron tabs  HTN Lisinopril 10 mg  Lisinopril stopped by pulm and switched to amlodipine  -amlodipine 5 mg -add valsartn 80  HLD Airvastatin 10 mg  Lab Results  Component Value Date   CHOL 193 05/23/2020   HDL 43 05/23/2020   LDLCALC 129 (H) 05/23/2020   TRIG 116 05/23/2020   CHOLHDL 4 02/01/2020     Psoriasis  Kenolog Emolliant  Tobacco

## 2023-02-09 NOTE — Assessment & Plan Note (Signed)
Patient has a history of type II DM. She is currently being managed by endocrinology. Her last A1c was 7.4 two months ago. Her current medications include ozempic 1 mg. She was previously on metformin but did not tolerate this due to GI side effects. She would like to continue follow up with endocrinology, so will not make any changes today.   > continue Ozempic 1 mg

## 2023-02-09 NOTE — Assessment & Plan Note (Addendum)
Patient has a history of microcytic anemia. CBC last year revealed hemoglobin of 10.5 which is around her baseline. She has a history of heavy menstrual periods lasting about 7 days with associated fatigue. Suspect iron deficiency as a result of this. Will obtain a CBC and iron studies today. Additionally, will begin iron supplementation and OCPs to treat her menorrhagia.   > CBC > Iron study > iron polysaccharide 120 mg daily  > Loestrin-FE

## 2023-02-09 NOTE — Progress Notes (Unsigned)
Subjective:   Patient ID: Teresa Perez female   DOB: 1976-07-18 47 y.o.   MRN: [id number]  HPI: [name] is a 47 y.o. female who presents today to establish care.   Patient's past medical history and current med list, listed below. She has smoked about 1 pack a day for the past 27 years. She denies any illicit drug use. She drink alcohol about 3 times a week. She is currently living in [place] with her 4 children. She works for [employer].   Please see problem based assessment and plan for further work up and management.   Patient Active Problem List   Diagnosis Date Noted   Psoriasis 02/09/2023   Tobacco use 02/09/2023   Anxious mood as adjustment reaction 06/06/2020   Hypertension associated with diabetes (HCC) 05/23/2020   Mixed diabetic hyperlipidemia associated with type 2 diabetes mellitus (HCC) 05/23/2020   GAD (generalized anxiety disorder) 05/23/2020   Adjustment reaction with anxiety and depression 07/29/2017   Borderline blood pressure 07/29/2017   Diabetes mellitus (HCC) 07/29/2017   Gestational diabetes mellitus (GDM), antepartum 08/17/2016   History of conization of cervix affecting pregnancy, antepartum 08/05/2016   Vitamin D deficiency 01/09/2014   Depression 01/02/2014   PCOS (polycystic ovarian syndrome) 01/02/2014   Anemia 06/24/2013   Severe obesity (BMI >= 40) (HCC) 06/22/2013     Current Outpatient Medications  Medication Sig Dispense Refill   citalopram (CELEXA) 40 MG tablet Take 1 tablet (40 mg total) by mouth daily. 30 tablet 2   iron polysaccharides (NU-IRON) 150 MG capsule Take 1 capsule (150 mg total) by mouth daily. 30 capsule 3   norethindrone-ethinyl estradiol-FE (LOESTRIN FE 1/20) 1-20 MG-MCG tablet Take 1 tablet by mouth every other day. 28 tablet 6   triamcinolone cream (KENALOG) 0.5 % Apply 1 Application topically 2 (two) times daily for 15 days. 30 g 0   valsartan (DIOVAN) 80 MG tablet Take 1 tablet (80 mg total) by  mouth daily. 30 tablet 3   amLODipine (NORVASC) 5 MG tablet Take 1 tablet (5 mg total) by mouth daily. 30 tablet 11   atorvastatin (LIPITOR) 20 MG tablet TAKE 1 TABLET (20 MG TOTAL) BY MOUTH AT BEDTIME. 30 tablet 0   benzonatate (TESSALON) 100 MG capsule Take 2 capsules (200 mg total) by mouth 3 (three) times daily as needed for cough. 60 capsule 1   PROAIR HFA 108 (90 Base) MCG/ACT inhaler Inhale 2 puffs into the lungs every 4 (four) hours as needed.     varenicline (CHANTIX) 1 MG tablet TAKE 1 TABLET BY MOUTH EVERY DAY 90 tablet 1   No current facility-administered medications for this visit.     Review of Systems: Pertinent ROS listed in the A&P. Otherwise negative.   Objective:   Physical Exam: Vitals:   02/09/23 0902 02/09/23 0906  BP: (!) 143/83 (!) 145/86  Pulse: 72 72  Resp: (!) 32   Temp: 98.6 F (37 C)   TempSrc: Oral   SpO2: 100%   Weight: 268 lb (121.6 kg)   Height: 5\' 6"  (1.676 m)    Physical Exam Constitutional:      Appearance: Normal appearance.  HENT:     Head: Normocephalic and atraumatic.  Cardiovascular:     Rate and Rhythm: Normal rate and regular rhythm.  Pulmonary:     Effort: Pulmonary effort is normal.     Breath sounds: Normal breath sounds.  Skin:    General: Skin is warm and  dry.     Comments: 11cm x 5cm erythematous plaque with silver scales on the left calf  Neurological:     General: No focal deficit present.     Mental Status: She is alert and oriented to person, place, and time.  Psychiatric:        Mood and Affect: Mood normal.        Behavior: Behavior normal.      Assessment & Plan:   Diabetes mellitus (HCC) Patient has a history of type II DM. She is currently being managed by endocrinology. Her last A1c was 7.4 two months ago. Her current medications include ozempic 1 mg. She was previously on metformin but did not tolerate this due to GI side effects. She would like to continue follow up with endocrinology, so will not make  any changes today.   > continue Ozempic 1 mg  Anemia Patient has a history of microcytic anemia. CBC last year revealed hemoglobin of 10.5 which is around her baseline. She has a history of heavy menstrual periods lasting about 7 days with associated fatigue. Suspect iron deficiency as a result of this. Will obtain a CBC and iron studies today. Additionally, will begin iron supplementation and OCPs to treat her menorrhagia.   > CBC > Iron study > iron polysaccharide 120 mg daily  > Loestrin-FE   Hypertension associated with diabetes (HCC) Patient's blood pressure was elevated at 154/86 today. She is currently on amlodipine 5 mg. She was previously on lisinopril but did not tolerate this due to chronic cough. Given she is not in a normotensive range will start her on valsartan today.  > continue amlodipine  > begin valsartan 80 mg   Psoriasis Patient has a history of psoriasis with intermittent flares. Today she complains of a pruritic rash on the left calf. She was previously prescribed clobetasol propionate without relief. Exam reveals 11x5 cm raised plaque with silver scales. Will trial high potency triamcinolone cream and emollient.   > Kenalog 0.5% > Eucerin cream  GAD (generalized anxiety disorder) Patient has a history of GAD currently being treated with citalopram 20 mg daily. She was tearful on exam today, and reports she has been under a lot of stress due to family issues. PHQ-9 score of 17 and GAD-7 score of 14 today putting her is a category of moderate anxiety and depression. She has tried therapy in the past which she felt was helpful at time. Will refer her to counselor today. Additionally, will increase her citalopram from 20 to 40 mg.   > Increase citalopram from 20 mg to 40 mg > Ambulatory behavioral health referral  Tobacco use Patient has smokes about 1 pack per day for the last 27 years. She is currently prescribed Chantix, however states she does not take it as her  cravings are too high. Will continue to encourage smoking cessation.  > Chantix 1 mg daily

## 2023-02-10 LAB — CBC
Hematocrit: 35.5 % (ref 34.0–46.6)
Hemoglobin: 10.4 g/dL — ABNORMAL LOW (ref 11.1–15.9)
MCH: 21.5 pg — ABNORMAL LOW (ref 26.6–33.0)
MCHC: 29.3 g/dL — ABNORMAL LOW (ref 31.5–35.7)
MCV: 73 fL — ABNORMAL LOW (ref 79–97)
Platelets: 434 10*3/uL (ref 150–450)
RBC: 4.84 x10E6/uL (ref 3.77–5.28)
RDW: 15.7 % — ABNORMAL HIGH (ref 11.7–15.4)
WBC: 9.6 10*3/uL (ref 3.4–10.8)

## 2023-02-10 LAB — LIPID PANEL
Chol/HDL Ratio: 3.4 ratio (ref 0.0–4.4)
Cholesterol, Total: 153 mg/dL (ref 100–199)
HDL: 45 mg/dL (ref >39)
LDL Chol Calc (NIH): 90 mg/dL (ref 0–99)
Triglycerides: 95 mg/dL (ref 0–149)
VLDL Cholesterol Cal: 18 mg/dL (ref 5–40)

## 2023-02-10 LAB — IRON,TIBC AND FERRITIN PANEL
Ferritin: 20 ng/mL (ref 15–150)
Iron Saturation: 9 % — CL (ref 15–55)
Iron: 33 ug/dL (ref 27–159)
Total Iron Binding Capacity: 379 ug/dL (ref 250–450)
UIBC: 346 ug/dL (ref 131–425)

## 2023-02-13 NOTE — Progress Notes (Signed)
Internal Medicine Clinic Attending  Case discussed with the resident at the time of the visit.  We reviewed the resident's history and exam and pertinent patient test results.  I agree with the assessment, diagnosis, and plan of care documented in the resident's note.  

## 2023-03-08 ENCOUNTER — Other Ambulatory Visit: Payer: Self-pay | Admitting: Internal Medicine

## 2023-03-08 DIAGNOSIS — F32A Depression, unspecified: Secondary | ICD-10-CM

## 2023-03-16 ENCOUNTER — Ambulatory Visit (INDEPENDENT_AMBULATORY_CARE_PROVIDER_SITE_OTHER): Payer: BC Managed Care – PPO | Admitting: Internal Medicine

## 2023-03-16 VITALS — BP 152/86 | HR 75 | Temp 98.2°F | Ht 66.0 in | Wt 278.5 lb

## 2023-03-16 DIAGNOSIS — R635 Abnormal weight gain: Secondary | ICD-10-CM | POA: Diagnosis not present

## 2023-03-16 DIAGNOSIS — R053 Chronic cough: Secondary | ICD-10-CM

## 2023-03-16 DIAGNOSIS — I10 Essential (primary) hypertension: Secondary | ICD-10-CM

## 2023-03-16 DIAGNOSIS — E785 Hyperlipidemia, unspecified: Secondary | ICD-10-CM

## 2023-03-16 DIAGNOSIS — E1169 Type 2 diabetes mellitus with other specified complication: Secondary | ICD-10-CM | POA: Diagnosis not present

## 2023-03-16 DIAGNOSIS — F411 Generalized anxiety disorder: Secondary | ICD-10-CM

## 2023-03-16 DIAGNOSIS — F17210 Nicotine dependence, cigarettes, uncomplicated: Secondary | ICD-10-CM

## 2023-03-16 LAB — GLUCOSE, CAPILLARY: Glucose-Capillary: 125 mg/dL — ABNORMAL HIGH (ref 70–99)

## 2023-03-16 LAB — POCT GLYCOSYLATED HEMOGLOBIN (HGB A1C): Hemoglobin A1C: 6.5 % — AB (ref 4.0–5.6)

## 2023-03-16 NOTE — Assessment & Plan Note (Signed)
Noted nonproductive cough for last 4 weeks. She saw pulmonology for this 3/24 and lisinopril was discontinued. Cough went away for several months. She denies sinus congestion or sore throat. History of asthma and she has had more nocturnal awakenings due to breathing over last month. She stopped smoking to see if this helped symptoms. She uses albuterol at night when she feels short of breath P: PFT Continue albuterol inhaler

## 2023-03-16 NOTE — Assessment & Plan Note (Signed)
She follows with endocrine for diabetes. She stopped ozempic about 2 weeks ago as she did not think it was helping with weight loss. She was not having side effects from medication. Her A1c is at 6.5, improved from a few months ago at 7.4. She also is meeting with registered dietician through endocrine and has made changes to diet and exercise. P: Restart ozempic 1mg  Follow-up with endocrine 9/24, she would like to continue seeing them

## 2023-03-16 NOTE — Assessment & Plan Note (Signed)
She noted 10 lb weight gain over last month. Since starting iron supplement she has had some constipation but this is improving. She noted weight gain occurred prior to stopping ozempic 2 weeks ago. Will check thyroid labs with weight gain and constipation. P: TSH Free T4 Restart ozempic 1 mg weekly

## 2023-03-16 NOTE — Patient Instructions (Addendum)
Thank you, Ms.Zaliah Ashmore for allowing [place] to provide your care today.  I am getting lung studies for your chronic cough. I agree that stopping smoking is the best thing to do for your lungs.  Blood pressure Please take both amlodipine and valsartan. Follow-up in 4 weeks and we will recheck blood pressure and labs.  Diabetes Your A1c looked great today at 6.5. I do recommend restarting ozempic.  Weight gain With your history of constipation and weight gain I am checking  labs with thyroid. I will call with results.  Anxiety Please follow-up with Bianca.  Right knee This seems like a sprain of your knee. Please use compression during the day with walking. You can try tylenol 1000mg  every 6 hours for pain relief with ibuprofen 800 mg every 8 hours for 2-3 days to see if this helps with inflammation. I would also ice your knee 1-2 times daily.  I am glad to hear that the constipation is better. If this worsens there are over the counter  I have ordered the following labs for you:  Lab Orders         Glucose, capillary         TSH         T4, Free         POC Hbg A1C       Referrals ordered today:   Referral Orders  No referral(s) requested today     I have ordered the following medication/changed the following medications:   Stop the following medications: There are no discontinued medications.   Start the following medications: No orders of the defined types were placed in this encounter.    Follow up:  4 weeks  We look forward to seeing you next time. Please call our clinic at [phone] if you have any questions or concerns. The best time to call is Monday-Friday from 9am-4pm, but there is someone available 24/7. If after hours or the weekend, call the main hospital number and ask for the Internal Medicine Resident On-Call. If you need medication refills, please notify your pharmacy one week in advance and they will send [place] a request.   Thank you for trusting me with  your care. Wishing you the best!   Rudene Christians, DO [facility]

## 2023-03-16 NOTE — Assessment & Plan Note (Signed)
    03/16/2023    4:27 PM 02/09/2023   10:30 AM  GAD 7 : Generalized Anxiety Score  Nervous, Anxious, on Edge 1 2  Control/stop worrying 3 3  Worry too much - different things 2 3  Trouble relaxing 2 3  Restless 2 2  Easily annoyed or irritable 1 2  Afraid - awful might happen 1 2  Total GAD 7 Score 12 17  Anxiety Difficulty Somewhat difficult Extremely difficult    Noted improvement in symptoms since increasing dose of citalopram. She has follow-up with Christen Butter next week.

## 2023-03-16 NOTE — Progress Notes (Signed)
Subjective:  CC: cough  HPI:  [name] is a 47 y.o. female with a past medical history stated below and presents today for follow-up after establishing care at [facility] last month.  She has been doing well since last viist. She did have a fall in the bathroom on Saturday from slipping on something that was on the floor. She is having pain to lateral knee. Her knee hyperexteended with fall. No pop during fall. She's been using NSAIDs and muscle rub. This has helped short term. Pain only with walking.   For her blood pressure she has been taking amlodipine, but did not start valsartan.  She has noted a nonproductive cough over the last 4 weeks that is worst at night.  Please see problem based assessment and plan for additional details.  Past Medical History:  Diagnosis Date   Acute bacterial bronchitis 11/05/2015   Anemia    Anxiety    Asthma    childhood asthma   Back pain    Boil    tendency to form boils   Depression    has been treated in past-no present meds   Family history of anesthesia complication 2008   son- age 15 - "had lockjaw" after surgery (T&A)   Gestational diabetes mellitus (GDM), antepartum 08/17/2016   Fasting on 2 hour was 107   Headache(784.0)    Hyperglycemia    Hypertriglyceridemia    Menorrhagia 10/29/2015   Mixed diabetic hyperlipidemia associated with type 2 diabetes mellitus (HCC) 05/23/2020   PCOS (polycystic ovarian syndrome)    Pneumonia    AS A CHILD   Prediabetes     Current Outpatient Medications on File Prior to Visit  Medication Sig Dispense Refill   amLODipine (NORVASC) 5 MG tablet Take 1 tablet (5 mg total) by mouth daily. 30 tablet 11   atorvastatin (LIPITOR) 20 MG tablet TAKE 1 TABLET (20 MG TOTAL) BY MOUTH AT BEDTIME. 30 tablet 0   benzonatate (TESSALON) 100 MG capsule Take 2 capsules (200 mg total) by mouth 3 (three) times daily as needed for cough. 60 capsule 1   citalopram (CELEXA) 40 MG tablet TAKE 1 TABLET BY MOUTH EVERY DAY 90  tablet 1   iron polysaccharides (NU-IRON) 150 MG capsule Take 1 capsule (150 mg total) by mouth every other day. 30 capsule 3   norethindrone-ethinyl estradiol-FE (LOESTRIN FE 1/20) 1-20 MG-MCG tablet Take 1 tablet by mouth daily. 28 tablet 6   PROAIR HFA 108 (90 Base) MCG/ACT inhaler Inhale 2 puffs into the lungs every 4 (four) hours as needed.     valsartan (DIOVAN) 80 MG tablet Take 1 tablet (80 mg total) by mouth daily. 30 tablet 3   varenicline (CHANTIX) 1 MG tablet TAKE 1 TABLET BY MOUTH EVERY DAY 90 tablet 1   No current facility-administered medications on file prior to visit.    Family History  Problem Relation Age of Onset   Diabetes Mother    Hypertension Mother    Hyperlipidemia Mother    Arthritis Mother    Anxiety disorder Mother    Depression Mother    Lung cancer Mother        smoked   Hypertension Father    Anxiety disorder Father    Hypertension Paternal Grandmother    Hyperlipidemia Paternal Grandmother    Anesthesia problems Son        son-had T&A-2008-had trismus    Social History   Socioeconomic History   Marital status: Single  Spouse name: Not on file   Number of children: Not on file   Years of education: Not on file   Highest education level: Not on file  Occupational History   Occupation: Customer Care Rep  Tobacco Use   Smoking status: Every Day    Current packs/day: 0.10    Average packs/day: 0.1 packs/day for 28.0 years (2.8 ttl pk-yrs)    Types: Cigarettes    Passive exposure: Past   Smokeless tobacco: Never   Tobacco comments:    1 pk per week  Vaping Use   Vaping status: Never Used  Substance and Sexual Activity   Alcohol use: No    Comment: occasional    Drug use: No   Sexual activity: Yes    Birth control/protection: None  Other Topics Concern   Not on file  Social History Narrative   Not on file   Social Determinants of Health   Financial Resource Strain: Not on file  Food Insecurity: No Food Insecurity (02/09/2023)    Hunger Vital Sign    Worried About Running Out of Food in the Last Year: Never true    Ran Out of Food in the Last Year: Never true  Transportation Needs: No Transportation Needs (02/09/2023)   PRAPARE - Administrator, Civil Service (Medical): No    Lack of Transportation (Non-Medical): No  Physical Activity: Not on file  Stress: Not on file  Social Connections: Socially Isolated (02/09/2023)   Social Connection and Isolation Panel [NHANES]    Frequency of Communication with Friends and Family: More than three times a week    Frequency of Social Gatherings with Friends and Family: More than three times a week    Attends Religious Services: Never    Database administrator or Organizations: No    Attends Banker Meetings: Never    Marital Status: Divorced  Catering manager Violence: Not At Risk (02/09/2023)   Humiliation, Afraid, Rape, and Kick questionnaire    Fear of Current or Ex-Partner: No    Emotionally Abused: No    Physically Abused: No    Sexually Abused: No    Review of Systems: ROS negative except for what is noted on the assessment and plan.  Objective:   Vitals:   03/16/23 1531 03/16/23 1609  BP: (!) 145/89 (!) 152/86  Pulse: 81 75  Temp: 98.2 F (36.8 C)   TempSrc: Oral   SpO2: 100%   Weight: 278 lb 8 oz (126.3 kg)   Height: 5\' 6"  (1.676 m)     Physical Exam: Constitutional: well-appearing  Cardiovascular: regular rate and rhythm, no m/r/g Pulmonary/Chest: normal work of breathing on room air, lungs clear to auscultation bilaterally Abdominal: soft, non-tender, non-distended MSK: mild effusion to right knee, no ecchymosis, no laxity with anterior/ posterior drawer testing and valgus/ varus stress testing Skin: warm and dry  Assessment & Plan:  Weight gain She noted 10 lb weight gain over last month. Since starting iron supplement she has had some constipation but this is improving. She noted weight gain occurred prior to  stopping ozempic 2 weeks ago. Will check thyroid labs with weight gain and constipation. P: TSH Free T4 Restart ozempic 1 mg weekly  Type 2 diabetes mellitus with hyperlipidemia (HCC) She follows with endocrine for diabetes. She stopped ozempic about 2 weeks ago as she did not think it was helping with weight loss. She was not having side effects from medication. Her A1c is at 6.5, improved  from a few months ago at 7.4. She also is meeting with registered dietician through endocrine and has made changes to diet and exercise. P: Restart ozempic 1mg  Follow-up with endocrine 9/24, she would like to continue seeing them  Chronic cough Noted nonproductive cough for last 4 weeks. She saw pulmonology for this 3/24 and lisinopril was discontinued. Cough went away for several months. She denies sinus congestion or sore throat. History of asthma and she has had more nocturnal awakenings due to breathing over last month. She stopped smoking to see if this helped symptoms. She uses albuterol at night when she feels short of breath P: PFT Continue albuterol inhaler  GAD (generalized anxiety disorder)    03/16/2023    4:27 PM 02/09/2023   10:30 AM  GAD 7 : Generalized Anxiety Score  Nervous, Anxious, on Edge 1 2  Control/stop worrying 3 3  Worry too much - different things 2 3  Trouble relaxing 2 3  Restless 2 2  Easily annoyed or irritable 1 2  Afraid - awful might happen 1 2  Total GAD 7 Score 12 17  Anxiety Difficulty Somewhat difficult Extremely difficult    Noted improvement in symptoms since increasing dose of citalopram. She has follow-up with Christen Butter next week.  Patient discussed with Dr. Dionne Bucy Shavelle Runkel, D.O. [facility]  PGY-3 Pager: [phone]  Phone: [phone] Date 03/16/2023  Time 6:01 PM

## 2023-03-17 ENCOUNTER — Encounter: Payer: Self-pay | Admitting: Internal Medicine

## 2023-03-17 LAB — T4, FREE: Free T4: 1.09 ng/dL (ref 0.82–1.77)

## 2023-03-17 LAB — TSH: TSH: 2.29 u[IU]/mL (ref 0.450–4.500)

## 2023-03-17 NOTE — Progress Notes (Signed)
 Internal Medicine Clinic Attending  Case discussed with the resident at the time of the visit.  We reviewed the resident's history and exam and pertinent patient test results.  I agree with the assessment, diagnosis, and plan of care documented in the resident's note.  

## 2023-03-22 ENCOUNTER — Ambulatory Visit (INDEPENDENT_AMBULATORY_CARE_PROVIDER_SITE_OTHER): Payer: BC Managed Care – PPO | Admitting: Licensed Clinical Social Worker

## 2023-03-22 DIAGNOSIS — F32A Depression, unspecified: Secondary | ICD-10-CM

## 2023-03-22 DIAGNOSIS — F419 Anxiety disorder, unspecified: Secondary | ICD-10-CM

## 2023-03-22 DIAGNOSIS — F4321 Adjustment disorder with depressed mood: Secondary | ICD-10-CM | POA: Diagnosis not present

## 2023-03-25 NOTE — BH Specialist Note (Signed)
Integrated Behavioral Health Initial In-Person Visit  MRN: [id number] Name: Teresa Perez  Number of Integrated Behavioral Health Clinician visits: 1- Initial Visit  Session Start time: 1530    Session End time: 1600  Total time in minutes: 30   Types of Service: Individual psychotherapy and General Behavioral Integrated Care (BHI)  Interpretor:No. Interpretor Name and Language: N/A   Warm Hand Off Completed.        Subjective: Teresa Perez is a 47 y.o. female  Patient was referred by PCP for Depression. Patient reports the following symptoms/concerns: Patient expressed over eating and not sleeping. Patient discussed her mother passing away from Cancer and close family being in prison. Patients goal is to be happy without being attached to a person. Patient stated she would like to discuss childhood trauma on next session. [facility]  referred patient to [facility] for grief support regarding the passing of her mother.   Duration of problem: More than 3 years; Severity of problem: moderate  Objective: Mood: Depressed and Affect: Appropriate Risk of harm to self or others: No plan to harm self or others  Life Context: Family and Social: Patient is a mother School/Work: N/A Self-Care: Patient continues to work on self care. Patient stated she is booking a massage soon. Life Changes: Death of mother  Patient and/or Family's Strengths/Protective Factors: Social and Emotional competence and Sense of purpose  Goals Addressed: Patient will: Reduce symptoms of: anxiety and depression Increase knowledge and/or ability of: coping skills  Demonstrate ability to: Increase healthy adjustment to current life circumstances  Progress towards Goals: Ongoing  Interventions: Interventions utilized: Motivational Interviewing, Solution-Focused Strategies, Mindfulness or Management consultant, and Link to [facility]  Standardized Assessments completed: PHQ-SADS     03/16/2023     4:27 PM 03/16/2023    4:26 PM 02/09/2023   10:30 AM  PHQ-SADS Last 3 Score only  Total GAD-7 Score 12  17  PHQ Adolescent Score  10 17     Patient and/or Family Response: Patient agrees to therapy.  Patient Centered Plan: Patient is on the following Treatment Plan(s):  Reduce anxiety and depression. Grief support.  Assessment: Patient currently experiencing Grief, anxiety and depression.   Patient may benefit from ongoing therapy.  Plan: Follow up with behavioral health clinician on : within the next 30 days.  Christen Butter, MSW, LCSW-A She/Her Behavioral Health Clinician [facility]  Internal Medicine Center Direct [phone]  Fax [phone] Main Office Phone: [phone] [address] [place] [place], [place] [address] Website: [facility] Internal Medicine [facility]  [place], [place]  [place]

## 2023-04-14 ENCOUNTER — Ambulatory Visit: Payer: BC Managed Care – PPO | Admitting: Licensed Clinical Social Worker

## 2023-04-14 ENCOUNTER — Other Ambulatory Visit: Payer: Self-pay

## 2023-04-14 ENCOUNTER — Emergency Department (HOSPITAL_COMMUNITY): Payer: BC Managed Care – PPO

## 2023-04-14 ENCOUNTER — Emergency Department (HOSPITAL_COMMUNITY)
Admission: EM | Admit: 2023-04-14 | Discharge: 2023-04-15 | Disposition: A | Discharge status: Home / Self Care | Payer: BC Managed Care – PPO | Attending: Emergency Medicine | Admitting: Emergency Medicine

## 2023-04-14 ENCOUNTER — Encounter (HOSPITAL_COMMUNITY): Payer: Self-pay

## 2023-04-14 DIAGNOSIS — W1839XA Other fall on same level, initial encounter: Secondary | ICD-10-CM | POA: Insufficient documentation

## 2023-04-14 DIAGNOSIS — Z91040 Latex allergy status: Secondary | ICD-10-CM | POA: Insufficient documentation

## 2023-04-14 DIAGNOSIS — F32A Depression, unspecified: Secondary | ICD-10-CM

## 2023-04-14 DIAGNOSIS — M25561 Pain in right knee: Secondary | ICD-10-CM | POA: Diagnosis not present

## 2023-04-14 DIAGNOSIS — S82001A Unspecified fracture of right patella, initial encounter for closed fracture: Secondary | ICD-10-CM | POA: Diagnosis not present

## 2023-04-14 DIAGNOSIS — G8911 Acute pain due to trauma: Secondary | ICD-10-CM | POA: Diagnosis not present

## 2023-04-14 DIAGNOSIS — S8991XA Unspecified injury of right lower leg, initial encounter: Secondary | ICD-10-CM | POA: Diagnosis not present

## 2023-04-14 NOTE — ED Triage Notes (Signed)
Pt reports right knee pain after falling down onto it when she was working out. She is ambulatory with independent steady gait.

## 2023-04-14 NOTE — BH Specialist Note (Signed)
Integrated Behavioral Health via Telemedicine Visit  04/14/2023 Teresa Perez [id number]  Number of Integrated Behavioral Health Clinician visits: 2- Second Visit  Session Start time: 1500   Session End time: 1600  Total time in minutes: 60   Referring Provider: Rudene Christians, DO Patient/Family location: Home [facility] Provider location: [facility] All persons participating in visit: [facility] and Patient Types of Service: Telephone visit and General Behavioral Integrated Care (BHI)  I connected with Teresa Perez and/or Teresa Perez's  via  Telephone or Video Enabled Telemedicine Application  (Video is Caregility application) and verified that I am speaking with the correct person using two identifiers. Discussed confidentiality: Yes   I discussed the limitations of telemedicine and the availability of in person appointments.  Discussed there is a possibility of technology failure and discussed alternative modes of communication if that failure occurs.  I discussed that engaging in this telemedicine visit, they consent to the provision of behavioral healthcare and the services will be billed under their insurance.  Patient and/or legal guardian expressed understanding and consented to Telemedicine visit: Yes   Presenting Concerns: Patient and/or family reports the following symptoms/concerns: Patient expressed having grief due to mother passing of cancer. [facility] discussed grief with patient and referred patient to grief support through the [facility].     Patient and/or Family's Strengths/Protective Factors: Sense of purpose  Goals Addressed: Patient will:  Reduce symptoms of:  Grief    Increase knowledge and/or ability of: coping skills   Demonstrate ability to: Increase healthy adjustment to current life circumstances  Progress towards Goals: Ongoing  Interventions: Interventions utilized:  Motivational Interviewing, Mindfulness or Management consultant, and Link to [employer] Standardized Assessments completed: PHQ-SADS     03/16/2023    4:27 PM 03/16/2023    4:26 PM 02/09/2023   10:30 AM  PHQ-SADS Last 3 Score only  Total GAD-7 Score 12  17  PHQ Adolescent Score  10 17     Patient and/or Family Response: Patient agrees to continue services with [facility].   Assessment: Patient currently experiencing Grief and anxiety.   Patient may benefit from Grief support.  Plan: Follow up with behavioral health clinician on : within the next 30 days.   I discussed the assessment and treatment plan with the patient and/or parent/guardian. They were provided an opportunity to ask questions and all were answered. They agreed with the plan and demonstrated an understanding of the instructions.   They were advised to call back or seek an in-person evaluation if the symptoms worsen or if the condition fails to improve as anticipated.  Christen Butter, MSW, LCSW-A She/Her Behavioral Health Clinician [facility]  Internal Medicine Center Direct [phone]  Fax [phone] Main Office Phone: [phone] [address] [place] [place], [place] [address] Website: [facility] Internal Medicine [facility]  [place], [place]  [place]

## 2023-04-15 ENCOUNTER — Encounter: Payer: BC Managed Care – PPO | Admitting: Student

## 2023-04-15 MED ORDER — OXYCODONE-ACETAMINOPHEN 5-325 MG PO TABS: 1.0000 | ORAL_TABLET | Freq: Three times a day (TID) | ORAL | 0 refills | Status: DC | PRN

## 2023-04-15 MED ORDER — OXYCODONE-ACETAMINOPHEN 5-325 MG PO TABS
2.0000 | ORAL_TABLET | Freq: Once | ORAL | Status: AC
  Administered 2023-04-15: 2 via ORAL
  Filled 2023-04-15: qty 2

## 2023-04-15 NOTE — ED Provider Notes (Signed)
[facility] EMERGENCY DEPARTMENT AT [facility] Provider Note   CSN: [id number] Arrival date & time: 04/14/23  2022     History  Chief Complaint  Patient presents with   Knee Pain    Teresa Perez is a 47 y.o. female.  47 year old female presents ER today with right knee pain.  Patient states that she had an episode where she twisted and sprained it a couple weeks ago.  It never really got any better.  She had pain and had been limping and then tonight she fell directly on her knee hurt it worse to presented here for further evaluation.  No known history of abnormal patella or previous patella fracture.  Strength is normal.   Knee Pain      Home Medications Prior to Admission medications   Medication Sig Start Date End Date Taking? Authorizing Provider  oxyCODONE-acetaminophen (PERCOCET) 5-325 MG tablet Take 1-2 tablets by mouth every 8 (eight) hours as needed. 04/15/23  Yes Nayanna Seaborn, Barbara Cower, MD  amLODipine (NORVASC) 5 MG tablet Take 1 tablet (5 mg total) by mouth daily. 11/18/22   Omar Person, MD  atorvastatin (LIPITOR) 20 MG tablet TAKE 1 TABLET (20 MG TOTAL) BY MOUTH AT BEDTIME. 06/06/20 11/18/22  Opalski, Gavin Pound, DO  benzonatate (TESSALON) 100 MG capsule Take 2 capsules (200 mg total) by mouth 3 (three) times daily as needed for cough. 11/18/22   Omar Person, MD  citalopram (CELEXA) 40 MG tablet TAKE 1 TABLET BY MOUTH EVERY DAY 03/09/23   Masters, Florentina Addison, DO  iron polysaccharides (NU-IRON) 150 MG capsule Take 1 capsule (150 mg total) by mouth every other day. 02/09/23   Masters, Florentina Addison, DO  norethindrone-ethinyl estradiol-FE (LOESTRIN FE 1/20) 1-20 MG-MCG tablet Take 1 tablet by mouth daily. 02/09/23   Masters, Katie, DO  PROAIR HFA 108 (90 Base) MCG/ACT inhaler Inhale 2 puffs into the lungs every 4 (four) hours as needed.    [provider]  valsartan (DIOVAN) 80 MG tablet Take 1 tablet (80 mg total) by mouth daily. 02/09/23   Masters, Florentina Addison, DO  varenicline  (CHANTIX) 1 MG tablet TAKE 1 TABLET BY MOUTH EVERY DAY 12/10/22   Omar Person, MD      Allergies    Latex    Review of Systems   Review of Systems  Physical Exam Updated Vital Signs BP 133/82   Pulse 69   Temp (!) 97.2 F (36.2 C) (Temporal)   Resp 18   Ht 5\' 7"  (1.702 m)   Wt 127 kg   LMP 03/31/2023 (Approximate)   SpO2 100%   BMI 43.85 kg/m  Physical Exam Vitals and nursing note reviewed.  Constitutional:      Appearance: She is well-developed.  HENT:     Head: Normocephalic and atraumatic.  Eyes:     Pupils: Pupils are equal, round, and reactive to light.  Cardiovascular:     Rate and Rhythm: Normal rate and regular rhythm.  Pulmonary:     Effort: No respiratory distress.     Breath sounds: No stridor.  Abdominal:     General: There is no distension.  Musculoskeletal:     Cervical back: Normal range of motion.     Comments: Tenderness to palpation, no large effusion, no ecchymosis or deformities.  To lift her right leg against gravity.  Neurovascularly intact distally.  Neurological:     Mental Status: She is alert.     ED Results / Procedures / Treatments   Labs (all  labs ordered are listed, but only abnormal results are displayed) Labs Reviewed - No data to display  EKG None  Radiology DG Knee Complete 4 Views Right  Result Date: 04/14/2023 CLINICAL DATA:  Trauma to the right knee. EXAM: RIGHT KNEE - COMPLETE 4+ VIEW COMPARISON:  None Available. 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 Acute fracture is less likely. Correlation with clinical exam and point tenderness recommended. No other acute fracture identified. There is no dislocation. The bones are well mineralized. No arthritic change. No joint effusion. The soft tissues are unremarkable. 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 Correlation with clinical exam  and point tenderness recommended. Electronically Signed   By: Elgie Collard M.D.   On: 04/14/2023 21:36    Procedures Procedures    Medications Ordered in ED Medications  oxyCODONE-acetaminophen (PERCOCET/ROXICET) 5-325 MG per tablet 2 tablet (2 tablets Oral Given 04/15/23 0522)    ED Course/ Medical Decision Making/ A&P                                 Medical Decision Making Amount and/or Complexity of Data Reviewed Radiology: ordered.  Risk Prescription drug management.   Chronic right patellar fracture versus anatomic variant.  Able to lift her leg.  Knee immobilizer placed.  Crutches provided.  Suspect knee sprain but will need MRI to verify.  Refer to Ortho.  Short course of pain medications in the meantime but will try to rely on Tylenol and ibuprofen.   Final Clinical Impression(s) / ED Diagnoses Final diagnoses:  Acute pain of right knee    Rx / DC Orders ED Discharge Orders          Ordered    oxyCODONE-acetaminophen (PERCOCET) 5-325 MG tablet  Every 8 hours PRN        04/15/23 0703    Ambulatory referral to Orthopedics        04/15/23 0703              Christopherjame Carnell, Barbara Cower, MD 04/15/23 [phone]

## 2023-04-15 NOTE — ED Notes (Signed)
Ortho at bedside.

## 2023-04-15 NOTE — ED Notes (Signed)
Called ortho tech 

## 2023-04-15 NOTE — Progress Notes (Signed)
Orthopedic Tech Progress Note Patient Details:  Teresa Perez Jun 29, 1976 [id number]  Ortho Devices Type of Ortho Device: Knee Immobilizer, Crutches Ortho Device/Splint Location: RLE Ortho Device/Splint Interventions: Ordered, Application, Adjustment   Post Interventions Instructions Provided: Care of device, Adjustment of device, Poper ambulation with device  [place] A Gerilyn Pilgrim 04/15/2023, 5:45 AM

## 2023-04-22 ENCOUNTER — Inpatient Hospital Stay (HOSPITAL_COMMUNITY): Admission: RE | Admit: 2023-04-22 | Payer: BC Managed Care – PPO | Source: Ambulatory Visit

## 2023-04-27 ENCOUNTER — Ambulatory Visit: Payer: BC Managed Care – PPO | Admitting: Licensed Clinical Social Worker

## 2023-04-27 DIAGNOSIS — F32A Depression, unspecified: Secondary | ICD-10-CM | POA: Diagnosis not present

## 2023-04-27 NOTE — BH Specialist Note (Unsigned)
Integrated Behavioral Health via Telemedicine Visit  04/27/2023 Teresa Perez [id number]  Number of Integrated Behavioral Health Clinician visits: 2- Second Visit  Session Start time: 1330   Session End time: 1430  Total time in minutes: 60   Referring Provider: Rudene Christians, DO Patient/Family location: Home [facility] Provider location: [facility] All persons participating in visit: [facility] and Patient Types of Service: Telephone visit and General Behavioral Integrated Care (BHI)   I connected with Teresa Perez and/or Teresa Perez's  via  Telephone or Video Enabled Telemedicine Application  (Video is Caregility application) and verified that I am speaking with the correct person using two identifiers. Discussed confidentiality: Yes    I discussed the limitations of telemedicine and the availability of in person appointments.  Discussed there is a possibility of technology failure and discussed alternative modes of communication if that failure occurs.   I discussed that engaging in this telemedicine visit, they consent to the provision of behavioral healthcare and the services will be billed under their insurance.   Patient and/or legal guardian expressed understanding and consented to Telemedicine visit: Yes    Presenting Concerns: Patient and/or family reports the following symptoms/concerns: Patient discussed kids returning to school and being involved in different programs. Patient advised she is still having issues with sleeping. Patient and  [facility] discussed coping mechanism.  Patient advised she has a decreased appetite. Patient continues to work through grieving.       Patient and/or Family's Strengths/Protective Factors: Sense of purpose   Goals Addressed: Patient will:  Reduce symptoms of:  Grief    Increase knowledge and/or ability of: coping skills   Demonstrate ability to: Increase healthy adjustment to current life circumstances   Progress towards Goals: Ongoing    Interventions: Interventions utilized:  Motivational Interviewing, Mindfulness or Management consultant, and Link to [employer] Standardized Assessments completed: PHQ-SADS       03/16/2023    4:27 PM 03/16/2023    4:26 PM 02/09/2023   10:30 AM  PHQ-SADS Last 3 Score only  Total GAD-7 Score 12   17  PHQ Adolescent Score   10 17      Patient and/or Family Response: Patient agrees to continue services with [facility].    Assessment: Patient currently experiencing Grief and anxiety.    Patient may benefit from Grief support.   Plan: Follow up with behavioral health clinician on : within the next 30 days.    I discussed the assessment and treatment plan with the patient and/or parent/guardian. They were provided an opportunity to ask questions and all were answered. They agreed with the plan and demonstrated an understanding of the instructions.   They were advised to call back or seek an in-person evaluation if the symptoms worsen or if the condition fails to improve as anticipated.   Christen Butter, MSW, LCSW-A She/Her Behavioral Health Clinician [facility]  Internal Medicine Center Direct Dial:[phone]  Fax [phone] Main Office Phone: [phone] [address] [place] [place], [place] [address] Website: [facility] Internal Medicine [facility]  [place], [place]  [place]

## 2023-04-29 ENCOUNTER — Encounter: Payer: BC Managed Care – PPO | Admitting: Internal Medicine

## 2023-04-30 DIAGNOSIS — Z6841 Body Mass Index (BMI) 40.0 and over, adult: Secondary | ICD-10-CM | POA: Diagnosis not present

## 2023-04-30 DIAGNOSIS — M1711 Unilateral primary osteoarthritis, right knee: Secondary | ICD-10-CM | POA: Diagnosis not present

## 2023-05-05 ENCOUNTER — Telehealth: Payer: BC Managed Care – PPO | Admitting: Family Medicine

## 2023-05-05 DIAGNOSIS — U071 COVID-19: Secondary | ICD-10-CM | POA: Diagnosis not present

## 2023-05-05 MED ORDER — MOLNUPIRAVIR EUA 200MG CAPSULE: 4.0000 | ORAL_CAPSULE | Freq: Two times a day (BID) | ORAL | 0 refills | Status: AC

## 2023-05-05 NOTE — Progress Notes (Signed)
Virtual Visit Consent   [place], you are scheduled for a virtual visit with a [facility] provider today. Just as with appointments in the office, your consent must be obtained to participate. Your consent will be active for this visit and any virtual visit you may have with one of our providers in the next 365 days. If you have a MyChart account, a copy of this consent can be sent to you electronically.  As this is a virtual visit, video technology does not allow for your provider to perform a traditional examination. This may limit your provider's ability to fully assess your condition. If your provider identifies any concerns that need to be evaluated in person or the need to arrange testing (such as labs, EKG, etc.), we will make arrangements to do so. Although advances in technology are sophisticated, we cannot ensure that it will always work on either your end or our end. If the connection with a video visit is poor, the visit may have to be switched to a telephone visit. With either a video or telephone visit, we are not always able to ensure that we have a secure connection.  By engaging in this virtual visit, you consent to the provision of healthcare and authorize for your insurance to be billed (if applicable) for the services provided during this visit. Depending on your insurance coverage, you may receive a charge related to this service.  I need to obtain your verbal consent now. Are you willing to proceed with your visit today? Teresa Perez has provided verbal consent on 05/05/2023 for a virtual visit (video or telephone). Freddy Finner, NP  Date: 05/05/2023 12:19 PM  Virtual Visit via Video Note   I, Freddy Finner, connected with  Teresa Perez  ([id number], [id number]) on 05/05/23 at 12:15 PM EDT by a video-enabled telemedicine application and verified that I am speaking with the correct person using two identifiers.  Location: Patient: Virtual Visit Location Patient: Home Provider:  Virtual Visit Location Provider: Home Office   I discussed the limitations of evaluation and management by telemedicine and the availability of in person appointments. The patient expressed understanding and agreed to proceed.    History of Present Illness: Teresa Perez is a 47 y.o. who identifies as a female who was assigned female at birth, and is being seen today for covid +  Onset was last night- sore throat Associated symptoms are body aches, headaches, mild cough, nasal congestion, chills  Modifying factors are nothing  Denies chest pain, shortness of breath, fevers  Exposure to sick contacts- known with a son at home sick (was also exposed to COVID over weekend) COVID test: + positive this morning  Vaccines: originally series   Problems:  Patient Active Problem List   Diagnosis Date Noted   Weight gain 03/16/2023   Chronic cough 03/16/2023   Psoriasis 02/09/2023   Tobacco use 02/09/2023   Anxious mood as adjustment reaction 06/06/2020   Hypertension 05/23/2020   Hyperlipidemia 05/23/2020   GAD (generalized anxiety disorder) 05/23/2020   Adjustment reaction with anxiety and depression 07/29/2017   Borderline blood pressure 07/29/2017   Type 2 diabetes mellitus with hyperlipidemia (HCC) 07/29/2017   Gestational diabetes mellitus (GDM), antepartum 08/17/2016   History of conization of cervix affecting pregnancy, antepartum 08/05/2016   Vitamin D deficiency 01/09/2014   Depression 01/02/2014   PCOS (polycystic ovarian syndrome) 01/02/2014   Anemia 06/24/2013   Severe obesity (BMI >= 40) (HCC) 06/22/2013    Allergies:  Allergies  Allergen Reactions   Latex Rash and Itching   Medications:  Current Outpatient Medications:    amLODipine (NORVASC) 5 MG tablet, Take 1 tablet (5 mg total) by mouth daily., Disp: 30 tablet, Rfl: 11   atorvastatin (LIPITOR) 20 MG tablet, TAKE 1 TABLET (20 MG TOTAL) BY MOUTH AT BEDTIME., Disp: 30 tablet, Rfl: 0   benzonatate (TESSALON) 100 MG  capsule, Take 2 capsules (200 mg total) by mouth 3 (three) times daily as needed for cough., Disp: 60 capsule, Rfl: 1   citalopram (CELEXA) 40 MG tablet, TAKE 1 TABLET BY MOUTH EVERY DAY, Disp: 90 tablet, Rfl: 1   iron polysaccharides (NU-IRON) 150 MG capsule, Take 1 capsule (150 mg total) by mouth every other day., Disp: 30 capsule, Rfl: 3   norethindrone-ethinyl estradiol-FE (LOESTRIN FE 1/20) 1-20 MG-MCG tablet, Take 1 tablet by mouth daily., Disp: 28 tablet, Rfl: 6   oxyCODONE-acetaminophen (PERCOCET) 5-325 MG tablet, Take 1-2 tablets by mouth every 8 (eight) hours as needed., Disp: 20 tablet, Rfl: 0   PROAIR HFA 108 (90 Base) MCG/ACT inhaler, Inhale 2 puffs into the lungs every 4 (four) hours as needed., Disp: , Rfl:    valsartan (DIOVAN) 80 MG tablet, Take 1 tablet (80 mg total) by mouth daily., Disp: 30 tablet, Rfl: 3   varenicline (CHANTIX) 1 MG tablet, TAKE 1 TABLET BY MOUTH EVERY DAY, Disp: 90 tablet, Rfl: 1  Observations/Objective: Patient is well-developed, well-nourished in no acute distress.  Resting comfortably  at home.  Head is normocephalic, atraumatic.  No labored breathing.  Speech is clear and coherent with logical content.  Patient is alert and oriented at baseline.   Assessment and Plan:   1. COVID-19  - molnupiravir EUA (LAGEVRIO) 200 mg CAPS capsule; Take 4 capsules (800 mg total) by mouth 2 (two) times daily for 5 days.  Dispense: 40 capsule; Refill: 0  - Continue OTC symptomatic management of choice - Info for COVID sent on AVS as well - Take prescribed medications as directed - Push fluids - Rest as needed - Discussed return precautions and when to seek in-person evaluation, sent via AVS as well  Reviewed side effects, risks and benefits of medication.    Patient acknowledged agreement and understanding of the plan.   Past Medical, Surgical, Social History, Allergies, and Medications have been Reviewed.    Follow Up Instructions: I discussed the  assessment and treatment plan with the patient. The patient was provided an opportunity to ask questions and all were answered. The patient agreed with the plan and demonstrated an understanding of the instructions.  A copy of instructions were sent to the patient via MyChart unless otherwise noted below.     The patient was advised to call back or seek an in-person evaluation if the symptoms worsen or if the condition fails to improve as anticipated.  Time:  I spent 10 minutes with the patient via telehealth technology discussing the above problems/concerns.    Freddy Finner, NP

## 2023-05-05 NOTE — Patient Instructions (Addendum)
Waylan Boga, thank you for joining Freddy Finner, NP for today's virtual visit.  While this provider is not your primary care provider (PCP), if your PCP is located in our provider database this encounter information will be shared with them immediately following your visit.   A [facility] MyChart account gives you access to today's visit and all your visits, tests, and labs performed at [facility] " click here if you don't have a [facility] MyChart account or go to [email or web address]  Consent: (Patient) Mikeshia Zotter provided verbal consent for this virtual visit at the beginning of the encounter.  Current Medications:  Current Outpatient Medications:    molnupiravir EUA (LAGEVRIO) 200 mg CAPS capsule, Take 4 capsules (800 mg total) by mouth 2 (two) times daily for 5 days., Disp: 40 capsule, Rfl: 0   amLODipine (NORVASC) 5 MG tablet, Take 1 tablet (5 mg total) by mouth daily., Disp: 30 tablet, Rfl: 11   atorvastatin (LIPITOR) 20 MG tablet, TAKE 1 TABLET (20 MG TOTAL) BY MOUTH AT BEDTIME., Disp: 30 tablet, Rfl: 0   benzonatate (TESSALON) 100 MG capsule, Take 2 capsules (200 mg total) by mouth 3 (three) times daily as needed for cough., Disp: 60 capsule, Rfl: 1   citalopram (CELEXA) 40 MG tablet, TAKE 1 TABLET BY MOUTH EVERY DAY, Disp: 90 tablet, Rfl: 1   iron polysaccharides (NU-IRON) 150 MG capsule, Take 1 capsule (150 mg total) by mouth every other day., Disp: 30 capsule, Rfl: 3   norethindrone-ethinyl estradiol-FE (LOESTRIN FE 1/20) 1-20 MG-MCG tablet, Take 1 tablet by mouth daily., Disp: 28 tablet, Rfl: 6   oxyCODONE-acetaminophen (PERCOCET) 5-325 MG tablet, Take 1-2 tablets by mouth every 8 (eight) hours as needed., Disp: 20 tablet, Rfl: 0   PROAIR HFA 108 (90 Base) MCG/ACT inhaler, Inhale 2 puffs into the lungs every 4 (four) hours as needed., Disp: , Rfl:    valsartan (DIOVAN) 80 MG tablet, Take 1 tablet (80 mg total) by mouth daily., Disp: 30 tablet, Rfl: 3    varenicline (CHANTIX) 1 MG tablet, TAKE 1 TABLET BY MOUTH EVERY DAY, Disp: 90 tablet, Rfl: 1   Medications ordered in this encounter:  Meds ordered this encounter  Medications   molnupiravir EUA (LAGEVRIO) 200 mg CAPS capsule    Sig: Take 4 capsules (800 mg total) by mouth 2 (two) times daily for 5 days.    Dispense:  40 capsule    Refill:  0    Order Specific Question:   Supervising Provider    Answer:   Merrilee Jansky [id number]     *If you need refills on other medications prior to your next appointment, please contact your pharmacy*  Follow-Up: Call back or seek an in-person evaluation if the symptoms worsen or if the condition fails to improve as anticipated.  [facility] [phone]  Care Instructions:  - Continue OTC symptomatic management of choice  - Take prescribed medications as directed - Push fluids - Rest as needed     Isolation Instructions: You are to isolate at home until you have been fever free for at least 24 hours without a fever-reducing medication, and symptoms have been steadily improving for 24 hours. At that time,  you can end isolation but need to mask for an additional 5 days.   If you must be around other household members who do not have symptoms, you need to make sure that both you and the family members are masking consistently with a high-quality  mask.  If you note any worsening of symptoms despite treatment, please seek an in-person evaluation ASAP. If you note any significant shortness of breath or any chest pain, please seek ER evaluation. Please do not delay care!   COVID-19: What to Do if You Are Sick If you test positive and are an older adult or someone who is at high risk of getting very sick from COVID-19, treatment may be available. Contact a healthcare provider right away after a positive test to determine if you are eligible, even if your symptoms are mild right now. You can also visit a Test to Treat location and,  if eligible, receive a prescription from a provider. Don't delay: Treatment must be started within the first few days to be effective. If you have a fever, cough, or other symptoms, you might have COVID-19. Most people have mild illness and are able to recover at home. If you are sick: Keep track of your symptoms. If you have an emergency warning sign (including trouble breathing), call 911. Steps to help prevent the spread of COVID-19 if you are sick If you are sick with COVID-19 or think you might have COVID-19, follow the steps below to care for yourself and to help protect other people in your home and community. Stay home except to get medical care Stay home. Most people with COVID-19 have mild illness and can recover at home without medical care. Do not leave your home, except to get medical care. Do not visit public areas and do not go to places where you are unable to wear a mask. Take care of yourself. Get rest and stay hydrated. Take over-the-counter medicines, such as acetaminophen, to help you feel better. Stay in touch with your doctor. Call before you get medical care. Be sure to get care if you have trouble breathing, or have any other emergency warning signs, or if you think it is an emergency. Avoid public transportation, ride-sharing, or taxis if possible. Get tested If you have symptoms of COVID-19, get tested. While waiting for test results, stay away from others, including staying apart from those living in your household. Get tested as soon as possible after your symptoms start. Treatments may be available for people with COVID-19 who are at risk for becoming very sick. Don't delay: Treatment must be started early to be effective--some treatments must begin within 5 days of your first symptoms. Contact your healthcare provider right away if your test result is positive to determine if you are eligible. Self-tests are one of several options for testing for the virus that causes  COVID-19 and may be more convenient than laboratory-based tests and point-of-care tests. Ask your healthcare provider or your local health department if you need help interpreting your test results. You can visit your state, tribal, local, and territorial health department's website to look for the latest local information on testing sites. Separate yourself from other people As much as possible, stay in a specific room and away from other people and pets in your home. If possible, you should use a separate bathroom. If you need to be around other people or animals in or outside of the home, wear a well-fitting mask. Tell your close contacts that they may have been exposed to COVID-19. An infected person can spread COVID-19 starting 48 hours (or 2 days) before the person has any symptoms or tests positive. By letting your close contacts know they may have been exposed to COVID-19, you are helping to protect everyone. See  COVID-19 and Animals if you have questions about pets. If you are diagnosed with COVID-19, someone from the health department may call you. Answer the call to slow the spread. Monitor your symptoms Symptoms of COVID-19 include fever, cough, or other symptoms. Follow care instructions from your healthcare provider and local health department. Your local health authorities may give instructions on checking your symptoms and reporting information. When to seek emergency medical attention Look for emergency warning signs* for COVID-19. If someone is showing any of these signs, seek emergency medical care immediately: Trouble breathing Persistent pain or pressure in the chest New confusion Inability to wake or stay awake Pale, gray, or blue-colored skin, lips, or nail beds, depending on skin tone *This list is not all possible symptoms. Please call your medical provider for any other symptoms that are severe or concerning to you. Call 911 or call ahead to your local emergency facility:  Notify the operator that you are seeking care for someone who has or may have COVID-19. Call ahead before visiting your doctor Call ahead. Many medical visits for routine care are being postponed or done by phone or telemedicine. If you have a medical appointment that cannot be postponed, call your doctor's office, and tell them you have or may have COVID-19. This will help the office protect themselves and other patients. If you are sick, wear a well-fitting mask You should wear a mask if you must be around other people or animals, including pets (even at home). Wear a mask with the best fit, protection, and comfort for you. You don't need to wear the mask if you are alone. If you can't put on a mask (because of trouble breathing, for example), cover your coughs and sneezes in some other way. Try to stay at least 6 feet away from other people. This will help protect the people around you. Masks should not be placed on young children under age 42 years, anyone who has trouble breathing, or anyone who is not able to remove the mask without help. Cover your coughs and sneezes Cover your mouth and nose with a tissue when you cough or sneeze. Throw away used tissues in a lined trash can. Immediately wash your hands with soap and water for at least 20 seconds. If soap and water are not available, clean your hands with an alcohol-based hand sanitizer that contains at least 60% alcohol. Clean your hands often Wash your hands often with soap and water for at least 20 seconds. This is especially important after blowing your nose, coughing, or sneezing; going to the bathroom; and before eating or preparing food. Use hand sanitizer if soap and water are not available. Use an alcohol-based hand sanitizer with at least 60% alcohol, covering all surfaces of your hands and rubbing them together until they feel dry. Soap and water are the best option, especially if hands are visibly dirty. Avoid touching your eyes,  nose, and mouth with unwashed hands. Handwashing Tips Avoid sharing personal household items Do not share dishes, drinking glasses, cups, eating utensils, towels, or bedding with other people in your home. Wash these items thoroughly after using them with soap and water or put in the dishwasher. Clean surfaces in your home regularly Clean and disinfect high-touch surfaces (for example, doorknobs, tables, handles, light switches, and countertops) in your "sick room" and bathroom. In shared spaces, you should clean and disinfect surfaces and items after each use by the person who is ill. If you are sick and cannot clean,  a caregiver or other person should only clean and disinfect the area around you (such as your bedroom and bathroom) on an as needed basis. Your caregiver/other person should wait as long as possible (at least several hours) and wear a mask before entering, cleaning, and disinfecting shared spaces that you use. Clean and disinfect areas that may have blood, stool, or body fluids on them. Use household cleaners and disinfectants. Clean visible dirty surfaces with household cleaners containing soap or detergent. Then, use a household disinfectant. Use a product from [employer] List N: Disinfectants for Coronavirus (COVID-19). Be sure to follow the instructions on the label to ensure safe and effective use of the product. Many products recommend keeping the surface wet with a disinfectant for a certain period of time (look at "contact time" on the product label). You may also need to wear personal protective equipment, such as gloves, depending on the directions on the product label. Immediately after disinfecting, wash your hands with soap and water for 20 seconds. For completed guidance on cleaning and disinfecting your home, visit Complete Disinfection Guidance. Take steps to improve ventilation at home Improve ventilation (air flow) at home to help prevent from spreading COVID-19 to other  people in your household. Clear out COVID-19 virus particles in the air by opening windows, using air filters, and turning on fans in your home. Use this interactive tool to learn how to improve air flow in your home. When you can be around others after being sick with COVID-19 Deciding when you can be around others is different for different situations. Find out when you can safely end home isolation. For any additional questions about your care, contact your healthcare provider or state or local health department. 11/19/2020 Content source: [facility] for Immunization and Respiratory Diseases ([facility]), Division of Viral Diseases This information is not intended to replace advice given to you by your health care provider. Make sure you discuss any questions you have with your health care provider. Document Revised: 01/02/2021 Document Reviewed: 01/02/2021 Elsevier Patient Education  2022 [employer].     If you have been instructed to have an in-person evaluation today at a local Urgent Care facility, please use the link below. It will take you to a list of all of our available [facility], including address, phone number and hours of operation. Please do not delay care.  [facility]  If you or a family member do not have a primary care provider, use the link below to schedule a visit and establish care. When you choose a [facility] primary care physician or advanced practice provider, you gain a long-term partner in health. Find a Primary Care Provider  Learn more about [facility]'s in-office and virtual care options: [facility] - Get Care Now

## 2023-05-14 ENCOUNTER — Inpatient Hospital Stay (HOSPITAL_COMMUNITY): Admission: RE | Admit: 2023-05-14 | Payer: BC Managed Care – PPO | Source: Ambulatory Visit

## 2023-05-16 DIAGNOSIS — M25561 Pain in right knee: Secondary | ICD-10-CM | POA: Diagnosis not present

## 2023-05-18 ENCOUNTER — Ambulatory Visit: Payer: BC Managed Care – PPO | Admitting: Licensed Clinical Social Worker

## 2023-05-25 DIAGNOSIS — M1711 Unilateral primary osteoarthritis, right knee: Secondary | ICD-10-CM | POA: Diagnosis not present

## 2023-06-01 ENCOUNTER — Ambulatory Visit (HOSPITAL_COMMUNITY)
Admission: RE | Admit: 2023-06-01 | Discharge: 2023-06-01 | Disposition: A | Discharge status: Home / Self Care | Payer: BC Managed Care – PPO | Source: Ambulatory Visit | Attending: Student in an Organized Health Care Education/Training Program | Admitting: Student in an Organized Health Care Education/Training Program

## 2023-06-01 DIAGNOSIS — R053 Chronic cough: Secondary | ICD-10-CM | POA: Insufficient documentation

## 2023-06-01 LAB — PULMONARY FUNCTION TEST
DL/VA % pred: 122 %
DL/VA: 5.21 ml/min/mmHg/L
DLCO unc % pred: 100 %
DLCO unc: 24.13 ml/min/mmHg
FEF 25-75 Post: 2.93 L/s
FEF 25-75 Pre: 2.64 L/s
FEF2575-%Change-Post: 11 %
FEF2575-%Pred-Post: 95 %
FEF2575-%Pred-Pre: 85 %
FEV1-%Change-Post: 2 %
FEV1-%Pred-Post: 78 %
FEV1-%Pred-Pre: 76 %
FEV1-Post: 2.51 L
FEV1-Pre: 2.45 L
FEV1FVC-%Change-Post: -2 %
FEV1FVC-%Pred-Pre: 105 %
FEV6-%Change-Post: 5 %
FEV6-%Pred-Post: 77 %
FEV6-%Pred-Pre: 73 %
FEV6-Post: 3.04 L
FEV6-Pre: 2.88 L
FEV6FVC-%Pred-Post: 102 %
FEV6FVC-%Pred-Pre: 102 %
FVC-%Change-Post: 5 %
FVC-%Pred-Post: 75 %
FVC-%Pred-Pre: 71 %
FVC-Post: 3.04 L
FVC-Pre: 2.88 L
Post FEV1/FVC ratio: 83 %
Post FEV6/FVC ratio: 100 %
Pre FEV1/FVC ratio: 85 %
Pre FEV6/FVC Ratio: 100 %
RV % pred: 48 %
RV: 0.93 L
TLC % pred: 77 %
TLC: 4.34 L

## 2023-06-01 MED ORDER — ALBUTEROL SULFATE (2.5 MG/3ML) 0.083% IN NEBU
2.5000 mg | INHALATION_SOLUTION | Freq: Once | RESPIRATORY_TRACT | Status: AC
Start: 2023-06-01 — End: 2023-06-01
  Administered 2023-06-01: 2.5 mg via RESPIRATORY_TRACT

## 2023-06-07 ENCOUNTER — Telehealth: Payer: Self-pay | Admitting: Internal Medicine

## 2023-06-07 DIAGNOSIS — Z716 Tobacco abuse counseling: Secondary | ICD-10-CM

## 2023-06-07 MED ORDER — NICOTINE 14 MG/24HR TD PT24
14.0000 mg | MEDICATED_PATCH | TRANSDERMAL | 0 refills | Status: AC
Start: 2023-06-07 — End: 2024-07-18

## 2023-06-07 NOTE — Telephone Encounter (Signed)
I reviewed PFT results with patient. This was ordered in setting of chronic cough and shortness of breath. TLC is 77% predicted. FEV1 and FVC are reduced but ratio is increased. There appears to be minimal airway obstruction with minimal restriction.  A: Since patient was seen in July, she reports that both coughing and shortness of breath symptoms have improved. She feels winded when going up and down stairs but is concerned this is more related to her weight. She is able to walk for 30 minutes straight without having to stop but has noted that she feels short of breath after walking at times. She stopped smoking in July as she was concerned about symptoms and because her mom had lung cancer. Since then she has restarted smoking. I am concerned that smoking is contributing to obstructive/ restrictive pattern.     P: She plans to restart chantix which she has at home. NRT with patch ordered. Currently smokes 5-6 cigarettes daily so will start with 14 mg daily for 6 weeks (switch to 7 mg after 11/11) F/u in 3 weeks for smoking cessation counseling and diabetes.

## 2023-06-15 DIAGNOSIS — M25561 Pain in right knee: Secondary | ICD-10-CM | POA: Diagnosis not present

## 2023-06-24 ENCOUNTER — Ambulatory Visit (INDEPENDENT_AMBULATORY_CARE_PROVIDER_SITE_OTHER): Payer: BC Managed Care – PPO | Admitting: Licensed Clinical Social Worker

## 2023-06-24 DIAGNOSIS — F32A Depression, unspecified: Secondary | ICD-10-CM

## 2023-06-30 NOTE — BH Specialist Note (Signed)
Patient no-showed today's appointment; appointment was for Telephone visit at 3:30pm  Behavioral Health Clinician [facility] from here on out)  attempted patient via Telephone .[facility] contacted patient from telephone number [phone]. [facility] left a  VM.   Patient will need to reschedule appointment by calling Internal medicine center [phone].  Christen Butter, MSW, LCSW-A She/Her Behavioral Health Clinician [facility]  Internal Medicine Center Direct [phone]  Fax [phone] Main Office Phone: [phone] [address] [place], [place] [address] Website: [facility] Internal [facility]  [place], [place]  [place]

## 2023-07-02 ENCOUNTER — Ambulatory Visit: Payer: BC Managed Care – PPO | Admitting: Internal Medicine

## 2023-07-02 ENCOUNTER — Other Ambulatory Visit: Payer: Self-pay | Admitting: Internal Medicine

## 2023-07-02 VITALS — BP 145/84 | HR 76 | Temp 98.1°F | Ht 66.0 in | Wt 275.0 lb

## 2023-07-02 DIAGNOSIS — Z23 Encounter for immunization: Secondary | ICD-10-CM

## 2023-07-02 DIAGNOSIS — F17210 Nicotine dependence, cigarettes, uncomplicated: Secondary | ICD-10-CM

## 2023-07-02 DIAGNOSIS — Z720 Tobacco use: Secondary | ICD-10-CM

## 2023-07-02 DIAGNOSIS — L409 Psoriasis, unspecified: Secondary | ICD-10-CM

## 2023-07-02 DIAGNOSIS — Z7985 Long-term (current) use of injectable non-insulin antidiabetic drugs: Secondary | ICD-10-CM

## 2023-07-02 DIAGNOSIS — A630 Anogenital (venereal) warts: Secondary | ICD-10-CM | POA: Diagnosis not present

## 2023-07-02 DIAGNOSIS — E1169 Type 2 diabetes mellitus with other specified complication: Secondary | ICD-10-CM

## 2023-07-02 DIAGNOSIS — E785 Hyperlipidemia, unspecified: Secondary | ICD-10-CM | POA: Diagnosis not present

## 2023-07-02 LAB — GLUCOSE, CAPILLARY: Glucose-Capillary: 342 mg/dL — ABNORMAL HIGH (ref 70–99)

## 2023-07-02 LAB — POCT GLYCOSYLATED HEMOGLOBIN (HGB A1C): Hemoglobin A1C: 9.6 % — AB (ref 4.0–5.6)

## 2023-07-02 MED ORDER — EMPAGLIFLOZIN 10 MG PO TABS: 10.0000 mg | ORAL_TABLET | Freq: Every day | ORAL | 11 refills | Status: DC

## 2023-07-02 MED ORDER — TRIAMCINOLONE ACETONIDE 0.5 % EX OINT: 1.0000 | TOPICAL_OINTMENT | Freq: Two times a day (BID) | CUTANEOUS | 3 refills | Status: DC

## 2023-07-02 MED ORDER — IMIQUIMOD 5 % EX CREA: TOPICAL_CREAM | CUTANEOUS | 0 refills | Status: DC

## 2023-07-02 MED ORDER — MOUNJARO 2.5 MG/0.5ML ~~LOC~~ SOAJ
2.5000 mg | SUBCUTANEOUS | 3 refills | Status: DC
Start: 2023-07-02 — End: 2023-09-06

## 2023-07-02 NOTE — Progress Notes (Signed)
Subjective:  CC: diabetes follow-up  HPI:  [name] is a 47 y.o. female with a past medical history stated below and presents today for diabetes.   Please see problem based assessment and plan for additional details.  Past Medical History:  Diagnosis Date   Acute bacterial bronchitis 11/05/2015   Anemia    Anxiety    Asthma    childhood asthma   Back pain    Boil    tendency to form boils   Depression    has been treated in past-no present meds   Family history of anesthesia complication 2008   son- age 62 - "had lockjaw" after surgery (T&A)   Gestational diabetes mellitus (GDM), antepartum 08/17/2016   Fasting on 2 hour was 107   Headache(784.0)    Hyperglycemia    Hypertriglyceridemia    Menorrhagia 10/29/2015   Mixed diabetic hyperlipidemia associated with type 2 diabetes mellitus (HCC) 05/23/2020   PCOS (polycystic ovarian syndrome)    Pneumonia    AS A CHILD   Prediabetes     Current Outpatient Medications on File Prior to Visit  Medication Sig Dispense Refill   amLODipine (NORVASC) 5 MG tablet Take 1 tablet (5 mg total) by mouth daily. 30 tablet 11   atorvastatin (LIPITOR) 20 MG tablet TAKE 1 TABLET (20 MG TOTAL) BY MOUTH AT BEDTIME. 30 tablet 0   benzonatate (TESSALON) 100 MG capsule Take 2 capsules (200 mg total) by mouth 3 (three) times daily as needed for cough. 60 capsule 1   citalopram (CELEXA) 40 MG tablet TAKE 1 TABLET BY MOUTH EVERY DAY 90 tablet 1   nicotine (NICODERM CQ - DOSED IN MG/24 HOURS) 14 mg/24hr patch Place 1 patch (14 mg total) onto the skin daily. 42 patch 0   norethindrone-ethinyl estradiol-FE (LOESTRIN FE 1/20) 1-20 MG-MCG tablet Take 1 tablet by mouth daily. 28 tablet 6   PROAIR HFA 108 (90 Base) MCG/ACT inhaler Inhale 2 puffs into the lungs every 4 (four) hours as needed.     valsartan (DIOVAN) 80 MG tablet Take 1 tablet (80 mg total) by mouth daily. 30 tablet 3   varenicline (CHANTIX) 1 MG tablet TAKE 1 TABLET BY MOUTH EVERY DAY 90  tablet 1   No current facility-administered medications on file prior to visit.    Family History  Problem Relation Age of Onset   Diabetes Mother    Hypertension Mother    Hyperlipidemia Mother    Arthritis Mother    Anxiety disorder Mother    Depression Mother    Lung cancer Mother        smoked   Hypertension Father    Anxiety disorder Father    Hypertension Paternal Grandmother    Hyperlipidemia Paternal Grandmother    Anesthesia problems Son        son-had T&A-2008-had trismus    Social History   Socioeconomic History   Marital status: Single    Spouse name: Not on file   Number of children: Not on file   Years of education: Not on file   Highest education level: Not on file  Occupational History   Occupation: Customer Care Rep  Tobacco Use   Smoking status: Every Day    Current packs/day: 0.10    Average packs/day: 0.1 packs/day for 28.0 years (2.8 ttl pk-yrs)    Types: Cigarettes    Passive exposure: Past   Smokeless tobacco: Never   Tobacco comments:    1 pk per week  Vaping Use   Vaping status: Never Used  Substance and Sexual Activity   Alcohol use: No    Comment: occasional    Drug use: No   Sexual activity: Yes    Birth control/protection: None  Other Topics Concern   Not on file  Social History Narrative   Not on file   Social Determinants of Health   Financial Resource Strain: Not on file  Food Insecurity: No Food Insecurity (02/09/2023)   Hunger Vital Sign    Worried About Running Out of Food in the Last Year: Never true    Ran Out of Food in the Last Year: Never true  Transportation Needs: No Transportation Needs (02/09/2023)   PRAPARE - Administrator, Civil Service (Medical): No    Lack of Transportation (Non-Medical): No  Physical Activity: Not on file  Stress: Not on file  Social Connections: Socially Isolated (02/09/2023)   Social Connection and Isolation Panel [NHANES]    Frequency of Communication with Friends and  Family: More than three times a week    Frequency of Social Gatherings with Friends and Family: More than three times a week    Attends Religious Services: Never    Database administrator or Organizations: No    Attends Banker Meetings: Never    Marital Status: Divorced  Catering manager Violence: Not At Risk (02/09/2023)   Humiliation, Afraid, Rape, and Kick questionnaire    Fear of Current or Ex-Partner: No    Emotionally Abused: No    Physically Abused: No    Sexually Abused: No    Review of Systems: ROS negative except for what is noted on the assessment and plan.  Objective:   Vitals:   07/02/23 0841 07/02/23 0926  BP: (!) 152/92 (!) 145/84  Pulse: 76 76  Temp: 98.1 F (36.7 C)   TempSrc: Oral   SpO2: 100%   Weight: 275 lb (124.7 kg)   Height: 5\' 6"  (1.676 m)     Physical Exam: Constitutional: well-appearing  Cardiovascular: regular rate and rhythm, no m/r/g Pulmonary/Chest: normal work of breathing on room air, lungs clear to auscultation bilaterally Abdominal: soft, non-tender, non-distended MSK: normal bulk and tone Neurological: normal gait Skin: warm and dry   Assessment & Plan:  Type 2 diabetes mellitus with hyperlipidemia (HCC) Lab Results  Component Value Date   HGBA1C 6.5 (A) 03/16/2023   Lab Results  Component Value Date   HGBA1C 9.6 (A) 07/02/2023   She previously followed with endocrinology for diabetes. Last visit with them was 3/24. She stopped taking ozempic due to bloating. She is frustrated with her weight. On chart review she has not tolerated metformin in the past due to diarrhea. She had hypoglycemic episodes with glipizide. She asked about trying mounjaro as she saw this advertised. A: Diabetes is uncontrolled with A1c to 9.6 today. P: Start mounjaro 2.5mg , cautioned that she may have bloating with this as similar to ozempic. She would like to try it Start jardiance 10 mg Referral to ophthalmology She would like to  defere BMP to 1 month follow-up visit  Tobacco use She has restarted smoking about 3 cigarettes daily. She plans to restart chantix which she has at home. P: Restart chantix NRT  Genital warts Patient noted several bumps on labia for the last 2 weeks. They are not itchy or painful. No vaginal discharge. She is not sexually active and hasn't been since 5/24. A: Exam with verrucous lesion Genital warts P: Imiquimod  5% 3 times weekly  Psoriasis Kenolog 0.5% cream has been helpful. On exam, her rash on left lateral ankle looks improved from 06/24. P: Refill kenalog cream   Patient discussed with Dr. Wille Celeste Muriel Wilber, D.O. [facility] Internal Medicine  PGY-3 Pager: [phone]  Phone: [phone] Date 07/02/2023  Time 7:59 PM

## 2023-07-02 NOTE — Assessment & Plan Note (Signed)
Lab Results  Component Value Date   HGBA1C 6.5 (A) 03/16/2023   Lab Results  Component Value Date   HGBA1C 9.6 (A) 07/02/2023   She previously followed with endocrinology for diabetes. Last visit with them was 3/24. She stopped taking ozempic due to bloating. She is frustrated with her weight. On chart review she has not tolerated metformin in the past due to diarrhea. She had hypoglycemic episodes with glipizide. She asked about trying mounjaro as she saw this advertised. A: Diabetes is uncontrolled with A1c to 9.6 today. P: Start mounjaro 2.5mg , cautioned that she may have bloating with this as similar to ozempic. She would like to try it Start jardiance 10 mg Referral to ophthalmology She would like to defere BMP to 1 month follow-up visit

## 2023-07-02 NOTE — Assessment & Plan Note (Signed)
Patient noted several bumps on labia for the last 2 weeks. They are not itchy or painful. No vaginal discharge. She is not sexually active and hasn't been since 5/24. A: Exam with verrucous lesion Genital warts P: Imiquimod 5% 3 times weekly

## 2023-07-02 NOTE — Patient Instructions (Signed)
Thank you, Ms.Connelly Rimel for allowing [place] to provide your care today.   Diabetes- A1c was high at 9.6. I have sent in [place]. If you have side effects call me! Follow-up in 3 months Im checking you kidney function today Referral placed for eye doctor Genital bumps- I sent in imiquimod which is a cream you apply 3 x weekly to bump Psoriasis- kenolog was refilled  I have ordered the following labs for you:   Lab Orders         Glucose, capillary         BMP8+Anion Gap         POC Hbg A1C       Referrals ordered today:    Referral Orders         Ambulatory referral to Ophthalmology      I have ordered the following medication/changed the following medications:   Stop the following medications: Medications Discontinued During This Encounter  Medication Reason   oxyCODONE-acetaminophen (PERCOCET) 5-325 MG tablet    iron polysaccharides (NU-IRON) 150 MG capsule      Start the following medications: Meds ordered this encounter  Medications   tirzepatide (MOUNJARO) 2.5 MG/0.5ML Pen    Sig: Inject 2.5 mg into the skin once a week.    Dispense:  2 mL    Refill:  3   empagliflozin (JARDIANCE) 10 MG TABS tablet    Sig: Take 1 tablet (10 mg total) by mouth daily before breakfast.    Dispense:  30 tablet    Refill:  11   triamcinolone ointment (KENALOG) 0.5 %    Sig: Apply 1 Application topically 2 (two) times daily.    Dispense:  30 g    Refill:  3   imiquimod (ALDARA) 5 % cream    Sig: Apply topically 3 (three) times a week.    Dispense:  12 each    Refill:  0     Follow up: 3 months   We look forward to seeing you next time. Please call our clinic at [phone] if you have any questions or concerns. The best time to call is Monday-Friday from 9am-4pm, but there is someone available 24/7. If after hours or the weekend, call the main hospital number and ask for the Internal Medicine Resident On-Call. If you need medication refills, please notify your  pharmacy one week in advance and they will send [place] a request.   Thank you for trusting me with your care. Wishing you the best!   Rudene Christians, DO [facility]

## 2023-07-02 NOTE — Assessment & Plan Note (Addendum)
Kenolog 0.5% cream has been helpful. On exam, her rash on left lateral ankle looks improved from 06/24. P: Refill kenalog cream

## 2023-07-02 NOTE — Assessment & Plan Note (Signed)
She has restarted smoking about 3 cigarettes daily. She plans to restart chantix which she has at home. P: Restart chantix NRT

## 2023-07-05 ENCOUNTER — Other Ambulatory Visit (HOSPITAL_COMMUNITY): Payer: Self-pay

## 2023-07-05 ENCOUNTER — Telehealth: Payer: Self-pay

## 2023-07-05 NOTE — Telephone Encounter (Signed)
Pharmacy Patient Advocate Encounter   Received notification from RX Request Messages that prior authorization for [facility] is required/requested.   Insurance verification completed.   The patient is insured through [employer] .   Per test claim: PA required; PA submitted to above mentioned insurance via CoverMyMeds Key/confirmation #/EOC [id number]. Status is pending

## 2023-07-06 NOTE — Progress Notes (Signed)
Internal Medicine Clinic Attending  Case discussed with the resident at the time of the visit.  We reviewed the resident's history and exam and pertinent patient test results.  I agree with the assessment, diagnosis, and plan of care documented in the resident's note.  

## 2023-07-09 NOTE — Telephone Encounter (Signed)
Pharmacy Patient Advocate Encounter  Received notification from [facility] that Prior Authorization for Greggory Keen has been APPROVED from 07/08/23 to 07/07/24   PA #/Case ID/Reference #: [id number]

## 2023-07-15 ENCOUNTER — Ambulatory Visit: Payer: BC Managed Care – PPO | Admitting: Licensed Clinical Social Worker

## 2023-07-15 ENCOUNTER — Telehealth: Payer: Self-pay

## 2023-07-15 NOTE — Telephone Encounter (Signed)
Decision:Approved Waylan Boga ([employer]Willette [employer]ace) PA Case ID #: [id number] Need Help? Call us at [phone] Outcome Approved today by [employer] 2017 PA Case: [id number], Status: Approved, Coverage Starts on: 07/15/2023 12:00:00 AM, Coverage Ends on: 07/14/2024 12:00:00 AM. Authorization Expiration Date: 07/13/2024 Drug Jardiance 10MG  tablets ePA cloud Statistician PA Form [phone] NCPDP)

## 2023-07-15 NOTE — Telephone Encounter (Signed)
Prior Authorization for patient (Jardiance 10MG  tablets) came through on cover my meds was submitted with last office notes and labs awaiting approval or denial.  ZOX:[id number]

## 2023-08-10 ENCOUNTER — Other Ambulatory Visit: Payer: Self-pay | Admitting: Internal Medicine

## 2023-08-10 DIAGNOSIS — F32A Depression, unspecified: Secondary | ICD-10-CM

## 2023-08-30 ENCOUNTER — Encounter: Payer: Self-pay | Admitting: Family Medicine

## 2023-08-30 ENCOUNTER — Telehealth: Payer: BC Managed Care – PPO

## 2023-09-06 ENCOUNTER — Ambulatory Visit: Payer: BC Managed Care – PPO | Admitting: Student

## 2023-09-06 ENCOUNTER — Other Ambulatory Visit (HOSPITAL_COMMUNITY)
Admission: RE | Admit: 2023-09-06 | Discharge: 2023-09-06 | Disposition: A | Discharge status: Home / Self Care | Payer: BC Managed Care – PPO | Source: Ambulatory Visit | Attending: Internal Medicine | Admitting: Internal Medicine

## 2023-09-06 VITALS — BP 125/77 | HR 77 | Temp 97.7°F | Ht 66.0 in | Wt 272.3 lb

## 2023-09-06 DIAGNOSIS — F332 Major depressive disorder, recurrent severe without psychotic features: Secondary | ICD-10-CM

## 2023-09-06 DIAGNOSIS — N898 Other specified noninflammatory disorders of vagina: Secondary | ICD-10-CM | POA: Diagnosis not present

## 2023-09-06 DIAGNOSIS — F319 Bipolar disorder, unspecified: Secondary | ICD-10-CM

## 2023-09-06 DIAGNOSIS — B3731 Acute candidiasis of vulva and vagina: Secondary | ICD-10-CM | POA: Insufficient documentation

## 2023-09-06 DIAGNOSIS — E785 Hyperlipidemia, unspecified: Secondary | ICD-10-CM | POA: Diagnosis not present

## 2023-09-06 DIAGNOSIS — Z7985 Long-term (current) use of injectable non-insulin antidiabetic drugs: Secondary | ICD-10-CM | POA: Diagnosis not present

## 2023-09-06 DIAGNOSIS — E1169 Type 2 diabetes mellitus with other specified complication: Secondary | ICD-10-CM

## 2023-09-06 LAB — POCT URINALYSIS DIPSTICK
Bilirubin, UA: NEGATIVE
Blood, UA: POSITIVE
Glucose, UA: POSITIVE — AB
Ketones, UA: NEGATIVE
Leukocytes, UA: NEGATIVE
Nitrite, UA: NEGATIVE
Protein, UA: NEGATIVE
Spec Grav, UA: 1.015 (ref 1.010–1.025)
Urobilinogen, UA: 1 U/dL
pH, UA: 6 (ref 5.0–8.0)

## 2023-09-06 MED ORDER — FLUCONAZOLE 100 MG PO TABS: 100.0000 mg | ORAL_TABLET | Freq: Every day | ORAL | 0 refills | Status: AC

## 2023-09-06 MED ORDER — MOUNJARO 5 MG/0.5ML ~~LOC~~ SOAJ: 5.0000 mg | SUBCUTANEOUS | 3 refills | Status: DC

## 2023-09-06 NOTE — Patient Instructions (Addendum)
 I sent in a prescription for Diflucan  to treat your suspected yeast infection. Please take one and if symptoms do not improve after 3 days, take the second dose. If symptoms do not improve after that, please let us  know. I will also call you with the results of the swab today.  Please STOP taking Jardiance  until symptoms have resolved (focus on low carbohydrate diet)  I sent in a new prescription for the updated dose of Mounjaro .  I have sent a psychiatry referral if you end up needing it.

## 2023-09-06 NOTE — Progress Notes (Signed)
 CC: Vaginal itching/discharge  HPI:  [name] is a 48 y.o. female living with a history stated below and presents today for vaginal itching/discharge. Please see problem based assessment and plan for additional details.  Past Medical History:  Diagnosis Date   Acute bacterial bronchitis 11/05/2015   Anemia    Anxiety    Asthma    childhood asthma   Back pain    Boil    tendency to form boils   Depression    has been treated in past-no present meds   Family history of anesthesia complication 2008   son- age 68 - had lockjaw after surgery (T&A)   Gestational diabetes mellitus (GDM), antepartum 08/17/2016   Fasting on 2 hour was 107   Headache(784.0)    Hyperglycemia    Hypertriglyceridemia    Menorrhagia 10/29/2015   Mixed diabetic hyperlipidemia associated with type 2 diabetes mellitus (HCC) 05/23/2020   PCOS (polycystic ovarian syndrome)    Pneumonia    AS A CHILD   Prediabetes     Current Outpatient Medications on File Prior to Visit  Medication Sig Dispense Refill   amLODipine  (NORVASC ) 5 MG tablet Take 1 tablet (5 mg total) by mouth daily. 30 tablet 11   atorvastatin  (LIPITOR) 20 MG tablet TAKE 1 TABLET (20 MG TOTAL) BY MOUTH AT BEDTIME. 30 tablet 0   benzonatate  (TESSALON ) 100 MG capsule Take 2 capsules (200 mg total) by mouth 3 (three) times daily as needed for cough. 60 capsule 1   citalopram  (CELEXA ) 40 MG tablet TAKE 1 TABLET BY MOUTH EVERY DAY 90 tablet 1   empagliflozin  (JARDIANCE ) 10 MG TABS tablet Take 1 tablet (10 mg total) by mouth daily before breakfast. 30 tablet 11   imiquimod  (ALDARA ) 5 % cream Apply topically 3 (three) times a week. 12 each 0   nicotine  (NICODERM CQ  - DOSED IN MG/24 HOURS) 14 mg/24hr patch Place 1 patch (14 mg total) onto the skin daily. 42 patch 0   norethindrone-ethinyl estradiol-FE (LOESTRIN FE 1/20) 1-20 MG-MCG tablet Take 1 tablet by mouth daily. 28 tablet 6   PROAIR  HFA 108 (90 Base) MCG/ACT inhaler Inhale 2 puffs into the  lungs every 4 (four) hours as needed.     triamcinolone  ointment (KENALOG ) 0.5 % Apply 1 Application topically 2 (two) times daily. 30 g 3   valsartan  (DIOVAN ) 80 MG tablet Take 1 tablet (80 mg total) by mouth daily. 30 tablet 3   varenicline  (CHANTIX ) 1 MG tablet TAKE 1 TABLET BY MOUTH EVERY DAY 90 tablet 1   No current facility-administered medications on file prior to visit.    Family History  Problem Relation Age of Onset   Diabetes Mother    Hypertension Mother    Hyperlipidemia Mother    Arthritis Mother    Anxiety disorder Mother    Depression Mother    Lung cancer Mother        smoked   Hypertension Father    Anxiety disorder Father    Hypertension Paternal Grandmother    Hyperlipidemia Paternal Grandmother    Anesthesia problems Son        son-had T&A-2008-had trismus    Social History   Socioeconomic History   Marital status: Single    Spouse name: Not on file   Number of children: Not on file   Years of education: Not on file   Highest education level: Not on file  Occupational History   Occupation: Customer Care Rep  Tobacco Use  Smoking status: Every Day    Current packs/day: 0.10    Average packs/day: 0.1 packs/day for 28.0 years (2.8 ttl pk-yrs)    Types: Cigarettes    Passive exposure: Past   Smokeless tobacco: Never   Tobacco comments:    1 pk per week  Vaping Use   Vaping status: Never Used  Substance and Sexual Activity   Alcohol use: No    Comment: occasional    Drug use: No   Sexual activity: Yes    Birth control/protection: None  Other Topics Concern   Not on file  Social History Narrative   Not on file   Social Drivers of Health   Financial Resource Strain: Not on file  Food Insecurity: No Food Insecurity (02/09/2023)   Hunger Vital Sign    Worried About Running Out of Food in the Last Year: Never true    Ran Out of Food in the Last Year: Never true  Transportation Needs: No Transportation Needs (02/09/2023)   PRAPARE -  Administrator, Civil Service (Medical): No    Lack of Transportation (Non-Medical): No  Physical Activity: Not on file  Stress: Not on file  Social Connections: Socially Isolated (02/09/2023)   Social Connection and Isolation Panel [NHANES]    Frequency of Communication with Friends and Family: More than three times a week    Frequency of Social Gatherings with Friends and Family: More than three times a week    Attends Religious Services: Never    Database Administrator or Organizations: No    Attends Banker Meetings: Never    Marital Status: Divorced  Catering Manager Violence: Not At Risk (02/09/2023)   Humiliation, Afraid, Rape, and Kick questionnaire    Fear of Current or Ex-Partner: No    Emotionally Abused: No    Physically Abused: No    Sexually Abused: No    Review of Systems: ROS negative except for what is noted on the assessment and plan.  Vitals:   09/06/23 1523  BP: 125/77  Pulse: 77  Temp: 97.7 F (36.5 C)  TempSrc: Oral  SpO2: 100%  Weight: 272 lb 4.8 oz (123.5 kg)  Height: 5' 6 (1.676 m)    Physical Exam: Constitutional: well-appearing, sitting in chair, in no acute distress Cardiovascular: regular rate and rhythm, no m/r/g, no LEE Pulmonary/Chest: normal work of breathing on room air, lungs clear to auscultation bilaterally Abdominal: soft, non-tender, non-distended MSK: normal bulk and tone Skin: warm and dry Psych: normal mood and behavior  Assessment & Plan:     Patient discussed with Dr. Trudy  Vaginal itching Patient endorses vaginal itching/white discharge which started a few days after beginning Jardiance  in 07/2023. She has tried topical treatment with Monistat which improved but did not completely resolve symptoms. UA shows glucosuria (Jardiance ) and Large blood. Self swab performed today to assess other potential causes. Has had one yeast infection in the past when pregnant which responded well to  Diflucan . -Diflucan  sent  -Patient instructed to hold Jardiance  pending resolution of symptoms.    Type 2 diabetes mellitus with hyperlipidemia (HCC) -Holding Jardiance  per above -Increased Mounjaro  to 5 mg dose  -UACR ordered -A1c at 1 month follow-up   Norman Lobstein, D.O. [facility] Internal [facility], PGY-1 Phone: [phone] Date 09/06/2023 Time 5:02 PM

## 2023-09-06 NOTE — Assessment & Plan Note (Addendum)
-  Holding Jardiance per above -Increased Mounjaro to 5 mg dose  -UACR ordered -A1c at 1 month follow-up

## 2023-09-06 NOTE — Assessment & Plan Note (Signed)
 Patient endorses vaginal itching/white discharge which started a few days after beginning Jardiance  in 07/2023. She has tried topical treatment with Monistat which improved but did not completely resolve symptoms. UA shows glucosuria (Jardiance ) and Large blood. Self swab performed today to assess other potential causes. Has had one yeast infection in the past when pregnant which responded well to Diflucan . -Diflucan  sent  -Patient instructed to hold Jardiance  pending resolution of symptoms.

## 2023-09-07 LAB — CERVICOVAGINAL ANCILLARY ONLY
Bacterial Vaginitis (gardnerella): NEGATIVE
Candida Glabrata: NEGATIVE
Candida Vaginitis: POSITIVE — AB
Chlamydia: NEGATIVE
Comment: NEGATIVE
Comment: NEGATIVE
Comment: NEGATIVE
Comment: NEGATIVE
Comment: NEGATIVE
Comment: NORMAL
Neisseria Gonorrhea: NEGATIVE
Trichomonas: NEGATIVE

## 2023-09-07 LAB — MICROALBUMIN / CREATININE URINE RATIO
Creatinine, Urine: 73.2 mg/dL
Microalb/Creat Ratio: 12 mg/g{creat} (ref 0–29)
Microalbumin, Urine: 8.5 ug/mL

## 2023-09-09 ENCOUNTER — Encounter: Payer: Self-pay | Admitting: Student

## 2023-09-09 NOTE — Progress Notes (Signed)
 Internal Medicine Clinic Attending  Case discussed with the resident at the time of the visit.  We reviewed the resident's history and exam and pertinent patient test results.  I agree with the assessment, diagnosis, and plan of care documented in the resident's note.  As this is first candidal infection, agree with rechallenging with empagliflozin  for diabetes after infection has been treated.

## 2023-09-14 ENCOUNTER — Other Ambulatory Visit: Payer: Self-pay | Admitting: Student

## 2023-09-14 DIAGNOSIS — F332 Major depressive disorder, recurrent severe without psychotic features: Secondary | ICD-10-CM

## 2023-10-09 ENCOUNTER — Ambulatory Visit (HOSPITAL_COMMUNITY): Payer: Medicaid Other | Admitting: Psychiatry

## 2023-10-09 ENCOUNTER — Encounter (HOSPITAL_COMMUNITY): Payer: Self-pay | Admitting: Psychiatry

## 2023-10-09 DIAGNOSIS — F3181 Bipolar II disorder: Secondary | ICD-10-CM

## 2023-10-09 MED ORDER — BUPROPION HCL ER (XL) 300 MG PO TB24: 300.0000 mg | ORAL_TABLET | ORAL | 2 refills | Status: DC

## 2023-10-09 MED ORDER — CITALOPRAM HYDROBROMIDE 20 MG PO TABS: 20.0000 mg | ORAL_TABLET | Freq: Every day | ORAL | 2 refills | Status: DC

## 2023-10-09 MED ORDER — TRAZODONE HCL 50 MG PO TABS: 50.0000 mg | ORAL_TABLET | Freq: Every day | ORAL | 2 refills | Status: DC

## 2023-10-09 MED ORDER — QUETIAPINE FUMARATE 25 MG PO TABS: 25.0000 mg | ORAL_TABLET | Freq: Every day | ORAL | 2 refills | Status: DC
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
## 2023-10-13 ENCOUNTER — Ambulatory Visit: Payer: Medicaid Other | Admitting: Student

## 2023-10-13 VITALS — BP 153/103 | HR 82 | Temp 98.0°F | Ht 66.0 in | Wt 275.6 lb

## 2023-10-13 DIAGNOSIS — Z1231 Encounter for screening mammogram for malignant neoplasm of breast: Secondary | ICD-10-CM

## 2023-10-13 DIAGNOSIS — E1169 Type 2 diabetes mellitus with other specified complication: Secondary | ICD-10-CM

## 2023-10-13 DIAGNOSIS — I10 Essential (primary) hypertension: Secondary | ICD-10-CM | POA: Diagnosis not present

## 2023-10-13 DIAGNOSIS — E785 Hyperlipidemia, unspecified: Secondary | ICD-10-CM | POA: Diagnosis not present

## 2023-10-13 DIAGNOSIS — Z7985 Long-term (current) use of injectable non-insulin antidiabetic drugs: Secondary | ICD-10-CM

## 2023-10-13 LAB — GLUCOSE, CAPILLARY: Glucose-Capillary: 155 mg/dL — ABNORMAL HIGH (ref 70–99)

## 2023-10-13 LAB — POCT GLYCOSYLATED HEMOGLOBIN (HGB A1C): Hemoglobin A1C: 7.3 % — AB (ref 4.0–5.6)

## 2023-10-13 MED ORDER — AMLODIPINE BESYLATE-VALSARTAN 10-160 MG PO TABS: 1.0000 | ORAL_TABLET | Freq: Every day | ORAL | 6 refills | Status: DC

## 2023-10-13 MED ORDER — MOUNJARO 7.5 MG/0.5ML ~~LOC~~ SOAJ
7.5000 mg | SUBCUTANEOUS | 3 refills | Status: DC
Start: 2023-10-13 — End: 2024-01-25

## 2023-10-13 MED ORDER — AMLODIPINE BESYLATE-VALSARTAN 5-160 MG PO TABS: 1.0000 | ORAL_TABLET | Freq: Every day | ORAL | 5 refills | Status: DC

## 2023-10-13 NOTE — Patient Instructions (Addendum)
  Thank you, Ms.Glori Scarlett, for allowing [place] to provide your care today.  You are doing a great job with your blood sugar and I think it is a good idea to increase the Mounjaro to 7.5 mg weekly.  We will plan to recheck your A1c again in 3 months.  For your blood pressure we will start a combination pill of amlodipine and valsartan.  The valsartan dose will increase so if you have any lightheadedness especially when standing up from a seated position let us know but that should not cause much of a drop in her blood pressure beyond what it is already doing.  I have ordered the following labs for you:  Lab Orders         BMP8+Anion Gap         Glucose, capillary         POC Hbg A1C       I have ordered the following medication/changed the following medications:   Start the following medications: Meds ordered this encounter  Medications   tirzepatide (MOUNJARO) 7.5 MG/0.5ML Pen    Sig: Inject 7.5 mg into the skin once a week.    Dispense:  2 mL    Refill:  3   DISCONTD: amLODipine-valsartan (EXFORGE) 10-160 MG tablet    Sig: Take 1 tablet by mouth daily.    Dispense:  30 tablet    Refill:  6   amLODipine-valsartan (EXFORGE) 5-160 MG tablet    Sig: Take 1 tablet by mouth daily.    Dispense:  30 tablet    Refill:  5      Follow up: 3 months    Remember:     Should you have any questions or concerns please call the internal medicine clinic at [phone].     Rocky Morel, DO [facility]

## 2023-10-13 NOTE — Progress Notes (Unsigned)
CC: Routine Follow Up   HPI:  Teresa Perez is a 48 y.o. female with pertinent PMH of HTN, PCOS, T2DM, MDD, HLD, and class IV obesity presenting for follow-up visit after being seen 09/06/2023 for Candida vaginitis infection. Please see assessment and plan below for further details.  Past Medical History:  Diagnosis Date   Acute bacterial bronchitis 11/05/2015   Anemia    Anxiety    Asthma    childhood asthma   Back pain    Boil    tendency to form boils   Depression    has been treated in past-no present meds   Family history of anesthesia complication 2008   son- age 46 - "had lockjaw" after surgery (T&A)   Gestational diabetes mellitus (GDM), antepartum 08/17/2016   Fasting on 2 hour was 107   Headache(784.0)    Hyperglycemia    Hypertriglyceridemia    Menorrhagia 10/29/2015   Mixed diabetic hyperlipidemia associated with type 2 diabetes mellitus (HCC) 05/23/2020   PCOS (polycystic ovarian syndrome)    Pneumonia    AS A CHILD   Prediabetes     Current Outpatient Medications  Medication Instructions   amLODipine-valsartan (EXFORGE) 5-160 MG tablet 1 tablet, Oral, Daily   atorvastatin (LIPITOR) 20 MG tablet TAKE 1 TABLET (20 MG TOTAL) BY MOUTH AT BEDTIME.   benzonatate (TESSALON) 200 mg, Oral, 3 times daily PRN   buPROPion (WELLBUTRIN XL) 300 mg, Oral, BH-each morning   citalopram (CELEXA) 20 mg, Oral, Daily   imiquimod (ALDARA) 5 % cream Topical, 3 times weekly   Mounjaro 7.5 mg, Subcutaneous, Weekly   nicotine (NICODERM CQ - DOSED IN MG/24 HOURS) 14 mg, Transdermal, Every 24 hours   norethindrone-ethinyl estradiol-FE (LOESTRIN FE 1/20) 1-20 MG-MCG tablet 1 tablet, Oral, Daily   PROAIR HFA 108 (90 Base) MCG/ACT inhaler 2 puffs, Inhalation, Every 4 hours PRN   QUEtiapine (SEROQUEL) 25 mg, Oral, Daily at bedtime   traZODone (DESYREL) 50 mg, Oral, Daily at bedtime   triamcinolone ointment (KENALOG) 0.5 % 1 Application, Topical, 2 times daily   varenicline (CHANTIX) 1 mg,  Oral, Daily     Review of Systems:   Pertinent items noted in HPI and/or A&P.  Physical Exam:  Vitals:   10/13/23 1418 10/13/23 1437  BP: (!) 181/98 (!) 153/103  Pulse: 79 82  Temp: 98 F (36.7 C)   TempSrc: Oral   SpO2: 100%   Weight: 275 lb 9.6 oz (125 kg)   Height: 5\' 6"  (1.676 m)     Constitutional: Well-appearing adult female. In no acute distress. HEENT: Normocephalic, atraumatic, Sclera non-icteric, PERRL, EOM intact Cardio:Regular rate and rhythm. 2+ bilateral radial pulses. Pulm:Clear to auscultation bilaterally. Normal work of breathing on room air. Abdomen: Soft, non-tender, non-distended, positive bowel sounds. WUJ:[id number] for extremity edema. Skin:Warm and dry. Neuro:Alert and oriented x3. No focal deficit noted. Psych:Pleasant mood and affect.   Assessment & Plan:   Hypertension Blood pressure today is high at 181/98 and 153/103 on recheck.  Patient is currently taking amlodipine 5 mg daily and valsartan 80 mg daily but did not take them this morning.  Previously she has had about control blood pressure on this regimen.  She is interested in a combination pill.  She is getting a blood pressure cuff to monitor her blood pressures. - Start amlodipine/valsartan 5/160 mg daily combination pill - BMP today to monitor renal function and electrolytes  Type 2 diabetes mellitus with hyperlipidemia (HCC) Last A1c was 9.6 and 07/02/2023 and  urine microalbumin was normal in 09/06/2023.  Due to recent Candida vaginitis infection after starting SGLT2 inhibitor she has not resumed this medication.  She is currently on tirzepatide 5 mg weekly which was increased about 5 weeks ago.  A1c today is down to 7.3.  Due to the benefits of weight loss and well-tolerated medication we will increase GLP-1. - Increase tirzepatide to 7.5 mg weekly - Return in about 3 months for repeat A1c    Patient discussed with Dr. Theodosia Paling, DO Internal Medicine Center Internal  Medicine Resident PGY-2 Clinic Phone: [phone] Pager: [phone]

## 2023-10-14 ENCOUNTER — Telehealth: Payer: Self-pay

## 2023-10-14 LAB — BMP8+ANION GAP
Anion Gap: 14 mmol/L (ref 10.0–18.0)
BUN/Creatinine Ratio: 9 (ref 9–23)
BUN: 7 mg/dL (ref 6–24)
CO2: 20 mmol/L (ref 20–29)
Calcium: 9.4 mg/dL (ref 8.7–10.2)
Chloride: 104 mmol/L (ref 96–106)
Creatinine, Ser: 0.81 mg/dL (ref 0.57–1.00)
Glucose: 120 mg/dL — ABNORMAL HIGH (ref 70–99)
Potassium: 4.5 mmol/L (ref 3.5–5.2)
Sodium: 138 mmol/L (ref 134–144)
eGFR: 89 mL/min/{1.73_m2} (ref >59)

## 2023-10-14 NOTE — Telephone Encounter (Signed)
Waylan Boga (Key: [id number]) PA Case ID #: [id number] Rx #: [id number] Need Help? Call us at [phone] Outcome Approved today by [facility] PA Case: [id number], Status: Approved, Coverage Starts on: 10/14/2023 12:00:00 AM, Coverage Ends on: 10/13/2024 12:00:00 AM. Authorization Expiration Date: 10/12/2024 Drug Greggory Keen 7.5MG /0.5ML auto-injectors ePA cloud logo Form [facility] [place] [employer] [place] Form [phone] NCPDP) Original Claim Info [id number]

## 2023-10-14 NOTE — Assessment & Plan Note (Signed)
Blood pressure today is high at 181/98 and 153/103 on recheck.  Patient is currently taking amlodipine 5 mg daily and valsartan 80 mg daily but did not take them this morning.  Previously she has had about control blood pressure on this regimen.  She is interested in a combination pill.  She is getting a blood pressure cuff to monitor her blood pressures. - Start amlodipine/valsartan 5/160 mg daily combination pill - BMP today to monitor renal function and electrolytes

## 2023-10-14 NOTE — Telephone Encounter (Signed)
Prior Authorization for patient (Mounjaro 7.5MG /0.5ML auto-injectors) came through on cover my meds was submitted with last office notes and labs awaiting approval or denial.  WUJ:[id number]

## 2023-10-14 NOTE — Assessment & Plan Note (Signed)
Last A1c was 9.6 and 07/02/2023 and urine microalbumin was normal in 09/06/2023.  Due to recent Candida vaginitis infection after starting SGLT2 inhibitor she has not resumed this medication.  She is currently on tirzepatide 5 mg weekly which was increased about 5 weeks ago.  A1c today is down to 7.3.  Due to the benefits of weight loss and well-tolerated medication we will increase GLP-1. - Increase tirzepatide to 7.5 mg weekly - Return in about 3 months for repeat A1c

## 2023-10-18 ENCOUNTER — Ambulatory Visit
Admission: RE | Admit: 2023-10-18 | Discharge: 2023-10-18 | Disposition: A | Discharge status: Home / Self Care | Payer: Medicaid Other | Source: Ambulatory Visit | Attending: Internal Medicine | Admitting: Internal Medicine

## 2023-10-18 DIAGNOSIS — Z1231 Encounter for screening mammogram for malignant neoplasm of breast: Secondary | ICD-10-CM

## 2023-10-18 NOTE — Progress Notes (Signed)
 Internal Medicine Clinic Attending  Case discussed with the resident at the time of the visit.  We reviewed the resident's history and exam and pertinent patient test results.  I agree with the assessment, diagnosis, and plan of care documented in the resident's note.

## 2023-10-25 ENCOUNTER — Other Ambulatory Visit (HOSPITAL_COMMUNITY): Payer: Self-pay | Admitting: Psychiatry

## 2023-10-25 MED ORDER — CITALOPRAM HYDROBROMIDE 20 MG PO TABS: 20.0000 mg | ORAL_TABLET | Freq: Every day | ORAL | 2 refills | Status: DC

## 2023-10-28 ENCOUNTER — Encounter: Payer: Self-pay | Admitting: Student

## 2023-11-04 ENCOUNTER — Telehealth (HOSPITAL_COMMUNITY): Payer: Self-pay

## 2023-11-04 NOTE — Telephone Encounter (Signed)
 Pa approved for quetipine fumarate 25 MG Tab from 11/01/23 to 10/31/24 approved j code [id number]

## 2023-11-10 ENCOUNTER — Telehealth (HOSPITAL_COMMUNITY): Payer: BC Managed Care – PPO | Admitting: Psychiatry

## 2024-01-25 ENCOUNTER — Other Ambulatory Visit: Payer: Self-pay | Admitting: Internal Medicine

## 2024-01-25 ENCOUNTER — Other Ambulatory Visit (HOSPITAL_COMMUNITY): Payer: Self-pay | Admitting: Psychiatry

## 2024-01-25 DIAGNOSIS — E1169 Type 2 diabetes mellitus with other specified complication: Secondary | ICD-10-CM

## 2024-01-25 MED ORDER — MOUNJARO 10 MG/0.5ML ~~LOC~~ SOAJ: 10.0000 mg | SUBCUTANEOUS | 1 refills | Status: DC

## 2024-01-25 MED ORDER — BUPROPION HCL ER (XL) 300 MG PO TB24: 300.0000 mg | ORAL_TABLET | ORAL | 2 refills | Status: DC

## 2024-01-25 MED ORDER — QUETIAPINE FUMARATE 25 MG PO TABS: 25.0000 mg | ORAL_TABLET | Freq: Every day | ORAL | 2 refills | Status: DC

## 2024-01-25 NOTE — Telephone Encounter (Signed)
 Copied from CRM [id number]. Topic: Clinical - Prescription Issue >> Jan 25, 2024  8:50 AM Teresa Perez F wrote: Patient would like to put in a request for an increase dosage of the listed prescription. Patient  is not experiencing any side effects and is responding to treatment well.  Callback Number: [id number]  Medication: tirzepatide (MOUNJARO) 7.5 MG/0.5ML Pen   This is the patient's preferred pharmacy:  [id number] - [place], [place] - [address] [place] [address] Phone: [phone] Fax: [phone]   Agent: Please be advised that Rx refills may take up to 3 business days. We ask that you follow-up with your pharmacy.

## 2024-01-27 ENCOUNTER — Other Ambulatory Visit: Payer: Self-pay | Admitting: Medical Genetics

## 2024-01-28 ENCOUNTER — Encounter (HOSPITAL_COMMUNITY): Payer: Self-pay | Admitting: Psychiatry

## 2024-01-28 ENCOUNTER — Telehealth (INDEPENDENT_AMBULATORY_CARE_PROVIDER_SITE_OTHER): Admitting: Psychiatry

## 2024-01-28 DIAGNOSIS — F3181 Bipolar II disorder: Secondary | ICD-10-CM

## 2024-01-28 MED ORDER — BUPROPION HCL ER (XL) 300 MG PO TB24: 300.0000 mg | ORAL_TABLET | ORAL | 2 refills | Status: DC

## 2024-01-28 MED ORDER — QUETIAPINE FUMARATE 25 MG PO TABS: 25.0000 mg | ORAL_TABLET | Freq: Every day | ORAL | 2 refills | Status: DC

## 2024-01-28 MED ORDER — CITALOPRAM HYDROBROMIDE 20 MG PO TABS: 20.0000 mg | ORAL_TABLET | Freq: Every day | ORAL | 2 refills | Status: DC
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
## 2024-02-07 ENCOUNTER — Other Ambulatory Visit (HOSPITAL_COMMUNITY): Payer: Self-pay | Admitting: Psychiatry

## 2024-02-07 MED ORDER — BUPROPION HCL ER (XL) 300 MG PO TB24: 300.0000 mg | ORAL_TABLET | ORAL | 2 refills | Status: AC

## 2024-02-07 MED ORDER — QUETIAPINE FUMARATE 25 MG PO TABS: 25.0000 mg | ORAL_TABLET | Freq: Every day | ORAL | 2 refills | Status: AC

## 2024-02-15 ENCOUNTER — Encounter: Payer: Self-pay | Admitting: *Deleted

## 2024-02-15 ENCOUNTER — Other Ambulatory Visit (HOSPITAL_COMMUNITY)
Admission: RE | Admit: 2024-02-15 | Discharge: 2024-02-15 | Disposition: A | Discharge status: Home / Self Care | Payer: Self-pay | Source: Ambulatory Visit | Attending: Medical Genetics | Admitting: Medical Genetics

## 2024-02-15 ENCOUNTER — Other Ambulatory Visit (HOSPITAL_COMMUNITY): Payer: Self-pay

## 2024-02-23 ENCOUNTER — Ambulatory Visit: Admitting: Student

## 2024-02-23 VITALS — BP 151/96 | HR 77 | Temp 97.7°F | Wt 282.2 lb

## 2024-02-23 DIAGNOSIS — Z6841 Body Mass Index (BMI) 40.0 and over, adult: Secondary | ICD-10-CM

## 2024-02-23 DIAGNOSIS — Z7985 Long-term (current) use of injectable non-insulin antidiabetic drugs: Secondary | ICD-10-CM | POA: Diagnosis not present

## 2024-02-23 DIAGNOSIS — D508 Other iron deficiency anemias: Secondary | ICD-10-CM | POA: Diagnosis not present

## 2024-02-23 DIAGNOSIS — E785 Hyperlipidemia, unspecified: Secondary | ICD-10-CM | POA: Diagnosis not present

## 2024-02-23 DIAGNOSIS — I10 Essential (primary) hypertension: Secondary | ICD-10-CM

## 2024-02-23 DIAGNOSIS — F32A Depression, unspecified: Secondary | ICD-10-CM

## 2024-02-23 DIAGNOSIS — Z1211 Encounter for screening for malignant neoplasm of colon: Secondary | ICD-10-CM

## 2024-02-23 DIAGNOSIS — Z0000 Encounter for general adult medical examination without abnormal findings: Secondary | ICD-10-CM

## 2024-02-23 DIAGNOSIS — E1169 Type 2 diabetes mellitus with other specified complication: Secondary | ICD-10-CM

## 2024-02-23 DIAGNOSIS — E282 Polycystic ovarian syndrome: Secondary | ICD-10-CM

## 2024-02-23 LAB — POCT GLYCOSYLATED HEMOGLOBIN (HGB A1C): Hemoglobin A1C: 6.4 % — AB (ref 4.0–5.6)

## 2024-02-23 LAB — GLUCOSE, CAPILLARY: Glucose-Capillary: 106 mg/dL — ABNORMAL HIGH (ref 70–99)

## 2024-02-23 MED ORDER — ATORVASTATIN CALCIUM 20 MG PO TABS
ORAL_TABLET | Freq: Every day | ORAL | 3 refills | Status: AC
Start: 2024-02-23 — End: ?

## 2024-02-23 NOTE — Progress Notes (Unsigned)
 CC: Routine visit  HPI:  [name] is a 48 y.o. female living with a history stated below and presents today for routine visit. Please see problem based assessment and plan for additional details.  Past Medical History:  Diagnosis Date   Acute bacterial bronchitis 11/05/2015   Anemia    Anxiety    Asthma    childhood asthma   Back pain    Boil    tendency to form boils   Depression    has been treated in past-no present meds   Family history of anesthesia complication 2008   son- age 73 - had lockjaw after surgery (T&A)   Gestational diabetes mellitus (GDM), antepartum 08/17/2016   Fasting on 2 hour was 107   Headache(784.0)    Healthcare maintenance 02/24/2024   Hyperglycemia    Hypertriglyceridemia    Menorrhagia 10/29/2015   Mixed diabetic hyperlipidemia associated with type 2 diabetes mellitus (HCC) 05/23/2020   PCOS (polycystic ovarian syndrome)    Pneumonia    AS A CHILD   Prediabetes     Current Outpatient Medications on File Prior to Visit  Medication Sig Dispense Refill   amLODipine -valsartan  (EXFORGE ) 5-160 MG tablet Take 1 tablet by mouth daily. 30 tablet 5   benzonatate  (TESSALON ) 100 MG capsule Take 2 capsules (200 mg total) by mouth 3 (three) times daily as needed for cough. 60 capsule 1   buPROPion  (WELLBUTRIN  XL) 300 MG 24 hr tablet Take 1 tablet (300 mg total) by mouth every morning. 90 tablet 2   citalopram  (CELEXA ) 20 MG tablet Take 1 tablet (20 mg total) by mouth daily. 90 tablet 2   imiquimod  (ALDARA ) 5 % cream Apply topically 3 (three) times a week. 12 each 0   nicotine  (NICODERM CQ  - DOSED IN MG/24 HOURS) 14 mg/24hr patch Place 1 patch (14 mg total) onto the skin daily. 42 patch 0   norethindrone-ethinyl estradiol-FE (LOESTRIN FE 1/20) 1-20 MG-MCG tablet Take 1 tablet by mouth daily. 28 tablet 6   PROAIR  HFA 108 (90 Base) MCG/ACT inhaler Inhale 2 puffs into the lungs every 4 (four) hours as needed.     QUEtiapine  (SEROQUEL ) 25 MG tablet Take 1  tablet (25 mg total) by mouth at bedtime. 90 tablet 2   tirzepatide  (MOUNJARO ) 10 MG/0.5ML Pen Inject 10 mg into the skin once a week. 2 mL 1   triamcinolone  ointment (KENALOG ) 0.5 % Apply 1 Application topically 2 (two) times daily. 30 g 3   varenicline  (CHANTIX ) 1 MG tablet TAKE 1 TABLET BY MOUTH EVERY DAY 90 tablet 1   No current facility-administered medications on file prior to visit.    Family History  Problem Relation Age of Onset   Diabetes Mother    Hypertension Mother    Hyperlipidemia Mother    Arthritis Mother    Anxiety disorder Mother    Depression Mother    Lung cancer Mother        smoked   Hypertension Father    Anxiety disorder Father    Hypertension Paternal Grandmother    Hyperlipidemia Paternal Grandmother    Anesthesia problems Son        son-had T&A-2008-had trismus    Social History   Socioeconomic History   Marital status: Single    Spouse name: Not on file   Number of children: Not on file   Years of education: Not on file   Highest education level: Not on file  Occupational History   Occupation: Customer Care  Rep  Tobacco Use   Smoking status: Every Day    Current packs/day: 0.10    Average packs/day: 0.1 packs/day for 28.0 years (2.8 ttl pk-yrs)    Types: Cigarettes    Passive exposure: Past   Smokeless tobacco: Never   Tobacco comments:    1 pk per week  Vaping Use   Vaping status: Never Used  Substance and Sexual Activity   Alcohol use: No    Comment: occasional    Drug use: No   Sexual activity: Yes    Birth control/protection: None  Other Topics Concern   Not on file  Social History Narrative   Not on file   Social Drivers of Health   Financial Resource Strain: Not on file  Food Insecurity: No Food Insecurity (02/09/2023)   Hunger Vital Sign    Worried About Running Out of Food in the Last Year: Never true    Ran Out of Food in the Last Year: Never true  Transportation Needs: No Transportation Needs (02/09/2023)   PRAPARE  - Administrator, Civil Service (Medical): No    Lack of Transportation (Non-Medical): No  Physical Activity: Not on file  Stress: Not on file  Social Connections: Socially Isolated (02/09/2023)   Social Connection and Isolation Panel    Frequency of Communication with Friends and Family: More than three times a week    Frequency of Social Gatherings with Friends and Family: More than three times a week    Attends Religious Services: Never    Database administrator or Organizations: No    Attends Banker Meetings: Never    Marital Status: Divorced  Catering manager Violence: Not At Risk (02/09/2023)   Humiliation, Afraid, Rape, and Kick questionnaire    Fear of Current or Ex-Partner: No    Emotionally Abused: No    Physically Abused: No    Sexually Abused: No    Review of Systems: ROS negative except for what is noted on the assessment and plan.  Vitals:   02/23/24 1337 02/23/24 1414  BP: (!) 156/97 (!) 151/96  Pulse: 73 77  Temp: 97.7 F (36.5 C)   TempSrc: Oral   SpO2: 98%   Weight: 282 lb 3.2 oz (128 kg)    Physical Exam: Constitutional: well-appearing female sitting in chair, in no acute distress Cardiovascular: regular rate and rhythm Pulmonary/Chest: normal work of breathing on room air, lungs clear to auscultation bilaterally Neurological: alert & oriented x 3  Assessment & Plan:   Hypertension Status: uncontrolled. BP today 151/96. Reports taking amlodipine -valsartan  5-160 mg (increased valsartan  80 to 160), but did Not take it today.  Last BMP 10/2023 with normal electrolytes and renal function    Plan -Continue: amlodipine -valsartan  but encouraged adherence  -2 week RN BP visit and discussed with patient to take BP med beforehand -BMP today  Type 2 diabetes mellitus with hyperlipidemia (HCC) Status: controlled. Last A1c of 7.3 in 10/2023.  A1c today is 6.4.  Currently taking Mounjaro  10 mg weekly. Denies n/v/d. No significant weight  loss. Completed foot exam.   Plan -Increase Mounjaro  to 12.5 mg weekly -A1c in 3 months -Ophthalmology exam: patient reported 09/2023  -Urine ACR completed 09/2023 -LDL 90 on 01/2023, refilled atorvastatin  20 mg, repeat lipid panel today -Referral to P.R.E.P at [facility]   Anemia Hx of IDA in setting of heavy menstrual periods. On Loestrin-FE. Hgb last 01/2023 was 10.4 (at baseline). Will repeat iron  studies and CBC today. Will  need to start either oral iron  supplementation or IV iron  infusion.   PCOS (polycystic ovarian syndrome) Follows with GYN at [facility]. However, patient requested transfer to office that is local/in [facility] for less commute and accommodating with her job. No acute concerns today. Will send referral to OBGYN for local [place] office.   Depression Follows with psychiatry in [place] via video visits. However, patient requested transfer to office local/in [place] to start having in-person visits. She prefers in-person visits and less commute with her job. Will send referral to psychiatry here in [place]. No acute concerns regarding this today. Remains on Wellbutrin  300 mg daily and Seroquel  25 mg at bedtime.   Healthcare maintenance -Agreed for referral to GI for screening colon  -Declined PCV today  -HCV screening completed 2017 by [facility]  09/27/2015 hepatitis C virus Ab, serum hepatitis C antibody NEGATIVE     Patient discussed with Dr. Winfrey  Mussa Groesbeck, D.O. [facility] Internal Medicine, PGY-2 Phone: [phone] Date 02/24/2024 Time 3:17 PM

## 2024-02-23 NOTE — Patient Instructions (Addendum)
 Thank you, Ms.Sawsan Okimoto for allowing us  to provide your care today. Today we discussed:  -A1c has improved to 6.4, doing well! Continue taking Mounjaro , can consider increasing dose if tolerated, let us  know if you are interested.  -Referral to GYN, Psychiatry, GI (screening colonoscopy) -Referral to exercise program [facility]) -Blood work today to check electrolytes, iron  levels, cholesterol -Refilled atorvastatin  for cholesterol  I have ordered the following medication/changed the following medications:  Start the following medications: Meds ordered this encounter  Medications   atorvastatin  (LIPITOR) 20 MG tablet    Sig: TAKE 1 TABLET (20 MG TOTAL) BY MOUTH AT BEDTIME.    Dispense:  90 tablet    Refill:  3     Follow up: 3 months   Should you have any questions or concerns please call the internal medicine clinic at [phone].    Shuaib Corsino, D.O. [facility] Center

## 2024-02-24 ENCOUNTER — Telehealth: Payer: Self-pay | Admitting: *Deleted

## 2024-02-24 ENCOUNTER — Ambulatory Visit: Payer: Self-pay | Admitting: Student

## 2024-02-24 ENCOUNTER — Encounter: Payer: Self-pay | Admitting: Student

## 2024-02-24 DIAGNOSIS — Z0000 Encounter for general adult medical examination without abnormal findings: Secondary | ICD-10-CM | POA: Insufficient documentation

## 2024-02-24 DIAGNOSIS — Z23 Encounter for immunization: Secondary | ICD-10-CM | POA: Insufficient documentation

## 2024-02-24 LAB — CBC
Hematocrit: 35.1 % (ref 34.0–46.6)
Hemoglobin: 10 g/dL — ABNORMAL LOW (ref 11.1–15.9)
MCH: 20.8 pg — ABNORMAL LOW (ref 26.6–33.0)
MCHC: 28.5 g/dL — ABNORMAL LOW (ref 31.5–35.7)
MCV: 73 fL — ABNORMAL LOW (ref 79–97)
Platelets: 448 10*3/uL (ref 150–450)
RBC: 4.8 x10E6/uL (ref 3.77–5.28)
RDW: 15.9 % — ABNORMAL HIGH (ref 11.7–15.4)
WBC: 10.5 10*3/uL (ref 3.4–10.8)

## 2024-02-24 LAB — BMP8+ANION GAP
Anion Gap: 15 mmol/L (ref 10.0–18.0)
BUN/Creatinine Ratio: 11 (ref 9–23)
BUN: 8 mg/dL (ref 6–24)
CO2: 19 mmol/L — ABNORMAL LOW (ref 20–29)
Calcium: 9.2 mg/dL (ref 8.7–10.2)
Chloride: 105 mmol/L (ref 96–106)
Creatinine, Ser: 0.73 mg/dL (ref 0.57–1.00)
Glucose: 98 mg/dL (ref 70–99)
Potassium: 4 mmol/L (ref 3.5–5.2)
Sodium: 139 mmol/L (ref 134–144)
eGFR: 101 mL/min/{1.73_m2} (ref >59)

## 2024-02-24 LAB — LIPID PANEL
Chol/HDL Ratio: 3.5 ratio (ref 0.0–4.4)
Cholesterol, Total: 163 mg/dL (ref 100–199)
HDL: 47 mg/dL (ref >39)
LDL Chol Calc (NIH): 95 mg/dL (ref 0–99)
Triglycerides: 115 mg/dL (ref 0–149)
VLDL Cholesterol Cal: 21 mg/dL (ref 5–40)

## 2024-02-24 LAB — IRON AND TIBC
Iron Saturation: 7 % — CL (ref 15–55)
Iron: 28 ug/dL (ref 27–159)
Total Iron Binding Capacity: 385 ug/dL (ref 250–450)
UIBC: 357 ug/dL (ref 131–425)

## 2024-02-24 LAB — FERRITIN: Ferritin: 15 ng/mL (ref 15–150)

## 2024-02-24 MED ORDER — MOUNJARO 12.5 MG/0.5ML ~~LOC~~ SOAJ: 12.5000 mg | SUBCUTANEOUS | 3 refills | Status: DC

## 2024-02-24 NOTE — Assessment & Plan Note (Addendum)
-  Agreed for referral to GI for screening colon  -Declined PCV today  -HCV screening completed 2017 by [facility]  09/27/2015 hepatitis C virus Ab, serum hepatitis C antibody NEGATIVE

## 2024-02-24 NOTE — Assessment & Plan Note (Addendum)
 Status: controlled. Last A1c of 7.3 in 10/2023.  A1c today is 6.4.  Currently taking Mounjaro  10 mg weekly. Denies n/v/d. No significant weight loss. Completed foot exam.   Plan -Increase Mounjaro  to 12.5 mg weekly -A1c in 3 months -Ophthalmology exam: patient reported 09/2023  -Urine ACR completed 09/2023 -LDL 90 on 01/2023, refilled atorvastatin  20 mg, repeat lipid panel today -Referral to P.R.E.P at [facility]

## 2024-02-24 NOTE — Assessment & Plan Note (Signed)
 Status: uncontrolled. BP today 151/96. Reports taking amlodipine -valsartan  5-160 mg (increased valsartan  80 to 160), but did Not take it today.  Last BMP 10/2023 with normal electrolytes and renal function    Plan -Continue: amlodipine -valsartan  but encouraged adherence  -2 week RN BP visit and discussed with patient to take BP med beforehand -BMP today

## 2024-02-24 NOTE — Assessment & Plan Note (Addendum)
 Hx of IDA in setting of heavy menstrual periods. On Loestrin-FE. Hgb last 01/2023 was 10.4 (at baseline). Will repeat iron  studies and CBC today. Will need to start either oral iron  supplementation or IV iron  infusion.

## 2024-02-24 NOTE — Assessment & Plan Note (Addendum)
 Follows with psychiatry in [place] via video visits. However, patient requested transfer to office local/in [place] to start having in-person visits. She prefers in-person visits and less commute with her job. Will send referral to psychiatry here in [place]. No acute concerns regarding this today. Remains on Wellbutrin  300 mg daily and Seroquel  25 mg at bedtime.

## 2024-02-24 NOTE — Telephone Encounter (Signed)
  New prescription was written and sent to Pharmacy.  Patient was called and informed of.  Message was left on Voice Mail.                                     Copied from CRM [id number]. Topic: Clinical - Prescription Issue >> Feb 23, 2024  4:32 PM Alfonso ORN wrote: Reason for CRM: patient checking on status of the tirzepatide  (MOUNJARO ) 10 MG/0.5ML Pen been sent to the pharmacy , regarding the 12.5  Patient call [phone]   ----------------------------------------------------------------------- From previous Reason for Contact - Medication Refill: Medication: tirzepatide  (MOUNJARO ) 10 MG/0.5ML Pen  Has the patient contacted their pharmacy?   (Agent: If no, request that the patient contact the pharmacy for the refill. If patient does not wish to contact the pharmacy document the reason why and proceed with request.) (Agent: If yes, when and what did the pharmacy advise?)  This is the patient's preferred pharmacy:  [id number] [place], [place] - [address] [place]  [address] Phone: [phone] Fax: [phone]  Is this the correct pharmacy for this prescription?   If no, delete pharmacy and type the correct one.   Has the prescription been filled recently?    Is the patient out of the medication?    Has the patient been seen for an appointment in the last year OR does the patient have an upcoming appointment?    Can we respond through MyChart?    Agent: Please be advised that Rx refills may take up to 3 business days. We ask that you follow-up with your pharmacy.

## 2024-02-24 NOTE — Assessment & Plan Note (Signed)
 Follows with GYN at [facility]. However, patient requested transfer to office that is local/in [facility] for less commute and accommodating with her job. No acute concerns today. Will send referral to OBGYN for local [place] office.

## 2024-02-25 ENCOUNTER — Other Ambulatory Visit (HOSPITAL_COMMUNITY): Payer: Self-pay | Admitting: Internal Medicine

## 2024-02-25 ENCOUNTER — Telehealth (HOSPITAL_COMMUNITY): Payer: Self-pay | Admitting: Internal Medicine

## 2024-02-25 DIAGNOSIS — D508 Other iron deficiency anemias: Secondary | ICD-10-CM

## 2024-02-25 LAB — GENECONNECT MOLECULAR SCREEN: Genetic Analysis Overall Interpretation: NEGATIVE

## 2024-02-25 NOTE — Telephone Encounter (Signed)
 Patient referred to infusion pharmacy team for ambulatory infusion of IV iron .  Insurance - [place] Medicaid  Site of care - Site of care: [facility] Dx code - D50.8  IV Iron  Therapy - Feraheme 510 mg IV x2  Infusion appointments - Scheduling team will schedule patient as soon as possible.   Jessyka Austria D. Eren Ryser, PharmD

## 2024-02-25 NOTE — Progress Notes (Signed)
 Internal Medicine Clinic Attending  Case discussed with the resident at the time of the visit.  We reviewed the resident's history and exam and pertinent patient test results.  I agree with the assessment, diagnosis, and plan of care documented in the resident's note.

## 2024-02-28 ENCOUNTER — Telehealth: Payer: Self-pay

## 2024-02-28 NOTE — Telephone Encounter (Signed)
 Started a new job so needs to wait to attend classes. I will call her back at the end of July to hopefully get her into a session.

## 2024-03-06 ENCOUNTER — Ambulatory Visit

## 2024-04-12 ENCOUNTER — Other Ambulatory Visit: Payer: Self-pay

## 2024-04-12 ENCOUNTER — Ambulatory Visit
Admission: RE | Admit: 2024-04-12 | Discharge: 2024-04-12 | Disposition: A | Discharge status: Home / Self Care | Attending: Physician Assistant | Admitting: Physician Assistant

## 2024-04-12 VITALS — BP 146/89 | HR 84 | Temp 98.3°F | Resp 19 | Ht 67.0 in | Wt 274.0 lb

## 2024-04-12 DIAGNOSIS — J4521 Mild intermittent asthma with (acute) exacerbation: Secondary | ICD-10-CM

## 2024-04-12 DIAGNOSIS — R0989 Other specified symptoms and signs involving the circulatory and respiratory systems: Secondary | ICD-10-CM | POA: Diagnosis not present

## 2024-04-12 LAB — POC COVID19/FLU A&B COMBO
Covid Antigen, POC: NEGATIVE
Influenza A Antigen, POC: NEGATIVE
Influenza B Antigen, POC: NEGATIVE

## 2024-04-12 MED ORDER — IPRATROPIUM-ALBUTEROL 0.5-2.5 (3) MG/3ML IN SOLN
3.0000 mL | Freq: Once | RESPIRATORY_TRACT | Status: AC
  Administered 2024-04-12 (×2): 3 mL via RESPIRATORY_TRACT

## 2024-04-12 MED ORDER — ALBUTEROL SULFATE HFA 108 (90 BASE) MCG/ACT IN AERS: 1.0000 | INHALATION_SPRAY | Freq: Four times a day (QID) | RESPIRATORY_TRACT | 0 refills | Status: DC | PRN

## 2024-04-12 NOTE — ED Triage Notes (Signed)
 Pt presents with complaints of non-productive cough, congestion, and headaches x 1 week. Pt states a coworker tested positive for COVID this past weekend. Currently denies pain. Head begins to hurt when coughing. OTC Dayquil and Ibuprofen  taken for symptoms with no improvement.

## 2024-04-12 NOTE — Discharge Instructions (Addendum)
 Based on your described symptoms and the duration of symptoms it is likely that you have a viral upper respiratory infection (often called a cold)  Symptoms can last for 3-10 days with lingering cough and intermittent symptoms potentially  lasting several  weeks after that.  The goal of treatment at this time is to reduce your symptoms and discomfort   You can use the following medications and measures to help yourself feel better until your body fights this off: DayQuil/NyQuil, TheraFlu, Alka-Seltzer  (these medications typically have the same active ingredients in them so you can choose whichever one you prefer and take consistently during the day and night according to the manufactures instructions.) Flonase  A daily antihistamine such as Zyrtec, Claritin, Allegra per your preference.  Please choose 1 and take consistently. Increased fluids.  It is recommended that you take in at least 64 ounces of water per day when you are not sick so it is important to increase this when you are sick and your body may be running fever. Rest Cough drops Chloraseptic throat spray to help with sore throat Nasal saline spray or nasal flushes to help with congestion and runny nose  At this time I suspect that your symptoms have likely caused an asthma exacerbation leading to the shortness of breath.  I am sending in a refill of your albuterol  rescue inhaler for you to use as needed up to every 6 hours.  Please make sure that you rinse your mouth out with water following administration of your rescue inhaler to help prevent thrush  If your symptoms seem like they are getting worse over the next 5 to 7 days or not improving you can always follow-up here in urgent care or go to your primary care provider for further management. Go to the ER if you begin to have more serious symptoms such as shortness of breath, trouble breathing, loss of consciousness, swelling around the eyes, high fever, severe lasting headaches,  vision changes or neck pain/stiffness.

## 2024-04-12 NOTE — ED Provider Notes (Signed)
 [facility]    CSN: [id number] Arrival date & time: 04/12/24  9062      History   Chief Complaint Chief Complaint  Patient presents with   Cough    Have been sick on and on for about a week now coughing and headache - Entered by patient    HPI Teresa Perez is a 48 y.o. female.  has a past medical history of Acute bacterial bronchitis (11/05/2015), Anemia, Anxiety, Asthma, Back pain, Boil, Depression, Family history of anesthesia complication (2008), Gestational diabetes mellitus (GDM), antepartum (08/17/2016), Headache(784.0), Healthcare maintenance (02/24/2024), Hyperglycemia, Hypertriglyceridemia, Menorrhagia (10/29/2015), Mixed diabetic hyperlipidemia associated with type 2 diabetes mellitus (HCC) (05/23/2020), PCOS (polycystic ovarian syndrome), Pneumonia, and Prediabetes.   HPI  Pt is here with concerns for coughing and nasal congestion for about a week She states her head has been hurting from the coughing  She states a coworker tested positive for COVID  Interventions: Dayquil and Ibuprofen - she took for one day and followed manufacturer's instructions   Patient reports a previous history of asthma.  She states that she does have an albuterol  inhaler but has not been using since her symptoms started.  Past Medical History:  Diagnosis Date   Acute bacterial bronchitis 11/05/2015   Anemia    Anxiety    Asthma    childhood asthma   Back pain    Boil    tendency to form boils   Depression    has been treated in past-no present meds   Family history of anesthesia complication 2008   son- age 38 - had lockjaw after surgery (T&A)   Gestational diabetes mellitus (GDM), antepartum 08/17/2016   Fasting on 2 hour was 107   Headache(784.0)    Healthcare maintenance 02/24/2024   Hyperglycemia    Hypertriglyceridemia    Menorrhagia 10/29/2015   Mixed diabetic hyperlipidemia associated with type 2 diabetes mellitus (HCC) 05/23/2020   PCOS (polycystic ovarian syndrome)     Pneumonia    AS A CHILD   Prediabetes     Patient Active Problem List   Diagnosis Date Noted   Healthcare maintenance 02/24/2024   Genital warts 07/02/2023   Weight gain 03/16/2023   Chronic cough 03/16/2023   Psoriasis 02/09/2023   Tobacco use 02/09/2023   Anxious mood as adjustment reaction 06/06/2020   Hypertension 05/23/2020   Hyperlipidemia 05/23/2020   GAD (generalized anxiety disorder) 05/23/2020   Adjustment reaction with anxiety and depression 07/29/2017   Type 2 diabetes mellitus with hyperlipidemia (HCC) 07/29/2017   History of conization of cervix affecting pregnancy, antepartum 08/05/2016   Vitamin D  deficiency 01/09/2014   Depression 01/02/2014   PCOS (polycystic ovarian syndrome) 01/02/2014   Anemia 06/24/2013   Severe obesity (BMI >= 40) (HCC) 06/22/2013    Past Surgical History:  Procedure Laterality Date   CERVICAL CONIZATION W/BX N/A 10/31/2015   Procedure: CONIZATION CERVIX WITH BIOPSY;  Surgeon: Shanda SHAUNNA Muscat, MD;  Location: [facility];  Service: Gynecology;  Laterality: N/A;   CESAREAN SECTION     x2 previous   CESAREAN SECTION  07/03/2011   Procedure: CESAREAN SECTION;  Surgeon: Nena DELENA App, MD;  Location: [facility];  Service: Gynecology;  Laterality: N/A;   CESAREAN SECTION N/A 06/22/2013   Procedure: CESAREAN SECTION, repeat;  Surgeon: Shanda SHAUNNA Muscat, MD;  Location: [facility];  Service: Obstetrics;  Laterality: N/A;   CESAREAN SECTION WITH BILATERAL TUBAL LIGATION Bilateral 02/14/2017   Procedure: CESAREAN SECTION WITH BILATERAL TUBAL LIGATION;  Surgeon: Anyanwu, Ugonna  A, MD;  Location: [facility];  Service: Obstetrics;  Laterality: Bilateral;   IUD REMOVAL N/A 10/31/2015   Procedure: INTRAUTERINE DEVICE (IUD) REMOVAL;  Surgeon: Shanda SHAUNNA Muscat, MD;  Location: [facility];  Service: Gynecology;  Laterality: N/A;   MOUTH SURGERY  2004    OB History     Gravida  6   Para  6   Term  6   Preterm  0   AB  0   Living  6      SAB  0    IAB  0   Ectopic  0   Multiple  0   Live Births  6            Home Medications    Prior to Admission medications   Medication Sig Start Date End Date Taking? Authorizing Provider  albuterol  (VENTOLIN  HFA) 108 (90 Base) MCG/ACT inhaler Inhale 1-2 puffs into the lungs every 6 (six) hours as needed for wheezing or shortness of breath. 04/12/24  Yes Carman Essick E, PA-C  amLODipine -valsartan  (EXFORGE ) 5-160 MG tablet Take 1 tablet by mouth daily. 10/13/23   Jolaine Pac, DO  atorvastatin  (LIPITOR) 20 MG tablet TAKE 1 TABLET (20 MG TOTAL) BY MOUTH AT BEDTIME. 02/23/24   Zheng, Michael, DO  benzonatate  (TESSALON ) 100 MG capsule Take 2 capsules (200 mg total) by mouth 3 (three) times daily as needed for cough. 11/18/22   Gladis Leonor HERO, MD  buPROPion  (WELLBUTRIN  XL) 300 MG 24 hr tablet Take 1 tablet (300 mg total) by mouth every morning. 02/07/24 02/06/25  Okey Barnie SAUNDERS, MD  citalopram  (CELEXA ) 20 MG tablet Take 1 tablet (20 mg total) by mouth daily. 01/28/24   Okey Barnie SAUNDERS, MD  imiquimod  (ALDARA ) 5 % cream Apply topically 3 (three) times a week. 07/02/23   Masters, Katie, DO  nicotine  (NICODERM CQ  - DOSED IN MG/24 HOURS) 14 mg/24hr patch Place 1 patch (14 mg total) onto the skin daily. 06/07/23 07/18/24  Masters, Izetta, DO  norethindrone-ethinyl estradiol-FE (LOESTRIN FE 1/20) 1-20 MG-MCG tablet Take 1 tablet by mouth daily. 02/09/23   Masters, Izetta, DO  PROAIR  HFA 108 (90 Base) MCG/ACT inhaler Inhale 2 puffs into the lungs every 4 (four) hours as needed.    [provider]  QUEtiapine  (SEROQUEL ) 25 MG tablet Take 1 tablet (25 mg total) by mouth at bedtime. 02/07/24 02/06/25  Okey Barnie SAUNDERS, MD  tirzepatide  (MOUNJARO ) 10 MG/0.5ML Pen Inject 10 mg into the skin once a week. 01/25/24   Masters, Izetta, DO  tirzepatide  (MOUNJARO ) 12.5 MG/0.5ML Pen Inject 12.5 mg into the skin once a week. 02/24/24   Zheng, Michael, DO  triamcinolone  ointment (KENALOG ) 0.5 % Apply 1 Application topically 2  (two) times daily. 07/02/23   Masters, Izetta, DO  varenicline  (CHANTIX ) 1 MG tablet TAKE 1 TABLET BY MOUTH EVERY DAY 12/10/22   Gladis Leonor HERO, MD    Family History Family History  Problem Relation Age of Onset   Diabetes Mother    Hypertension Mother    Hyperlipidemia Mother    Arthritis Mother    Anxiety disorder Mother    Depression Mother    Lung cancer Mother        smoked   Hypertension Father    Anxiety disorder Father    Hypertension Paternal Grandmother    Hyperlipidemia Paternal Grandmother    Anesthesia problems Son        son-had T&A-2008-had trismus    Social History Social History  Tobacco Use   Smoking status: Every Day    Current packs/day: 0.10    Average packs/day: 0.1 packs/day for 28.0 years (2.8 ttl pk-yrs)    Types: Cigarettes    Passive exposure: Past   Smokeless tobacco: Never   Tobacco comments:    1 pk per week  Vaping Use   Vaping status: Never Used  Substance Use Topics   Alcohol use: No    Comment: occasional    Drug use: No     Allergies   Latex and Lisinopril    Review of Systems Review of Systems  Constitutional:  Negative for chills and fever.  HENT:  Positive for congestion and sore throat. Negative for ear pain and postnasal drip.   Respiratory:  Positive for cough and shortness of breath. Negative for chest tightness and wheezing.   Gastrointestinal:  Negative for diarrhea, nausea and vomiting.  Musculoskeletal:  Positive for myalgias.  Neurological:  Positive for headaches. Negative for dizziness and light-headedness.     Physical Exam Triage Vital Signs ED Triage Vitals  Encounter Vitals Group     BP 04/12/24 1005 (!) 146/89     Girls Systolic BP Percentile --      Girls Diastolic BP Percentile --      Boys Systolic BP Percentile --      Boys Diastolic BP Percentile --      Pulse Rate 04/12/24 1005 84     Resp 04/12/24 1005 19     Temp 04/12/24 1005 98.3 F (36.8 C)     Temp Source 04/12/24 1005 Oral      SpO2 04/12/24 1005 98 %     Weight 04/12/24 1006 274 lb (124.3 kg)     Height 04/12/24 1006 5' 7 (1.702 m)     Head Circumference --      Peak Flow --      Pain Score 04/12/24 1005 0     Pain Loc --      Pain Education --      Exclude from Growth Chart --    No data found.  Updated Vital Signs BP (!) 146/89 (BP Location: Right Arm)   Pulse 84   Temp 98.3 F (36.8 C) (Oral)   Resp 19   Ht 5' 7 (1.702 m)   Wt 274 lb (124.3 kg)   LMP 03/13/2024 (Approximate)   SpO2 98%   BMI 42.91 kg/m   Visual Acuity Right Eye Distance:   Left Eye Distance:   Bilateral Distance:    Right Eye Near:   Left Eye Near:    Bilateral Near:     Physical Exam Vitals reviewed.  Constitutional:      General: She is awake.     Appearance: Normal appearance. She is well-developed and well-groomed.  HENT:     Head: Normocephalic and atraumatic.     Right Ear: Hearing, tympanic membrane and ear canal normal.     Left Ear: Hearing, tympanic membrane and ear canal normal.     Mouth/Throat:     Lips: Pink.     Mouth: Mucous membranes are moist.     Pharynx: Oropharynx is clear. Uvula midline. No pharyngeal swelling, oropharyngeal exudate, posterior oropharyngeal erythema, uvula swelling or postnasal drip.  Cardiovascular:     Rate and Rhythm: Normal rate and regular rhythm.     Pulses: Normal pulses.          Radial pulses are 2+ on the right side and 2+ on the left side.  Heart sounds: Normal heart sounds. No murmur heard.    No friction rub. No gallop.  Pulmonary:     Effort: Pulmonary effort is normal.     Breath sounds: Normal breath sounds. Decreased air movement present. No decreased breath sounds, wheezing, rhonchi or rales.     Comments: Air movement and breath sounds increased on reexamination following DuoNeb administration. Musculoskeletal:     Cervical back: Normal range of motion and neck supple.  Lymphadenopathy:     Head:     Right side of head: No submental, submandibular  or preauricular adenopathy.     Left side of head: No submental, submandibular or preauricular adenopathy.     Cervical:     Right cervical: No superficial cervical adenopathy.    Left cervical: No superficial cervical adenopathy.     Upper Body:     Right upper body: No supraclavicular adenopathy.     Left upper body: No supraclavicular adenopathy.  Skin:    General: Skin is warm and dry.  Neurological:     General: No focal deficit present.     Mental Status: She is alert and oriented to person, place, and time.  Psychiatric:        Mood and Affect: Mood normal.        Behavior: Behavior normal. Behavior is cooperative.      UC Treatments / Results  Labs (all labs ordered are listed, but only abnormal results are displayed) Labs Reviewed  POC COVID19/FLU A&B COMBO - Normal    EKG   Radiology No results found.  Procedures Procedures (including critical care time)  Medications Ordered in UC Medications  ipratropium-albuterol  (DUONEB) 0.5-2.5 (3) MG/3ML nebulizer solution 3 mL (3 mLs Nebulization Given 04/12/24 1057)    Initial Impression / Assessment and Plan / UC Course  I have reviewed the triage vital signs and the nursing notes.  Pertinent labs & imaging results that were available during my care of the patient were reviewed by me and considered in my medical decision making (see chart for details).      Final Clinical Impressions(s) / UC Diagnoses   Final diagnoses:  Symptoms of upper respiratory infection (URI)  Mild intermittent asthma with acute exacerbation   Patient presents today with concerns for nonproductive cough and nasal congestion that has been ongoing for about a week along with associated headache.  She reports a previous history of asthma but has not been using her inhaler for current symptoms because she does not currently have an active prescription for 1.  Rapid COVID and flu testing were negative.  Physical exam was notable for mildly  decreased air movement bilaterally which improved following DuoNeb breathing treatment administration in clinic.  At this time I suspect likely viral URI with asthma exacerbation.  Recommended patient takes OTC multisymptom relief medications consistently as well as utilizes albuterol  rescue inhaler.  New prescription sent into pharmacy on file to assist with symptoms.  ED and return precautions reviewed and provided in after visit summary.  Follow-up as needed.   Discharge Instructions      Based on your described symptoms and the duration of symptoms it is likely that you have a viral upper respiratory infection (often called a cold)  Symptoms can last for 3-10 days with lingering cough and intermittent symptoms potentially  lasting several  weeks after that.  The goal of treatment at this time is to reduce your symptoms and discomfort   You can use the following medications and  measures to help yourself feel better until your body fights this off: DayQuil/NyQuil, TheraFlu, Alka-Seltzer  (these medications typically have the same active ingredients in them so you can choose whichever one you prefer and take consistently during the day and night according to the manufactures instructions.) Flonase  A daily antihistamine such as Zyrtec, Claritin, Allegra per your preference.  Please choose 1 and take consistently. Increased fluids.  It is recommended that you take in at least 64 ounces of water per day when you are not sick so it is important to increase this when you are sick and your body may be running fever. Rest Cough drops Chloraseptic throat spray to help with sore throat Nasal saline spray or nasal flushes to help with congestion and runny nose  At this time I suspect that your symptoms have likely caused an asthma exacerbation leading to the shortness of breath.  I am sending in a refill of your albuterol  rescue inhaler for you to use as needed up to every 6 hours.  Please make sure  that you rinse your mouth out with water following administration of your rescue inhaler to help prevent thrush  If your symptoms seem like they are getting worse over the next 5 to 7 days or not improving you can always follow-up here in urgent care or go to your primary care provider for further management. Go to the ER if you begin to have more serious symptoms such as shortness of breath, trouble breathing, loss of consciousness, swelling around the eyes, high fever, severe lasting headaches, vision changes or neck pain/stiffness.       ED Prescriptions     Medication Sig Dispense Auth. Provider   albuterol  (VENTOLIN  HFA) 108 (90 Base) MCG/ACT inhaler Inhale 1-2 puffs into the lungs every 6 (six) hours as needed for wheezing or shortness of breath. 8 g Chick Cousins E, PA-C      PDMP not reviewed this encounter.   Marylene Rocky BRAVO, PA-C 04/12/24 8490

## 2024-04-25 ENCOUNTER — Encounter

## 2024-04-26 ENCOUNTER — Telehealth: Admitting: Physician Assistant

## 2024-04-26 DIAGNOSIS — Z20822 Contact with and (suspected) exposure to covid-19: Secondary | ICD-10-CM | POA: Diagnosis not present

## 2024-04-26 MED ORDER — FLUTICASONE PROPIONATE 50 MCG/ACT NA SUSP: 2.0000 | Freq: Every day | NASAL | 0 refills | Status: DC

## 2024-04-26 MED ORDER — BENZONATATE 100 MG PO CAPS
100.0000 mg | ORAL_CAPSULE | Freq: Three times a day (TID) | ORAL | 0 refills | Status: AC
Start: 2024-04-26 — End: 2024-05-01

## 2024-04-26 NOTE — Patient Instructions (Addendum)
 Umberto Ryder, thank you for joining Lynden GORMAN Snuffer, PA-C for today's virtual visit.  While this provider is not your primary care provider (PCP), if your PCP is located in our provider database this encounter information will be shared with them immediately following your visit.   A [facility] MyChart account gives you access to today's visit and all your visits, tests, and labs performed at [facility]  click here if you don't have a [facility] MyChart account or go to [email or web address]  Consent: (Patient) Davanee Klinkner provided verbal consent for this virtual visit at the beginning of the encounter.  Current Medications:  Current Outpatient Medications:    albuterol  (VENTOLIN  HFA) 108 (90 Base) MCG/ACT inhaler, Inhale 1-2 puffs into the lungs every 6 (six) hours as needed for wheezing or shortness of breath., Disp: 8 g, Rfl: 0   amLODipine -valsartan  (EXFORGE ) 5-160 MG tablet, Take 1 tablet by mouth daily., Disp: 30 tablet, Rfl: 5   atorvastatin  (LIPITOR) 20 MG tablet, TAKE 1 TABLET (20 MG TOTAL) BY MOUTH AT BEDTIME., Disp: 90 tablet, Rfl: 3   benzonatate  (TESSALON ) 100 MG capsule, Take 2 capsules (200 mg total) by mouth 3 (three) times daily as needed for cough., Disp: 60 capsule, Rfl: 1   buPROPion  (WELLBUTRIN  XL) 300 MG 24 hr tablet, Take 1 tablet (300 mg total) by mouth every morning., Disp: 90 tablet, Rfl: 2   citalopram  (CELEXA ) 20 MG tablet, Take 1 tablet (20 mg total) by mouth daily., Disp: 90 tablet, Rfl: 2   imiquimod  (ALDARA ) 5 % cream, Apply topically 3 (three) times a week., Disp: 12 each, Rfl: 0   nicotine  (NICODERM CQ  - DOSED IN MG/24 HOURS) 14 mg/24hr patch, Place 1 patch (14 mg total) onto the skin daily., Disp: 42 patch, Rfl: 0   norethindrone-ethinyl estradiol-FE (LOESTRIN FE 1/20) 1-20 MG-MCG tablet, Take 1 tablet by mouth daily., Disp: 28 tablet, Rfl: 6   PROAIR  HFA 108 (90 Base) MCG/ACT inhaler, Inhale 2 puffs into the lungs every 4 (four) hours as  needed., Disp: , Rfl:    QUEtiapine  (SEROQUEL ) 25 MG tablet, Take 1 tablet (25 mg total) by mouth at bedtime., Disp: 90 tablet, Rfl: 2   tirzepatide  (MOUNJARO ) 10 MG/0.5ML Pen, Inject 10 mg into the skin once a week., Disp: 2 mL, Rfl: 1   tirzepatide  (MOUNJARO ) 12.5 MG/0.5ML Pen, Inject 12.5 mg into the skin once a week., Disp: 6 mL, Rfl: 3   triamcinolone  ointment (KENALOG ) 0.5 %, Apply 1 Application topically 2 (two) times daily., Disp: 30 g, Rfl: 3   varenicline  (CHANTIX ) 1 MG tablet, TAKE 1 TABLET BY MOUTH EVERY DAY, Disp: 90 tablet, Rfl: 1   Medications ordered in this encounter:  No orders of the defined types were placed in this encounter.    *If you need refills on other medications prior to your next appointment, please contact your pharmacy*  Follow-Up: Call back or seek an in-person evaluation if the symptoms worsen or if the condition fails to improve as anticipated.  [facility] [phone]  Other Instructions  Take tessalon  and fluticasone  as directed  Follow up with your regular doctor in 1 week for reassessment and seek care sooner if your symptoms worsen or fail to improve.   If you have been instructed to have an in-person evaluation today at a local Urgent Care facility, please use the link below. It will take you to a list of all of our available  Urgent Cares, including address, phone  number and hours of operation. Please do not delay care.  [facility]  If you or a family member do not have a primary care provider, use the link below to schedule a visit and establish care. When you choose a [facility] primary care physician or advanced practice provider, you gain a long-term partner in health. Find a Primary Care Provider  Learn more about [facility]'s in-office and virtual care options: [facility] - Get Care Now

## 2024-04-26 NOTE — Progress Notes (Signed)
 Ms. Teresa, Perez are scheduled for a virtual visit with your provider today.    Just as we do with appointments in the office, we must obtain your consent to participate.  Your consent will be active for this visit and any virtual visit you may have with one of our providers in the next 365 days.    If you have a MyChart account, I can also send a copy of this consent to you electronically.  All virtual visits are billed to your insurance company just like a traditional visit in the office.  As this is a virtual visit, video technology does not allow for your provider to perform a traditional examination.  This may limit your provider's ability to fully assess your condition.  If your provider identifies any concerns that need to be evaluated in person or the need to arrange testing such as labs, EKG, etc, we will make arrangements to do so.    Although advances in technology are sophisticated, we cannot ensure that it will always work on either your end or our end.  If the connection with a video visit is poor, we may have to switch to a telephone visit.  With either a video or telephone visit, we are not always able to ensure that we have a secure connection.   I need to obtain your verbal consent now.   Are you willing to proceed with your visit today?   Teresa Perez has provided verbal consent on 04/26/2024 for a virtual visit (video or telephone).   Lynden GORMAN Snuffer, PA-C 04/26/2024  12:12 PM   Date:  04/26/2024   ID:  Teresa Perez, Teresa Perez 28-Aug-1976, MRN [id number]  Patient Location: Home Provider Location: Home Office   Participants: Patient and Provider for Visit and Wrap up  Method of visit: Video  Location of Patient: Home Location of Provider: Home Office Consent was obtain for visit over the video. Services rendered by provider: Visit was performed via video  A video enabled telemedicine application was used and I verified that I am speaking with the correct person using two  identifiers.  PCP:  Waymond Cart, MD   Chief Complaint:  Teresa Perez  History of Present Illness:    Teresa Perez is a 48 y.o. female with history as stated below. Presents video telehealth for an acute care visit  Pt states she started to have uri sxs about 1 week ago. She reports she took a covid test and it was positive. She reports headache (improving), fevers, chills, congestion.   Denies cough.   Past Medical, Surgical, Social History, Allergies, and Medications have been Reviewed.  Past Medical History:  Diagnosis Date   Acute bacterial bronchitis 11/05/2015   Anemia    Anxiety    Asthma    childhood asthma   Back pain    Boil    tendency to form boils   Depression    has been treated in past-no present meds   Family history of anesthesia complication 2008   son- age 72 - had lockjaw after surgery (T&A)   Gestational diabetes mellitus (GDM), antepartum 08/17/2016   Fasting on 2 hour was 107   Headache(784.0)    Healthcare maintenance 02/24/2024   Hyperglycemia    Hypertriglyceridemia    Menorrhagia 10/29/2015   Mixed diabetic hyperlipidemia associated with type 2 diabetes mellitus (HCC) 05/23/2020   PCOS (polycystic ovarian syndrome)    Pneumonia    AS A CHILD   Prediabetes     Current Meds  Medication Sig   benzonatate  (TESSALON ) 100 MG capsule Take 1 capsule (100 mg total) by mouth every 8 (eight) hours for 5 days.   fluticasone  (FLONASE ) 50 MCG/ACT nasal spray Place 2 sprays into both nostrils daily.     Allergies:   Latex and Lisinopril    ROS See HPI for history of present illness.  Physical Exam Constitutional:      Appearance: Normal appearance. She is not ill-appearing.  Neurological:     Mental Status: She is alert.               MDM: Pt tested positive for covid. She is outside the window for paxlovid. R for symptomatic management given    Tests Ordered: No orders of the defined types were placed in this encounter.   Medication Changes: Meds  ordered this encounter  Medications   benzonatate  (TESSALON ) 100 MG capsule    Sig: Take 1 capsule (100 mg total) by mouth every 8 (eight) hours for 5 days.    Dispense:  15 capsule    Refill:  0   fluticasone  (FLONASE ) 50 MCG/ACT nasal spray    Sig: Place 2 sprays into both nostrils daily.    Dispense:  16 g    Refill:  0     Disposition:  Follow up  Signed, Toneshia Coello GORMAN Snuffer, PA-C  04/26/2024 12:12 PM

## 2024-05-02 ENCOUNTER — Encounter: Payer: Self-pay | Admitting: Internal Medicine

## 2024-05-04 ENCOUNTER — Encounter: Payer: Self-pay | Admitting: Physician Assistant

## 2024-05-04 ENCOUNTER — Telehealth: Payer: Self-pay | Admitting: *Deleted

## 2024-05-04 NOTE — Telephone Encounter (Signed)
 RTC to patient have called the [facility].  To get in touch with C. Couture.  Have message her x 2 to call patient about the letter.  RTC to patient informed her that message have been sent to the provider with a message for her to give her a call if she can do the letter.  Clinics are unable to redo a letter as patient was not seen in the Clinics for the visit.  Patient voiced understanding of and will await a call from C. Couture or someone from her office.

## 2024-05-04 NOTE — Telephone Encounter (Signed)
 RTC to patient to ask about need for letter and what is needed on the letter. Copied from CRM [id number]. Topic: General - Other >> May 04, 2024  8:29 AM Diannia H wrote: Reason for CRM: Patient is needing a different letter for her job to return to work. She is needing the dates included in the letter. She needs it from August 22nd until September 2nd. Could you assist? Patient said could you send the letter via mychart? Patients callback number is [phone] could you call her for pickup if you can't.

## 2024-05-15 ENCOUNTER — Telehealth: Payer: Self-pay

## 2024-05-15 ENCOUNTER — Other Ambulatory Visit (HOSPITAL_COMMUNITY): Payer: Self-pay

## 2024-05-15 NOTE — Telephone Encounter (Signed)
 Teresa Perez (Key: [id number]) Mounjaro  12.5MG /0.5ML auto-injectors Form [employer] Electronic PA Form (2017 NCPDP) Created 29 minutes ago Sent to Plan 28 minutes ago Plan Response 28 minutes ago Submit Clinical Questions 26 minutes ago Determination Favorable 15 minutes ago Message from [employer] Your PA request has been approved. Additional information will be provided in the approval communication. (Message 1145). Authorization Expiration Date: May 16, 2027.

## 2024-05-15 NOTE — Telephone Encounter (Signed)
 Prior Authorization for patient (Mounjaro  12.5MG /0.5ML auto-injectors) came through on cover my meds was submitted with last office notes and labs awaiting approval or denial.  KEY:[id number]

## 2024-05-22 ENCOUNTER — Encounter: Payer: Self-pay | Admitting: Gastroenterology

## 2024-05-22 ENCOUNTER — Other Ambulatory Visit: Payer: Self-pay | Admitting: Student

## 2024-05-22 DIAGNOSIS — I10 Essential (primary) hypertension: Secondary | ICD-10-CM

## 2024-05-22 NOTE — Telephone Encounter (Signed)
 Medication sent to pharmacy

## 2024-05-23 ENCOUNTER — Telehealth: Payer: Self-pay

## 2024-05-23 NOTE — Telephone Encounter (Signed)
 Returned Teresa Perez's call from 9/22 about upcoming PREP Classes and asked her to return my call.

## 2024-05-29 ENCOUNTER — Telehealth: Payer: Self-pay

## 2024-05-29 ENCOUNTER — Ambulatory Visit: Payer: Self-pay

## 2024-05-29 NOTE — Telephone Encounter (Signed)
 Patient was contacted this afternoon to cancel an appointment for today 05/29/24 at 3:45 pm steroid injection on nurse schedule.  New appointment date is 06/08/2024 at 3:30 pm with Dr. Kem.

## 2024-05-30 ENCOUNTER — Telehealth: Payer: Self-pay

## 2024-05-30 NOTE — Telephone Encounter (Signed)
 Teresa Perez will be joining the October 21st PREP class at the [facility] at 10:00. We will meet for her initial assessment on October 12th at 9:00.

## 2024-06-08 ENCOUNTER — Ambulatory Visit (INDEPENDENT_AMBULATORY_CARE_PROVIDER_SITE_OTHER)

## 2024-06-08 VITALS — BP 160/96 | HR 82 | Ht 67.0 in | Wt 277.7 lb

## 2024-06-08 DIAGNOSIS — Z833 Family history of diabetes mellitus: Secondary | ICD-10-CM

## 2024-06-08 DIAGNOSIS — Z23 Encounter for immunization: Secondary | ICD-10-CM | POA: Diagnosis not present

## 2024-06-08 DIAGNOSIS — G8929 Other chronic pain: Secondary | ICD-10-CM | POA: Insufficient documentation

## 2024-06-08 DIAGNOSIS — E1169 Type 2 diabetes mellitus with other specified complication: Secondary | ICD-10-CM

## 2024-06-08 DIAGNOSIS — M25561 Pain in right knee: Secondary | ICD-10-CM | POA: Diagnosis not present

## 2024-06-08 DIAGNOSIS — E785 Hyperlipidemia, unspecified: Secondary | ICD-10-CM | POA: Diagnosis not present

## 2024-06-08 DIAGNOSIS — Z79899 Other long term (current) drug therapy: Secondary | ICD-10-CM

## 2024-06-08 DIAGNOSIS — I10 Essential (primary) hypertension: Secondary | ICD-10-CM

## 2024-06-08 DIAGNOSIS — Z7985 Long-term (current) use of injectable non-insulin antidiabetic drugs: Secondary | ICD-10-CM

## 2024-06-08 DIAGNOSIS — Z8249 Family history of ischemic heart disease and other diseases of the circulatory system: Secondary | ICD-10-CM

## 2024-06-08 DIAGNOSIS — F17210 Nicotine dependence, cigarettes, uncomplicated: Secondary | ICD-10-CM

## 2024-06-08 DIAGNOSIS — Z83438 Family history of other disorder of lipoprotein metabolism and other lipidemia: Secondary | ICD-10-CM

## 2024-06-08 LAB — POCT GLYCOSYLATED HEMOGLOBIN (HGB A1C): HbA1c, POC (controlled diabetic range): 6.7 % (ref 0.0–7.0)

## 2024-06-08 LAB — GLUCOSE, CAPILLARY: Glucose-Capillary: 123 mg/dL — ABNORMAL HIGH (ref 70–99)

## 2024-06-08 MED ORDER — TIRZEPATIDE 15 MG/0.5ML ~~LOC~~ SOAJ: 15.0000 mg | SUBCUTANEOUS | 1 refills | Status: AC

## 2024-06-08 NOTE — Assessment & Plan Note (Signed)
 Patient fell she said over a year ago.  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  Patient states that she has received 1 steroid injection before and it helped.  This was over 6 months ago.  On physical exam no point tenderness over patella today.  No temperature difference or erythema noted and no open wounds so less likely concern for septic joint.  Could also be arthritic component although not seen on imaging last year.  Will proceed with joint injection  Plan: Will proceed with steroid joint injection.  See procedure note at the end of this visit

## 2024-06-08 NOTE — Progress Notes (Signed)
 Established Patient Office Visit  Subjective   Patient ID: Teresa Perez, female    DOB: July 16, 1976  Age: 48 y.o. MRN: [id number]  Chief Complaint  Patient presents with   Knee Pain    Requesting joint injection     HPI Teresa Perez with 48 year old female that presents with PMH of Tobacco use, Type 2 diabetes mellitus, hyperlipidemia, GAD that presents for knee injection and management of type 2 diabetes mellitus and hypertension. See problem based assessment for more details.    ROS See problem based assessment for more details    Objective:     BP (!) 160/96 (BP Location: Right Arm, Patient Position: Sitting, Cuff Size: Normal)   Pulse 82   Ht 5' 7 (1.702 m)   Wt 277 lb 11.2 oz (126 kg)   SpO2 98%   BMI 43.49 kg/m  BP Readings from Last 3 Encounters:  06/08/24 (!) 160/96  04/12/24 (!) 146/89  02/23/24 (!) 151/96   Wt Readings from Last 3 Encounters:  06/08/24 277 lb 11.2 oz (126 kg)  04/12/24 274 lb (124.3 kg)  02/23/24 282 lb 3.2 oz (128 kg)      Physical Exam Constitution: Alert, no acute distress, obese female  Heart: Regular rate and rhythm, no murmurs Lungs: Respiratory effort normal, clear to auscultation Right knee: Nonerythematous, no temperature difference between left and right knee.  No open wounds.  No crepitus felt with flexion extension of the knee bilaterally.  No tenderness to palpation over patella on the right knee.  Results for orders placed or performed in visit on 06/08/24  Glucose, capillary  Result Value Ref Range   Glucose-Capillary 123 (H) 70 - 99 mg/dL  POC Hbg J8R  Result Value Ref Range   Hemoglobin A1C     HbA1c POC (<> result, manual entry)     HbA1c, POC (prediabetic range)     HbA1c, POC (controlled diabetic range) 6.7 0.0 - 7.0 %    Last hemoglobin A1c Lab Results  Component Value Date   HGBA1C 6.7 06/08/2024      The 10-year ASCVD risk score (Arnett DK, et al., 2019) is: 33.3%    Assessment & Plan:   Problem  List Items Addressed This Visit     Type 2 diabetes mellitus with hyperlipidemia (HCC) - Primary   A1c is 6.7 today up from 6.4 previously.  Still controlled.  Patient has been on Mounjaro  12.5 mg.  She states that she has some GI side effects whenever she increases the dose but these go away and is interested in increasing dose today   Plan: Will increase Mounjaro  to 15 mg weekly at this visit to help with diabetes control and aid in weight loss Patient is also supposed to be starting prep program she said soon for help with exercise and weight loss Recheck A1c in 3 months       Relevant Medications   tirzepatide  (MOUNJARO ) 15 MG/0.5ML Pen   Other Relevant Orders   POC Hbg A1C (Completed)   Hypertension   Patient states that she only started retaking her blood pressure medication yesterday because she had some confusion about the dosage.  Did confirm with her that she is supposed be on amlodipine  5 valsartan  160 combination.  Patient is not checking blood pressure at home so did encourage taking daily blood pressures  Plan: Confirmed amlodipine  5-valsartan  160 mg dose in hopes to aid adherence Encouraged patient to check her blood pressure at home  Encounter for immunization   Patient received flu shot today       Relevant Orders   Flu vaccine trivalent PF, 6mos and older(Flulaval,Afluria,Fluarix,Fluzone) (Completed)   Chronic pain of right knee   Patient fell she said over a year ago.  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  Patient states that she has received 1 steroid injection before and it helped.  This was over 6 months ago.  On physical exam no point tenderness over patella today.  No temperature difference or erythema noted and no open wounds so less likely concern for septic joint.  Could also be arthritic component although not seen on imaging last year.  Will proceed with joint injection  Plan: Will proceed with steroid joint  injection.  See procedure note at the end of this visit       Return in about 3 months (around 09/08/2024) for blood pressure and type 2 dm .    Teresa Tindel D'Mello, DO Patient seen with Dr.Chambliss   Procedure Note   Indication:  Right knee pain, likely arthritis     Operators: Drs. Perez/Chambliss    The patient was provided with risks, benefits, and alternatives to intraarticular injection. She consented to intraarticular knee injection for right knee pain and arthritis.  After a time out was preformed, the knee was prepped in a sterile fashion. Cold spray was applied to the skin over the insertion site (lateral inferior patellar aspect of the knee while in the seated position). A mixture of 1 cc 1% lidocaine  and 40 mg of Kenalog  was injected into the knee using a 25 gauge 1 and 1/2 inch needle.  The right knee space was entered without difficulty, entered successfully after 1 attempts. The patient tolerated the procedures well without complication.

## 2024-06-08 NOTE — Assessment & Plan Note (Signed)
 Patient states that she only started retaking her blood pressure medication yesterday because she had some confusion about the dosage.  Did confirm with her that she is supposed be on amlodipine  5 valsartan  160 combination.  Patient is not checking blood pressure at home so did encourage taking daily blood pressures  Plan: Confirmed amlodipine  5-valsartan  160 mg dose in hopes to aid adherence Encouraged patient to check her blood pressure at home

## 2024-06-08 NOTE — Patient Instructions (Signed)
 Today we discussed the following medical conditions and plan:   For your diabetes we will increase your mounjaro  to 15 mg weekly. We will check your A1c in another 3 months.   For your knee pain we gave you a steroid injection today.   For your blood pressure continue taking the amlodipine - valsartan  5-160 mg daily and monitor your blood pressure at home   We look forward to seeing you next time. Please call our clinic at [phone] if you have any questions or concerns. The best time to call is Monday-Friday from 9am-4pm, but there is someone available 24/7. If you need medication refills, please notify your pharmacy one week in advance and they will send us  a request.   Thank you for trusting me with your care. Wishing you the best!   Prynce Jacober D'Mello, DO  [facility]

## 2024-06-08 NOTE — Assessment & Plan Note (Signed)
 Patient received flu shot today

## 2024-06-08 NOTE — Assessment & Plan Note (Signed)
 A1c is 6.7 today up from 6.4 previously.  Still controlled.  Patient has been on Mounjaro  12.5 mg.  She states that she has some GI side effects whenever she increases the dose but these go away and is interested in increasing dose today   Plan: Will increase Mounjaro  to 15 mg weekly at this visit to help with diabetes control and aid in weight loss Patient is also supposed to be starting prep program she said soon for help with exercise and weight loss Recheck A1c in 3 months

## 2024-06-12 NOTE — Progress Notes (Signed)
 Internal Medicine Clinic Attending  I was physically present during the key portions of the resident provided service and participated in the medical decision making of patient's management care. I reviewed pertinent patient test results.  The assessment, diagnosis, and plan were formulated together and I agree with the documentation in the resident's note.  Jeanelle Layman CROME, MD

## 2024-06-13 NOTE — Progress Notes (Signed)
 [facility] PREP Evaluation  Patient Details  Name: Teresa Perez MRN: [id number] Date of Birth: July 20, 1976 Age: 48 y.o. PCP: Waymond Cart, MD  Vitals:   06/13/24 0937  BP: (!) 166/109  Pulse: 78  SpO2: 98%  Weight: 273 lb 9.6 oz (124.1 kg)     [facility] Eval - 06/13/24 0900       [facility] PREP Location   [facility] PREP Location [facility]      Referral    Referring Provider Zheng    Reason for referral Diabetes;Hypertension;Inactivity;Obesitity/Overweight    Program Start Date 06/20/24      Measurement   Waist Circumference 49 inches    Hip Circumference 54.5 inches    Body fat 42.2 percent      Information for Trainer   Goals --   Be able to climb stairs, get a routine, lose weight   Current Exercise None    Orthopedic Concerns Right knee, lower back    Current Barriers None      Mobility and Daily Activities   I find it easy to walk up or down two or more flights of stairs. 2    I have no trouble taking out the trash. 4    I do housework such as vacuuming and dusting on my own without difficulty. 4    I can easily lift a gallon of milk (8lbs). 4    I can easily walk a mile. 3    I have no trouble reaching into high cupboards or reaching down to pick up something from the floor. 2    I do not have trouble doing out-door work such as Loss adjuster, chartered, raking leaves, or gardening. 2      Mobility and Daily Activities   I feel younger than my age. 2    I feel independent. 4    I feel energetic. 2    I live an active life.  2    I feel strong. 2    I feel healthy. 2    I feel active as other people my age. 1      How fit and strong are you.   Fit and Strong Total Score 36         Past Medical History:  Diagnosis Date   Acute bacterial bronchitis 11/05/2015   Anemia    Anxiety    Asthma    childhood asthma   Back pain    Boil    tendency to form boils   Depression    has been treated in past-no present meds   Family history of anesthesia complication 2008   son-  age 58 - had lockjaw after surgery (T&A)   Gestational diabetes mellitus (GDM), antepartum 08/17/2016   Fasting on 2 hour was 107   Headache(784.0)    Healthcare maintenance 02/24/2024   Hyperglycemia    Hypertriglyceridemia    Menorrhagia 10/29/2015   Mixed diabetic hyperlipidemia associated with type 2 diabetes mellitus (HCC) 05/23/2020   PCOS (polycystic ovarian syndrome)    Pneumonia    AS A CHILD   Prediabetes    Past Surgical History:  Procedure Laterality Date   CERVICAL CONIZATION W/BX N/A 10/31/2015   Procedure: CONIZATION CERVIX WITH BIOPSY;  Surgeon: Shanda SHAUNNA Muscat, MD;  Location: [facility];  Service: Gynecology;  Laterality: N/A;   CESAREAN SECTION     x2 previous   CESAREAN SECTION  07/03/2011   Procedure: CESAREAN SECTION;  Surgeon: Nena DELENA App, MD;  Location:  [facility];  Service: Gynecology;  Laterality: N/A;   CESAREAN SECTION N/A 06/22/2013   Procedure: CESAREAN SECTION, repeat;  Surgeon: Shanda SHAUNNA Muscat, MD;  Location: [facility];  Service: Obstetrics;  Laterality: N/A;   CESAREAN SECTION WITH BILATERAL TUBAL LIGATION Bilateral 02/14/2017   Procedure: CESAREAN SECTION WITH BILATERAL TUBAL LIGATION;  Surgeon: Herchel Gloris LABOR, MD;  Location: [facility];  Service: Obstetrics;  Laterality: Bilateral;   IUD REMOVAL N/A 10/31/2015   Procedure: INTRAUTERINE DEVICE (IUD) REMOVAL;  Surgeon: Shanda SHAUNNA Muscat, MD;  Location: [facility];  Service: Gynecology;  Laterality: N/A;   MOUTH SURGERY  2004   Social History   Tobacco Use  Smoking Status Every Day   Current packs/day: 0.10   Average packs/day: 0.1 packs/day for 28.0 years (2.8 ttl pk-yrs)   Types: Cigarettes   Passive exposure: Past  Smokeless Tobacco Never  Tobacco Comments   1 pk per week   Teresa Perez is ready to start the PREP program on October 21 at 10:00 at the [facility].  Teresa Perez 06/13/2024, 9:41 AM

## 2024-06-20 NOTE — Progress Notes (Signed)
 [facility] PREP Weekly Session  Patient Details  Name: Teresa Perez MRN: [id number] Date of Birth: 1975/09/03 Age: 48 y.o. PCP: Waymond Cart, MD  Vitals:   06/20/24 1417  Weight: 267 lb (121.1 kg)     [facility] Weekly seesion - 06/20/24 1400       [facility] PREP Location   [facility] PREP Location [facility]      Weekly Session   Topic Discussed Goal setting and welcome to the program   Intro to class/ went over book/ and gave a tour   Classes attended to date 1          Teresa Perez 06/20/2024, 2:19 PM

## 2024-06-21 ENCOUNTER — Ambulatory Visit

## 2024-06-21 VITALS — Ht 66.0 in | Wt 269.0 lb

## 2024-06-21 DIAGNOSIS — Z1211 Encounter for screening for malignant neoplasm of colon: Secondary | ICD-10-CM

## 2024-06-21 MED ORDER — NA SULFATE-K SULFATE-MG SULF 17.5-3.13-1.6 GM/177ML PO SOLN: 1.0000 | Freq: Once | ORAL | 0 refills | Status: AC

## 2024-06-21 NOTE — Progress Notes (Signed)

## 2024-07-04 ENCOUNTER — Encounter: Admitting: Gastroenterology

## 2024-07-13 NOTE — Progress Notes (Signed)
 Teia withdrew from PREP class on 07/13/2024 because of job hour conflicts.

## 2024-07-19 DIAGNOSIS — F4312 Post-traumatic stress disorder, chronic: Secondary | ICD-10-CM | POA: Diagnosis not present

## 2024-07-19 DIAGNOSIS — F411 Generalized anxiety disorder: Secondary | ICD-10-CM | POA: Diagnosis not present

## 2024-07-19 DIAGNOSIS — F3132 Bipolar disorder, current episode depressed, moderate: Secondary | ICD-10-CM | POA: Diagnosis not present

## 2024-07-21 ENCOUNTER — Other Ambulatory Visit (HOSPITAL_COMMUNITY)
Admission: RE | Admit: 2024-07-21 | Discharge: 2024-07-21 | Disposition: A | Discharge status: Home / Self Care | Source: Ambulatory Visit | Attending: Obstetrics and Gynecology | Admitting: Obstetrics and Gynecology

## 2024-07-21 ENCOUNTER — Other Ambulatory Visit: Payer: Self-pay

## 2024-07-21 ENCOUNTER — Ambulatory Visit: Admitting: Obstetrics and Gynecology

## 2024-07-21 VITALS — BP 138/89 | HR 76 | Wt 267.3 lb

## 2024-07-21 DIAGNOSIS — N939 Abnormal uterine and vaginal bleeding, unspecified: Secondary | ICD-10-CM | POA: Diagnosis not present

## 2024-07-21 DIAGNOSIS — Z113 Encounter for screening for infections with a predominantly sexual mode of transmission: Secondary | ICD-10-CM | POA: Diagnosis not present

## 2024-07-21 DIAGNOSIS — Z124 Encounter for screening for malignant neoplasm of cervix: Secondary | ICD-10-CM

## 2024-07-21 DIAGNOSIS — Z01419 Encounter for gynecological examination (general) (routine) without abnormal findings: Secondary | ICD-10-CM

## 2024-07-21 MED ORDER — TRANEXAMIC ACID 650 MG PO TABS: 1300.0000 mg | ORAL_TABLET | Freq: Three times a day (TID) | ORAL | 2 refills | Status: DC

## 2024-07-21 NOTE — Patient Instructions (Addendum)
 PROCEDURES FOR HEAVY BLEEDING  Uterine artery embolization  Endometrial ablation  Hysterectomy   MEDICATIONS - hormonal and non-hormonal including IUD

## 2024-07-21 NOTE — Progress Notes (Signed)
 ANNUAL EXAM Patient name: Teresa Perez MRN [id number]  Date of birth: March 07, 1976 Chief Complaint:   Gynecologic Exam  History of Present Illness:   Teresa Perez is a 48 y.o. [phone] being seen today for a routine annual exam.  Current complaints: HSV vs HPV - not sure, due for pap  Menstrual concerns? Yes  regular, 7 days, passing a lot of clots and has sharp pain when it happens, uses pads and changes every 2 days, interested in surgery to stop her periods. Previously irregular menses Breast or nipple changes? No  Contraception use? abstinence Sexually active? No   Has occasional hair bumps and gets vulva  Discussed the use of AI scribe software for clinical note transcription with the patient, who gave verbal consent to proceed.  History of Present Illness Teresa Perez is a 48 year old female who presents for a Pap smear and STD testing.  She has experienced heavy and irregular menstrual periods for at least a year, describing them as heavy with clots. Her periods have been consistently heavy every month, and she is currently spotting after not bleeding the previous day.  She has previously used Loestrin birth control, which she stopped two to three months after starting, as her periods remained unchanged. She also tried the ParaGard IUD, which did not alleviate her symptoms and may have worsened them. She has had the Mirena IUD in the past, which resulted in a pregnancy after removal.  She is not currently sexually active but wishes to undergo STD testing, including gonorrhea, chlamydia, trichomonas, hepatitis, syphilis, and HIV, as a precaution. She is concerned about hair bumps and warts, noting uncertainty if they are related to shaving or other factors.  She is currently on Mounjaro  for A1c management but reports no weight loss despite its use. She has previously attended a healthy weight clinic and participated in a program at the [facility] but is unable to attend classes due to her work  schedule. She wants to return to the healthy weight clinic if possible.  She has a history of five C-sections, with the last four being scheduled. She also mentions having had a cervical conization in 2017. No history of blood clots in the lungs or legs and no color blindness.    Patient's last menstrual period was 07/13/2024 (exact date).   The pregnancy intention screening data noted above was reviewed. Potential methods of contraception were discussed. The patient elected to proceed with No data recorded.   Last pap     Component Value Date/Time   DIAGPAP  04/23/2021 0839    - Negative for intraepithelial lesion or malignancy (NILM)   DIAGPAP  04/04/2020 0836    - Negative for intraepithelial lesion or malignancy (NILM)   DIAGPAP  09/22/2018 0000    NEGATIVE FOR INTRAEPITHELIAL LESIONS OR MALIGNANCY.   HPVHIGH Negative 04/23/2021 0839   HPVHIGH Negative 04/04/2020 0836   ADEQPAP  04/23/2021 0839    Satisfactory for evaluation; transformation zone component PRESENT.   ADEQPAP  04/04/2020 0836    Satisfactory for evaluation; transformation zone component ABSENT.   ADEQPAP  09/22/2018 0000    Satisfactory for evaluation  endocervical/transformation zone component ABSENT.     H/O abnormal pap: yes cone in 2017 Last mammogram: 2/02025 BIRADS 1.  Last colonoscopy: n/a.      07/21/2024    9:10 AM 10/09/2023    9:14 AM 09/06/2023    3:42 PM 03/16/2023    4:26 PM 02/09/2023   10:30 AM  Depression  screen PHQ 2/9  Decreased Interest 2  2 0 2  Down, Depressed, Hopeless 1  2 1 3   PHQ - 2 Score 3  4 1 5   Altered sleeping 1  2 2 2   Tired, decreased energy 3  2 2 3   Change in appetite 2  2 2 2   Feeling bad or failure about yourself  1  3 1 3   Trouble concentrating 1  2 1 2   Moving slowly or fidgety/restless 0  2 1 0  Suicidal thoughts 0  2 0 0  PHQ-9 Score 11  19  10  17    Difficult doing work/chores   Extremely dIfficult Somewhat difficult Extremely dIfficult     Information is  confidential and restricted. Go to Review Flowsheets to unlock data.   Data saved with a previous flowsheet row definition        07/21/2024    9:11 AM 09/06/2023    3:43 PM 03/16/2023    4:27 PM 02/09/2023   10:30 AM  GAD 7 : Generalized Anxiety Score  Nervous, Anxious, on Edge 1 3 1 2   Control/stop worrying 3 3 3 3   Worry too much - different things 3 2 2 3   Trouble relaxing 2 2 2 3   Restless 1 2 2 2   Easily annoyed or irritable 2 2 1 2   Afraid - awful might happen 1 2 1 2   Total GAD 7 Score 13 16 12 17   Anxiety Difficulty  Extremely difficult Somewhat difficult Extremely difficult     Review of Systems:   Pertinent items are noted in HPI Denies any headaches, blurred vision, fatigue, shortness of breath, chest pain, abdominal pain, abnormal vaginal discharge/itching/odor/irritation, problems with periods, bowel movements, urination, or intercourse unless otherwise stated above. Pertinent History Reviewed:  Reviewed past medical,surgical, social and family history.  Reviewed problem list, medications and allergies. Physical Assessment:   Vitals:   07/21/24 0905  BP: 138/89  Pulse: 76  Weight: 267 lb 4.8 oz (121.2 kg)  Body mass index is 43.14 kg/m.        Physical Examination:   General appearance - well appearing, and in no distress  Mental status - alert, oriented to person, place, and time  Psych:  She has a normal mood and affect  Skin - warm and dry, normal color, no suspicious lesions noted  Chest - effort normal, all lung fields clear to auscultation bilaterally  Heart - normal rate and regular rhythm  Abdomen - soft, nontender, nondistended, no masses or organomegaly  Pelvic -  VULVA: normal appearing vulva with no masses, tenderness or lesions   VAGINA: normal appearing vagina with normal color and discharge, no lesions   CERVIX: not visualized, anterior likely secondary to prior cesareans, mild discomfort with speculum, no CM T  Thin prep pap is done with HR  HPV cotesting  UTERUS: nontender  Extremities:  No swelling or varicosities noted  Chaperone present for exam  No results found for this or any previous visit (from the past 24 hours).    Assessment & Plan:   Assessment & Plan Well Woman Visit Annual visit conducted. Discussed cervical cancer screening, STI testing, and differences between HPV and HSV. Explained HPV's role in cervical cancer and importance of Pap smears. Discussed Gardasil vaccine for HPV prevention. - Performed Pap smear. - Conducted blood tests for hepatitis, syphilis, and HIV. - Performed STI testing for gonorrhea, chlamydia, and trichomonas.  Abnormal uterine bleeding Chronic heavy menstrual bleeding with clots. Previous  treatments ineffective. Discussed causes and management, including TXA and surgical options. Emphasized ultrasound and endometrial biopsy to rule out abnormalities before ablation. - Ordered pelvic ultrasound. - Prescribed TXA (Lysteda ) for menstrual bleeding. - Scheduled endometrial biopsy if indicated by ultrasound findings.  Cervical cancer screening Discussed importance of regular screening due to history of C-sections and cone biopsy. Explained Pap smear's role in detecting HPV-related changes. - Performed Pap smear. - Will consider endometrial biopsy if Pap smear indicates abnormalities.  Screening for sexually transmitted infections Agreed to STI testing as a precaution. Discussed STIs detectable through Pap smear and blood tests. - Conducted STI testing for gonorrhea, chlamydia, and trichomonas. - Performed blood tests for hepatitis, syphilis, and HIV.    Orders Placed This Encounter  Procedures   US  PELVIC COMPLETE WITH TRANSVAGINAL   RPR+HBsAg+HCVAb+...   TSH Rfx on Abnormal to Free T4   CBC   Iron , TIBC and Ferritin Panel    Meds: No orders of the defined types were placed in this encounter.   Follow-up: No follow-ups on file.  Carter Quarry, MD 07/21/2024 9:11  AM

## 2024-07-22 LAB — CBC
Hematocrit: 36.9 % (ref 34.0–46.6)
Hemoglobin: 10.2 g/dL — ABNORMAL LOW (ref 11.1–15.9)
MCH: 19.7 pg — ABNORMAL LOW (ref 26.6–33.0)
MCHC: 27.6 g/dL — ABNORMAL LOW (ref 31.5–35.7)
MCV: 71 fL — ABNORMAL LOW (ref 79–97)
Platelets: 524 x10E3/uL — ABNORMAL HIGH (ref 150–450)
RBC: 5.17 x10E6/uL (ref 3.77–5.28)
RDW: 16 % — ABNORMAL HIGH (ref 11.7–15.4)
WBC: 13.5 x10E3/uL — ABNORMAL HIGH (ref 3.4–10.8)

## 2024-07-22 LAB — RPR+HBSAG+HCVAB+...
HIV Screen 4th Generation wRfx: NONREACTIVE
Hep C Virus Ab: NONREACTIVE
Hepatitis B Surface Ag: NEGATIVE
RPR Ser Ql: NONREACTIVE

## 2024-07-22 LAB — TSH RFX ON ABNORMAL TO FREE T4: TSH: 1.64 u[IU]/mL (ref 0.450–4.500)

## 2024-07-23 ENCOUNTER — Ambulatory Visit: Payer: Self-pay | Admitting: Obstetrics and Gynecology

## 2024-07-24 ENCOUNTER — Ambulatory Visit (HOSPITAL_COMMUNITY): Admission: RE | Admit: 2024-07-24 | Source: Ambulatory Visit

## 2024-07-25 LAB — CYTOLOGY - PAP
Chlamydia: NEGATIVE
Comment: NEGATIVE
Comment: NEGATIVE
Comment: NEGATIVE
Comment: NORMAL
Diagnosis: NEGATIVE
High risk HPV: NEGATIVE
Neisseria Gonorrhea: NEGATIVE
Trichomonas: NEGATIVE

## 2024-07-26 ENCOUNTER — Encounter: Payer: Self-pay | Admitting: Gastroenterology

## 2024-07-26 ENCOUNTER — Ambulatory Visit: Admitting: Gastroenterology

## 2024-07-26 VITALS — BP 130/77 | HR 71 | Temp 97.8°F | Resp 18 | Ht 66.0 in | Wt 269.0 lb

## 2024-07-26 DIAGNOSIS — D128 Benign neoplasm of rectum: Secondary | ICD-10-CM

## 2024-07-26 DIAGNOSIS — D123 Benign neoplasm of transverse colon: Secondary | ICD-10-CM

## 2024-07-26 DIAGNOSIS — R7303 Prediabetes: Secondary | ICD-10-CM | POA: Diagnosis not present

## 2024-07-26 DIAGNOSIS — F419 Anxiety disorder, unspecified: Secondary | ICD-10-CM | POA: Diagnosis not present

## 2024-07-26 DIAGNOSIS — K635 Polyp of colon: Secondary | ICD-10-CM | POA: Diagnosis not present

## 2024-07-26 DIAGNOSIS — K5730 Diverticulosis of large intestine without perforation or abscess without bleeding: Secondary | ICD-10-CM

## 2024-07-26 DIAGNOSIS — Z1211 Encounter for screening for malignant neoplasm of colon: Secondary | ICD-10-CM

## 2024-07-26 DIAGNOSIS — I10 Essential (primary) hypertension: Secondary | ICD-10-CM | POA: Diagnosis not present

## 2024-07-26 DIAGNOSIS — K6389 Other specified diseases of intestine: Secondary | ICD-10-CM | POA: Diagnosis not present

## 2024-07-26 DIAGNOSIS — D124 Benign neoplasm of descending colon: Secondary | ICD-10-CM | POA: Diagnosis not present

## 2024-07-26 MED ORDER — SODIUM CHLORIDE 0.9 % IV SOLN: 500.0000 mL | INTRAVENOUS | Status: DC

## 2024-07-26 NOTE — Patient Instructions (Signed)
 Discharge instructions given. Handouts on polyps and Diverticulosis. RESUME PREVIOUS MEDICATIONS. YOU HAD AN ENDOSCOPIC PROCEDURE TODAY AT THE [facility]:   Refer to the procedure report that was given to you for any specific questions about what was found during the examination.  If the procedure report does not answer your questions, please call your gastroenterologist to clarify.  If you requested that your care partner not be given the details of your procedure findings, then the procedure report has been included in a sealed envelope for you to review at your convenience later.  YOU SHOULD EXPECT: Some feelings of bloating in the abdomen. Passage of more gas than usual.  Walking can help get rid of the air that was put into your GI tract during the procedure and reduce the bloating. If you had a lower endoscopy (such as a colonoscopy or flexible sigmoidoscopy) you may notice spotting of blood in your stool or on the toilet paper. If you underwent a bowel prep for your procedure, you may not have a normal bowel movement for a few days.  Please Note:  You might notice some irritation and congestion in your nose or some drainage.  This is from the oxygen used during your procedure.  There is no need for concern and it should clear up in a day or so.  SYMPTOMS TO REPORT IMMEDIATELY:  Following lower endoscopy (colonoscopy or flexible sigmoidoscopy):  Excessive amounts of blood in the stool  Significant tenderness or worsening of abdominal pains  Swelling of the abdomen that is new, acute  Fever of 100F or higher   For urgent or emergent issues, a gastroenterologist can be reached at any hour by calling [phone]. Do not use MyChart messaging for urgent concerns.    DIET:  We do recommend a small meal at first, but then you may proceed to your regular diet.  Drink plenty of fluids but you should avoid alcoholic beverages for 24 hours.  ACTIVITY:  You should plan to take it  easy for the rest of today and you should NOT DRIVE or use heavy machinery until tomorrow (because of the sedation medicines used during the test).    FOLLOW UP: Our staff will call the number listed on your records the next business day following your procedure.  We will call around 7:15- 8:00 am to check on you and address any questions or concerns that you may have regarding the information given to you following your procedure. If we do not reach you, we will leave a message.     If any biopsies were taken you will be contacted by phone or by letter within the next 1-3 weeks.  Please call us  at [phone] if you have not heard about the biopsies in 3 weeks.    SIGNATURES/CONFIDENTIALITY: You and/or your care partner have signed paperwork which will be entered into your electronic medical record.  These signatures attest to the fact that that the information above on your After Visit Summary has been reviewed and is understood.  Full responsibility of the confidentiality of this discharge information lies with you and/or your care-partner.

## 2024-07-26 NOTE — Progress Notes (Signed)
 [facility] History and Physical   Primary Care Physician:  Waymond Cart, MD   Reason for Procedure:   Colon cancer screening  Plan:    Screening colonoscopy   HPI: Hong Timm is a 48 y.o. female undergoing initial average risk screening colonoscopy.  She has no family history of colon cancer and no chronic GI symptoms.   The patient was provided an opportunity to ask questions and all were answered. The patient agreed with the plan   Past Medical History:  Diagnosis Date   Acute bacterial bronchitis 11/05/2015   Allergy    Anemia    Anxiety    Arthritis    Asthma    childhood asthma   Back pain    Boil    tendency to form boils   Depression    has been treated in past-no present meds   Family history of anesthesia complication 2008   son- age 44 - had lockjaw after surgery (T&A)   Gestational diabetes mellitus (GDM), antepartum 08/17/2016   Fasting on 2 hour was 107   Headache(784.0)    Healthcare maintenance 02/24/2024   Hyperglycemia    Hypertension    Hypertriglyceridemia    Menorrhagia 10/29/2015   Mixed diabetic hyperlipidemia associated with type 2 diabetes mellitus (HCC) 05/23/2020   PCOS (polycystic ovarian syndrome)    Pneumonia    AS A CHILD   Prediabetes     Past Surgical History:  Procedure Laterality Date   CERVICAL CONIZATION W/BX N/A 10/31/2015   Procedure: CONIZATION CERVIX WITH BIOPSY;  Surgeon: Shanda SHAUNNA Muscat, MD;  Location: [facility];  Service: Gynecology;  Laterality: N/A;   CESAREAN SECTION     x2 previous   CESAREAN SECTION  07/03/2011   Procedure: CESAREAN SECTION;  Surgeon: Nena DELENA App, MD;  Location: [facility];  Service: Gynecology;  Laterality: N/A;   CESAREAN SECTION N/A 06/22/2013   Procedure: CESAREAN SECTION, repeat;  Surgeon: Shanda SHAUNNA Muscat, MD;  Location: [facility];  Service: Obstetrics;  Laterality: N/A;   CESAREAN SECTION WITH BILATERAL TUBAL LIGATION Bilateral 02/14/2017   Procedure: CESAREAN SECTION WITH BILATERAL  TUBAL LIGATION;  Surgeon: Herchel Gloris DELENA, MD;  Location: [facility];  Service: Obstetrics;  Laterality: Bilateral;   IUD REMOVAL N/A 10/31/2015   Procedure: INTRAUTERINE DEVICE (IUD) REMOVAL;  Surgeon: Shanda SHAUNNA Muscat, MD;  Location: [facility];  Service: Gynecology;  Laterality: N/A;   MOUTH SURGERY  2004    Prior to Admission medications   Medication Sig Start Date End Date Taking? Authorizing Provider  amLODipine -valsartan  (EXFORGE ) 5-160 MG tablet TAKE 1 TABLET BY MOUTH EVERY DAY 05/22/24  Yes Waymond Cart, MD  atorvastatin  (LIPITOR) 20 MG tablet TAKE 1 TABLET (20 MG TOTAL) BY MOUTH AT BEDTIME. 02/23/24  Yes Elicia Sharper, DO  buPROPion  (WELLBUTRIN  XL) 300 MG 24 hr tablet Take 1 tablet (300 mg total) by mouth every morning. 02/07/24 02/06/25 Yes Okey Barnie SAUNDERS, MD  citalopram  (CELEXA ) 20 MG tablet Take 1 tablet (20 mg total) by mouth daily. 01/28/24  Yes Okey Barnie SAUNDERS, MD  QUEtiapine  (SEROQUEL ) 25 MG tablet Take 1 tablet (25 mg total) by mouth at bedtime. 02/07/24 02/06/25 Yes Okey Barnie SAUNDERS, MD  albuterol  (VENTOLIN  HFA) 108 (90 Base) MCG/ACT inhaler Inhale 1-2 puffs into the lungs every 6 (six) hours as needed for wheezing or shortness of breath. 04/12/24   Mecum, Erin E, PA-C  Ibuprofen  200 MG CAPS Ibuprofen     [provider]  tirzepatide  (MOUNJARO ) 15 MG/0.5ML Pen  Inject 15 mg into the skin once a week. 06/08/24   D'Mello, Rosalyn, DO  tranexamic acid  (LYSTEDA ) 650 MG TABS tablet Take 2 tablets (1,300 mg total) by mouth 3 (three) times daily. Take during menses for a maximum of five days 07/21/24   Ajewole, Christana, MD    Current Outpatient Medications  Medication Sig Dispense Refill   amLODipine -valsartan  (EXFORGE ) 5-160 MG tablet TAKE 1 TABLET BY MOUTH EVERY DAY 90 tablet 1   atorvastatin  (LIPITOR) 20 MG tablet TAKE 1 TABLET (20 MG TOTAL) BY MOUTH AT BEDTIME. 90 tablet 3   buPROPion  (WELLBUTRIN  XL) 300 MG 24 hr tablet Take 1 tablet (300 mg total) by mouth every morning. 90  tablet 2   citalopram  (CELEXA ) 20 MG tablet Take 1 tablet (20 mg total) by mouth daily. 90 tablet 2   QUEtiapine  (SEROQUEL ) 25 MG tablet Take 1 tablet (25 mg total) by mouth at bedtime. 90 tablet 2   albuterol  (VENTOLIN  HFA) 108 (90 Base) MCG/ACT inhaler Inhale 1-2 puffs into the lungs every 6 (six) hours as needed for wheezing or shortness of breath. 8 g 0   Ibuprofen  200 MG CAPS Ibuprofen      tirzepatide  (MOUNJARO ) 15 MG/0.5ML Pen Inject 15 mg into the skin once a week. 0.5 mL 1   tranexamic acid  (LYSTEDA ) 650 MG TABS tablet Take 2 tablets (1,300 mg total) by mouth 3 (three) times daily. Take during menses for a maximum of five days 30 tablet 2   Current Facility-Administered Medications  Medication Dose Route Frequency Provider Last Rate Last Admin   0.9 %  sodium chloride  infusion  500 mL Intravenous Continuous Stacia Glendia BRAVO, MD        Allergies as of 07/26/2024 - Review Complete 07/26/2024  Allergen Reaction Noted   Latex Rash and Itching 06/18/2011   Lisinopril  Cough 10/13/2023   Metformin  and related Diarrhea 07/08/2022    Family History  Problem Relation Age of Onset   Diabetes Mother    Hypertension Mother    Hyperlipidemia Mother    Arthritis Mother    Anxiety disorder Mother    Depression Mother    Lung cancer Mother        smoked   Hypertension Father    Anxiety disorder Father    Hypertension Paternal Grandmother    Hyperlipidemia Paternal Grandmother    Anesthesia problems Son        son-had T&A-2008-had trismus   Colon cancer Neg Hx    Colon polyps Neg Hx    Esophageal cancer Neg Hx    Rectal cancer Neg Hx    Stomach cancer Neg Hx     Social History   Socioeconomic History   Marital status: Single    Spouse name: Not on file   Number of children: Not on file   Years of education: Not on file   Highest education level: Not on file  Occupational History   Occupation: Customer Care Rep  Tobacco Use   Smoking status: Some Days    Current  packs/day: 0.10    Average packs/day: 0.1 packs/day for 28.0 years (2.8 ttl pk-yrs)    Types: Cigarettes    Passive exposure: Past   Smokeless tobacco: Never   Tobacco comments:    1 pk per week  Vaping Use   Vaping status: Never Used  Substance and Sexual Activity   Alcohol use: Not Currently    Comment: occasional    Drug use: No   Sexual activity: Yes  Birth control/protection: None  Other Topics Concern   Not on file  Social History Narrative   Not on file   Social Drivers of Health   Financial Resource Strain: Not on file  Food Insecurity: Food Insecurity Present (07/21/2024)   Hunger Vital Sign    Worried About Running Out of Food in the Last Year: Sometimes true    Ran Out of Food in the Last Year: Never true  Transportation Needs: No Transportation Needs (07/21/2024)   PRAPARE - Administrator, Civil Service (Medical): No    Lack of Transportation (Non-Medical): No  Physical Activity: Not on file  Stress: Not on file  Social Connections: Socially Isolated (02/09/2023)   Social Connection and Isolation Panel    Frequency of Communication with Friends and Family: More than three times a week    Frequency of Social Gatherings with Friends and Family: More than three times a week    Attends Religious Services: Never    Database Administrator or Organizations: No    Attends Banker Meetings: Never    Marital Status: Divorced  Catering Manager Violence: Not At Risk (02/09/2023)   Humiliation, Afraid, Rape, and Kick questionnaire    Fear of Current or Ex-Partner: No    Emotionally Abused: No    Physically Abused: No    Sexually Abused: No    Review of Systems:  All other review of systems negative except as mentioned in the HPI.  Physical Exam: Vital signs BP 127/86   Pulse 76   Temp 97.8 F (36.6 C) (Temporal)   Ht 5' 6 (1.676 m)   Wt 269 lb (122 kg)   LMP 07/13/2024 (Exact Date)   SpO2 99%   BMI 43.42 kg/m   General:    Alert,  Well-developed, well-nourished, pleasant and cooperative in NAD Airway:  Mallampati 2 Lungs:  Clear throughout to auscultation.   Heart:  Regular rate and rhythm; no murmurs, clicks, rubs,  or gallops. Abdomen:  Soft, nontender and nondistended. Normal bowel sounds.   Neuro/Psych:  Normal mood and affect. A and O x 3   Diarra Ceja E. Stacia, MD [facility]

## 2024-07-26 NOTE — Op Note (Signed)
 [facility] Patient Name: Teresa Perez Procedure Date: 07/26/2024 9:19 AM MRN: [id number] Endoscopist: Glendia E. Stacia , MD, [id number] Age: 48 Referring MD:  Date of Birth: May 18, 1976 Gender: Female Account #: [id number] Procedure:                Colonoscopy Indications:              Screening for colorectal malignant neoplasm, This                            is the patient's first colonoscopy Medicines:                Monitored Anesthesia Care Procedure:                Pre-Anesthesia Assessment:                           - Prior to the procedure, a History and Physical                            was performed, and patient medications and                            allergies were reviewed. The patient's tolerance of                            previous anesthesia was also reviewed. The risks                            and benefits of the procedure and the sedation                            options and risks were discussed with the patient.                            All questions were answered, and informed consent                            was obtained. Prior Anticoagulants: The patient has                            taken no anticoagulant or antiplatelet agents. ASA                            Grade Assessment: III - A patient with severe                            systemic disease. After reviewing the risks and                            benefits, the patient was deemed in satisfactory                            condition to undergo the procedure.  After obtaining informed consent, the colonoscope                            was passed under direct vision. Throughout the                            procedure, the patient's blood pressure, pulse, and                            oxygen saturations were monitored continuously. The                            PCF-HQ190L Colonoscope [id number] was introduced                            through the anus  and advanced to the the terminal                            ileum, with identification of the appendiceal                            orifice and IC valve. The colonoscopy was performed                            without difficulty. The patient tolerated the                            procedure well. The quality of the bowel                            preparation was good. The terminal ileum, ileocecal                            valve, appendiceal orifice, and rectum were                            photographed. The bowel preparation used was SUPREP                            via split dose instruction. Scope In: 9:32:33 AM Scope Out: 9:51:43 AM Scope Withdrawal Time: 0 hours 16 minutes 26 seconds  Total Procedure Duration: 0 hours 19 minutes 10 seconds  Findings:                 The perianal and digital rectal examinations were                            normal. Pertinent negatives include normal                            sphincter tone and no palpable rectal lesions.                           A 4 mm polyp was found in the transverse colon. The  polyp was sessile. The polyp was removed with a                            cold snare. Resection and retrieval were complete.                            Estimated blood loss was minimal.                           A 4 mm polyp was found in the descending colon. The                            polyp was sessile. The polyp was removed with a                            cold snare. Resection and retrieval were complete.                            Estimated blood loss was minimal.                           A 13 mm polyp was found in the rectum. The polyp                            was pedunculated. The polyp was removed with a hot                            snare. Resection and retrieval were complete.                            Estimated blood loss: none.                           A few medium-mouthed and small-mouthed  diverticula                            were found in the sigmoid colon.                           The exam was otherwise normal throughout the                            examined colon.                           The terminal ileum appeared normal.                           The retroflexed view of the distal rectum and anal                            verge was normal and showed no anal or rectal  abnormalities. Complications:            No immediate complications. Estimated Blood Loss:     Estimated blood loss was minimal. Impression:               - One 4 mm polyp in the transverse colon, removed                            with a cold snare. Resected and retrieved.                           - One 4 mm polyp in the descending colon, removed                            with a cold snare. Resected and retrieved.                           - One 13 mm polyp in the rectum, removed with a hot                            snare. Resected and retrieved.                           - Mild diverticulosis in the sigmoid colon.                           - The examined portion of the ileum was normal.                           - The distal rectum and anal verge are normal on                            retroflexion view. Recommendation:           - Patient has a contact number available for                            emergencies. The signs and symptoms of potential                            delayed complications were discussed with the                            patient. Return to normal activities tomorrow.                            Written discharge instructions were provided to the                            patient.                           - Resume previous diet.                           - Continue present medications.                           -  Await pathology results.                           - Repeat colonoscopy date to be determined after                             pending pathology results are reviewed for                            surveillance based on pathology results. Mattison Stuckey E. Stacia, MD 07/26/2024 10:01:24 AM This report has been signed electronically.

## 2024-07-26 NOTE — Progress Notes (Signed)
 Called to room to assist during endoscopic procedure.  Patient ID and intended procedure confirmed with present staff. Received instructions for my participation in the procedure from the performing physician.

## 2024-07-26 NOTE — Progress Notes (Signed)
 Sedate, gd SR, tolerated procedure well, VSS, report to RN

## 2024-07-28 ENCOUNTER — Ambulatory Visit (HOSPITAL_COMMUNITY): Admission: RE | Admit: 2024-07-28 | Source: Ambulatory Visit

## 2024-07-31 ENCOUNTER — Telehealth: Payer: Self-pay

## 2024-07-31 ENCOUNTER — Ambulatory Visit (HOSPITAL_COMMUNITY)
Admission: RE | Admit: 2024-07-31 | Discharge: 2024-07-31 | Disposition: A | Source: Ambulatory Visit | Attending: Obstetrics and Gynecology

## 2024-07-31 DIAGNOSIS — F3132 Bipolar disorder, current episode depressed, moderate: Secondary | ICD-10-CM | POA: Diagnosis not present

## 2024-07-31 DIAGNOSIS — D251 Intramural leiomyoma of uterus: Secondary | ICD-10-CM | POA: Diagnosis not present

## 2024-07-31 DIAGNOSIS — F4312 Post-traumatic stress disorder, chronic: Secondary | ICD-10-CM | POA: Diagnosis not present

## 2024-07-31 DIAGNOSIS — N939 Abnormal uterine and vaginal bleeding, unspecified: Secondary | ICD-10-CM | POA: Insufficient documentation

## 2024-07-31 DIAGNOSIS — F411 Generalized anxiety disorder: Secondary | ICD-10-CM | POA: Diagnosis not present

## 2024-07-31 NOTE — Telephone Encounter (Signed)
 Left message on answering machine.

## 2024-08-02 LAB — SURGICAL PATHOLOGY

## 2024-08-08 ENCOUNTER — Ambulatory Visit: Payer: Self-pay | Admitting: Gastroenterology

## 2024-08-08 NOTE — Progress Notes (Signed)
 Ms. Goranson,   Two of the three polyps that I removed during your recent procedure were completely benign but were proven to be pre-cancerous polyps that MAY have grown into cancers if they had not been removed.  One polyp was not precancerous.  Studies shows that at least 20% of women over age 49 and 30% of men over age 5 have pre-cancerous polyps.  Based on current nationally recognized surveillance guidelines, I recommend that you have a repeat colonoscopy in 3 years.   If you develop any new rectal bleeding, abdominal pain or significant bowel habit changes, please contact me before then.

## 2024-08-09 DIAGNOSIS — F4312 Post-traumatic stress disorder, chronic: Secondary | ICD-10-CM | POA: Diagnosis not present

## 2024-08-09 DIAGNOSIS — F3132 Bipolar disorder, current episode depressed, moderate: Secondary | ICD-10-CM | POA: Diagnosis not present

## 2024-08-09 DIAGNOSIS — F411 Generalized anxiety disorder: Secondary | ICD-10-CM | POA: Diagnosis not present

## 2024-08-18 ENCOUNTER — Telehealth: Admitting: Physician Assistant

## 2024-08-18 DIAGNOSIS — B9689 Other specified bacterial agents as the cause of diseases classified elsewhere: Secondary | ICD-10-CM | POA: Diagnosis not present

## 2024-08-18 DIAGNOSIS — J208 Acute bronchitis due to other specified organisms: Secondary | ICD-10-CM | POA: Diagnosis not present

## 2024-08-18 MED ORDER — ALBUTEROL SULFATE HFA 108 (90 BASE) MCG/ACT IN AERS: 1.0000 | INHALATION_SPRAY | Freq: Four times a day (QID) | RESPIRATORY_TRACT | 0 refills | Status: AC | PRN

## 2024-08-18 MED ORDER — PROMETHAZINE-DM 6.25-15 MG/5ML PO SYRP: 5.0000 mL | ORAL_SOLUTION | Freq: Four times a day (QID) | ORAL | 0 refills | Status: DC | PRN

## 2024-08-18 MED ORDER — AZITHROMYCIN 250 MG PO TABS: ORAL_TABLET | ORAL | 0 refills | Status: AC

## 2024-08-18 NOTE — Patient Instructions (Signed)
 " Teresa Perez, thank you for joining Delon CHRISTELLA Dickinson, PA-C for today's virtual visit.  While this provider is not your primary care provider (PCP), if your PCP is located in our provider database this encounter information will be shared with them immediately following your visit.   A [facility] MyChart account gives you access to today's visit and all your visits, tests, and labs performed at [facility]  click here if you don't have a [facility] MyChart account or go to [email or web address]  Consent: (Patient) Teresa Perez provided verbal consent for this virtual visit at the beginning of the encounter.  Current Medications:  Current Outpatient Medications:    albuterol  (VENTOLIN  HFA) 108 (90 Base) MCG/ACT inhaler, Inhale 1-2 puffs into the lungs every 6 (six) hours as needed., Disp: 8 g, Rfl: 0   azithromycin  (ZITHROMAX ) 250 MG tablet, Take 2 tablets on day 1, then 1 tablet daily on days 2 through 5, Disp: 6 tablet, Rfl: 0   promethazine -dextromethorphan (PROMETHAZINE -DM) 6.25-15 MG/5ML syrup, Take 5 mLs by mouth 4 (four) times daily as needed., Disp: 118 mL, Rfl: 0   amLODipine -valsartan  (EXFORGE ) 5-160 MG tablet, TAKE 1 TABLET BY MOUTH EVERY DAY, Disp: 90 tablet, Rfl: 1   atorvastatin  (LIPITOR) 20 MG tablet, TAKE 1 TABLET (20 MG TOTAL) BY MOUTH AT BEDTIME., Disp: 90 tablet, Rfl: 3   buPROPion  (WELLBUTRIN  XL) 300 MG 24 hr tablet, Take 1 tablet (300 mg total) by mouth every morning., Disp: 90 tablet, Rfl: 2   citalopram  (CELEXA ) 20 MG tablet, Take 1 tablet (20 mg total) by mouth daily., Disp: 90 tablet, Rfl: 2   Ibuprofen  200 MG CAPS, Ibuprofen , Disp: , Rfl:    QUEtiapine  (SEROQUEL ) 25 MG tablet, Take 1 tablet (25 mg total) by mouth at bedtime., Disp: 90 tablet, Rfl: 2   tirzepatide  (MOUNJARO ) 15 MG/0.5ML Pen, Inject 15 mg into the skin once a week., Disp: 0.5 mL, Rfl: 1   tranexamic acid  (LYSTEDA ) 650 MG TABS tablet, Take 2 tablets (1,300 mg total) by mouth 3 (three)  times daily. Take during menses for a maximum of five days, Disp: 30 tablet, Rfl: 2   Medications ordered in this encounter:  Meds ordered this encounter  Medications   azithromycin  (ZITHROMAX ) 250 MG tablet    Sig: Take 2 tablets on day 1, then 1 tablet daily on days 2 through 5    Dispense:  6 tablet    Refill:  0    Supervising Provider:   LAMPTEY, PHILIP O [[id number]]   promethazine -dextromethorphan (PROMETHAZINE -DM) 6.25-15 MG/5ML syrup    Sig: Take 5 mLs by mouth 4 (four) times daily as needed.    Dispense:  118 mL    Refill:  0    Supervising Provider:   LAMPTEY, PHILIP O [id number]   albuterol  (VENTOLIN  HFA) 108 (90 Base) MCG/ACT inhaler    Sig: Inhale 1-2 puffs into the lungs every 6 (six) hours as needed.    Dispense:  8 g    Refill:  0    Supervising Provider:   BLAISE ALEENE KIDD [[id number]]     *If you need refills on other medications prior to your next appointment, please contact your pharmacy*  Follow-Up: Call back or seek an in-person evaluation if the symptoms worsen or if the condition fails to improve as anticipated.  [facility] [phone]  Other Instructions Acute Bronchitis, Adult  Acute bronchitis is sudden inflammation of the main airways (bronchi) that come off the windpipe (trachea) in  the lungs. The swelling causes the airways to get smaller and make more mucus than normal. This can make it hard to breathe and can cause coughing or noisy breathing (wheezing). Acute bronchitis may last several weeks. The cough may last longer. Allergies, asthma, and exposure to smoke may make the condition worse. What are the causes? This condition can be caused by germs and by substances that irritate the lungs, including: Cold and flu viruses. The most common cause of this condition is the virus that causes the common cold. Bacteria. This is less common. Breathing in substances that irritate the lungs, including: Smoke from cigarettes and other forms of  tobacco. Dust and pollen. Fumes from household cleaning products, gases, or burned fuel. Indoor or outdoor air pollution. What increases the risk? The following factors may make you more likely to develop this condition: A weak body's defense system, also called the immune system. A condition that affects your lungs and breathing, such as asthma. What are the signs or symptoms? Common symptoms of this condition include: Coughing. This may bring up clear, yellow, or green mucus from your lungs (sputum). Wheezing. Runny or stuffy nose. Having too much mucus in your lungs (chest congestion). Shortness of breath. Aches and pains, including sore throat or chest. How is this diagnosed? This condition is usually diagnosed based on: Your symptoms and medical history. A physical exam. You may also have other tests, including tests to rule out other conditions, such as pneumonia. These tests include: A test of lung function. Test of a mucus sample to look for the presence of bacteria. Tests to check the oxygen level in your blood. Blood tests. Chest X-ray. How is this treated? Most cases of acute bronchitis clear up over time without treatment. Your health care provider may recommend: Drinking more fluids to help thin your mucus so it is easier to cough up. Taking inhaled medicine (inhaler) to improve air flow in and out of your lungs. Using a vaporizer or a humidifier. These are machines that add water to the air to help you breathe better. Taking a medicine that thins mucus and clears congestion (expectorant). Taking a medicine that prevents or stops coughing (cough suppressant). It is not common to take an antibiotic medicine for this condition. Follow these instructions at home:  Take over-the-counter and prescription medicines only as told by your health care provider. Use an inhaler, vaporizer, or humidifier as told by your health care provider. Take two teaspoons (10 mL) of honey  at bedtime to lessen coughing at night. Drink enough fluid to keep your urine pale yellow. Do not use any products that contain nicotine  or tobacco. These products include cigarettes, chewing tobacco, and vaping devices, such as e-cigarettes. If you need help quitting, ask your health care provider. Get plenty of rest. Return to your normal activities as told by your health care provider. Ask your health care provider what activities are safe for you. Keep all follow-up visits. This is important. How is this prevented? To lower your risk of getting this condition again: Wash your hands often with soap and water for at least 20 seconds. If soap and water are not available, use hand sanitizer. Avoid contact with people who have cold symptoms. Try not to touch your mouth, nose, or eyes with your hands. Avoid breathing in smoke or chemical fumes. Breathing smoke or chemical fumes will make your condition worse. Get the flu shot every year. Contact a health care provider if: Your symptoms do not improve  after 2 weeks. You have trouble coughing up the mucus. Your cough keeps you awake at night. You have a fever. Get help right away if you: Cough up blood. Feel pain in your chest. Have severe shortness of breath. Faint or keep feeling like you are going to faint. Have a severe headache. Have a fever or chills that get worse. These symptoms may represent a serious problem that is an emergency. Do not wait to see if the symptoms will go away. Get medical help right away. Call your local emergency services (911 in the [place] Do not drive yourself to the hospital. Summary Acute bronchitis is inflammation of the main airways (bronchi) that come off the windpipe (trachea) in the lungs. The swelling causes the airways to get smaller and make more mucus than normal. Drinking more fluids can help thin your mucus so it is easier to cough up. Take over-the-counter and prescription medicines only as told  by your health care provider. Do not use any products that contain nicotine  or tobacco. These products include cigarettes, chewing tobacco, and vaping devices, such as e-cigarettes. If you need help quitting, ask your health care provider. Contact a health care provider if your symptoms do not improve after 2 weeks. This information is not intended to replace advice given to you by your health care provider. Make sure you discuss any questions you have with your health care provider. Document Revised: 11/27/2021 Document Reviewed: 12/18/2020 Elsevier Patient Education  2024 Elsevier Inc.   If you have been instructed to have an in-person evaluation today at a local Urgent Care facility, please use the link below. It will take you to a list of all of our available [facility], including address, phone number and hours of operation. Please do not delay care.  [facility]  If you or a family member do not have a primary care provider, use the link below to schedule a visit and establish care. When you choose a [facility] primary care physician or advanced practice provider, you gain a long-term partner in health. Find a Primary Care Provider  Learn more about [facility]'s in-office and virtual care options: [facility] Get Care Now "

## 2024-08-18 NOTE — Progress Notes (Signed)
 " Virtual Visit Consent   Teresa Perez, you are scheduled for a virtual visit with a [facility] provider today. Just as with appointments in the office, your consent must be obtained to participate. Your consent will be active for this visit and any virtual visit you may have with one of our providers in the next 365 days. If you have a MyChart account, a copy of this consent can be sent to you electronically.  As this is a virtual visit, video technology does not allow for your provider to perform a traditional examination. This may limit your provider's ability to fully assess your condition. If your provider identifies any concerns that need to be evaluated in person or the need to arrange testing (such as labs, EKG, etc.), we will make arrangements to do so. Although advances in technology are sophisticated, we cannot ensure that it will always work on either your end or our end. If the connection with a video visit is poor, the visit may have to be switched to a telephone visit. With either a video or telephone visit, we are not always able to ensure that we have a secure connection.  By engaging in this virtual visit, you consent to the provision of healthcare and authorize for your insurance to be billed (if applicable) for the services provided during this visit. Depending on your insurance coverage, you may receive a charge related to this service.  I need to obtain your verbal consent now. Are you willing to proceed with your visit today? Teresa Perez has provided verbal consent on 08/18/2024 for a virtual visit (video or telephone). Teresa CHRISTELLA Dickinson, PA-C  Date: 08/18/2024 12:39 PM   Virtual Visit via Video Note   IDelon CHRISTELLA Perez, connected with  Teresa Perez  ([id number], 05-Aug-1976) on 08/18/2024 at 12:30 PM EST by a video-enabled telemedicine application and verified that I am speaking with the correct person using two identifiers.  Location: Patient: Virtual Visit Location  Patient: Home Provider: Virtual Visit Location Provider: Home Office   I discussed the limitations of evaluation and management by telemedicine and the availability of in person appointments. The patient expressed understanding and agreed to proceed.    History of Present Illness: Teresa Perez is a 48 y.o. who identifies as a female who was assigned female at birth, and is being seen today for cough and congestion following flu-like illness.  HPI: URI  This is a new problem. The current episode started in the past 7 days. The problem has been unchanged. The maximum temperature recorded prior to her arrival was 100.4 - 100.9 F (100.5). The fever has been present for 1 to 2 days. Associated symptoms include congestion, coughing (productive of white phlegm), headaches, a plugged ear sensation, rhinorrhea (and post nasal drainage), sinus pain, a sore throat (initially, but improving; does recur with coughing or mouth dry) and wheezing. Pertinent negatives include no diarrhea, ear pain, nausea or vomiting. Associated symptoms comments: Body aches, mild shortness of breath following cough. She has tried inhaler use (albuterol , ibuprofen , elderberry tea) for the symptoms. The treatment provided no relief.   Has had Influenza A in the house    Problems:  Patient Active Problem List   Diagnosis Date Noted   Chronic pain of right knee 06/08/2024   Encounter for immunization 02/24/2024   Genital warts 07/02/2023   Weight gain 03/16/2023   Chronic cough 03/16/2023   Psoriasis 02/09/2023   Tobacco use 02/09/2023   Anxious mood as adjustment reaction  06/06/2020   Hypertension 05/23/2020   Hyperlipidemia 05/23/2020   GAD (generalized anxiety disorder) 05/23/2020   Adjustment reaction with anxiety and depression 07/29/2017   Type 2 diabetes mellitus with hyperlipidemia (HCC) 07/29/2017   History of conization of cervix affecting pregnancy, antepartum 08/05/2016   Vitamin D  deficiency 01/09/2014    Depression 01/02/2014   PCOS (polycystic ovarian syndrome) 01/02/2014   Anemia 06/24/2013   Severe obesity (BMI >= 40) (HCC) 06/22/2013    Allergies: Allergies[1] Medications: Current Medications[2]  Observations/Objective: Patient is well-developed, well-nourished in no acute distress.  Resting comfortably at home.  Head is normocephalic, atraumatic.  No labored breathing.  Speech is clear and coherent with logical content.  Patient is alert and oriented at baseline.  Frequent and dry cough heard frequently without affecting speech  Assessment and Plan: 1. Acute bacterial bronchitis (Primary) - azithromycin  (ZITHROMAX ) 250 MG tablet; Take 2 tablets on day 1, then 1 tablet daily on days 2 through 5  Dispense: 6 tablet; Refill: 0 - promethazine -dextromethorphan (PROMETHAZINE -DM) 6.25-15 MG/5ML syrup; Take 5 mLs by mouth 4 (four) times daily as needed.  Dispense: 118 mL; Refill: 0 - albuterol  (VENTOLIN  HFA) 108 (90 Base) MCG/ACT inhaler; Inhale 1-2 puffs into the lungs every 6 (six) hours as needed.  Dispense: 8 g; Refill: 0  - Worsening over a week despite OTC medications - Will treat with Z-pack and Promethazine  DM - Can add Mucinex (PLAIN) during the daytime - Push fluids.  - Rest.  - Steam and humidifier can help - Seek in person evaluation if worsening or symptoms fail to improve    Follow Up Instructions: I discussed the assessment and treatment plan with the patient. The patient was provided an opportunity to ask questions and all were answered. The patient agreed with the plan and demonstrated an understanding of the instructions.  A copy of instructions were sent to the patient via MyChart unless otherwise noted below.    The patient was advised to call back or seek an in-person evaluation if the symptoms worsen or if the condition fails to improve as anticipated.    Teresa CHRISTELLA Dickinson, PA-C     [1]  Allergies Allergen Reactions   Latex Rash and Itching    Lisinopril  Cough   Metformin  And Related Diarrhea  [2]  Current Outpatient Medications:    albuterol  (VENTOLIN  HFA) 108 (90 Base) MCG/ACT inhaler, Inhale 1-2 puffs into the lungs every 6 (six) hours as needed., Disp: 8 g, Rfl: 0   azithromycin  (ZITHROMAX ) 250 MG tablet, Take 2 tablets on day 1, then 1 tablet daily on days 2 through 5, Disp: 6 tablet, Rfl: 0   promethazine -dextromethorphan (PROMETHAZINE -DM) 6.25-15 MG/5ML syrup, Take 5 mLs by mouth 4 (four) times daily as needed., Disp: 118 mL, Rfl: 0   amLODipine -valsartan  (EXFORGE ) 5-160 MG tablet, TAKE 1 TABLET BY MOUTH EVERY DAY, Disp: 90 tablet, Rfl: 1   atorvastatin  (LIPITOR) 20 MG tablet, TAKE 1 TABLET (20 MG TOTAL) BY MOUTH AT BEDTIME., Disp: 90 tablet, Rfl: 3   buPROPion  (WELLBUTRIN  XL) 300 MG 24 hr tablet, Take 1 tablet (300 mg total) by mouth every morning., Disp: 90 tablet, Rfl: 2   citalopram  (CELEXA ) 20 MG tablet, Take 1 tablet (20 mg total) by mouth daily., Disp: 90 tablet, Rfl: 2   Ibuprofen  200 MG CAPS, Ibuprofen , Disp: , Rfl:    QUEtiapine  (SEROQUEL ) 25 MG tablet, Take 1 tablet (25 mg total) by mouth at bedtime., Disp: 90 tablet, Rfl: 2   tirzepatide  (MOUNJARO ) 15  MG/0.5ML Pen, Inject 15 mg into the skin once a week., Disp: 0.5 mL, Rfl: 1   tranexamic acid  (LYSTEDA ) 650 MG TABS tablet, Take 2 tablets (1,300 mg total) by mouth 3 (three) times daily. Take during menses for a maximum of five days, Disp: 30 tablet, Rfl: 2  "

## 2024-08-23 ENCOUNTER — Ambulatory Visit
Admission: EM | Admit: 2024-08-23 | Discharge: 2024-08-23 | Disposition: A | Discharge status: Home / Self Care | Attending: Family Medicine | Admitting: Family Medicine

## 2024-08-23 DIAGNOSIS — E119 Type 2 diabetes mellitus without complications: Secondary | ICD-10-CM

## 2024-08-23 DIAGNOSIS — J40 Bronchitis, not specified as acute or chronic: Secondary | ICD-10-CM | POA: Diagnosis not present

## 2024-08-23 DIAGNOSIS — J329 Chronic sinusitis, unspecified: Secondary | ICD-10-CM

## 2024-08-23 DIAGNOSIS — J4531 Mild persistent asthma with (acute) exacerbation: Secondary | ICD-10-CM

## 2024-08-23 MED ORDER — AMOXICILLIN-POT CLAVULANATE 875-125 MG PO TABS: 1.0000 | ORAL_TABLET | Freq: Two times a day (BID) | ORAL | 0 refills | Status: DC

## 2024-08-23 MED ORDER — PREDNISONE 20 MG PO TABS: ORAL_TABLET | ORAL | 0 refills | Status: DC

## 2024-08-23 MED ORDER — PROMETHAZINE-DM 6.25-15 MG/5ML PO SYRP: 5.0000 mL | ORAL_SOLUTION | Freq: Three times a day (TID) | ORAL | 0 refills | Status: DC | PRN

## 2024-08-23 NOTE — ED Provider Notes (Signed)
 " Producer, Television/film/video - URG[facility]  Note:  This document was prepared using Conservation officer, historic buildings and may include unintentional dictation errors.  MRN: [id number] DOB: 1976-07-19  Subjective:   Teresa Perez is a 48 y.o. female presenting for 2 week history of persistent sinus congestion, sinus drainage, chest congestion, productive cough. Has been blowing bloody mucus from her nose. No fever, chest pain, wheezing. Has asthma, has been using her inhaler. No smoking of any kind including cigarettes, cigars, vaping, marijuana use.  Had an e-visit and underwent a course of azithromycin  and still feels the same.  Her type 2 diabetes is treated without insulin , well-controlled.  Current Outpatient Medications  Medication Instructions   albuterol  (VENTOLIN  HFA) 108 (90 Base) MCG/ACT inhaler 1-2 puffs, Inhalation, Every 6 hours PRN   amLODipine -valsartan  (EXFORGE ) 5-160 MG tablet 1 tablet, Oral, Daily   atorvastatin  (LIPITOR) 20 MG tablet TAKE 1 TABLET (20 MG TOTAL) BY MOUTH AT BEDTIME.   azithromycin  (ZITHROMAX ) 250 MG tablet Take 2 tablets on day 1, then 1 tablet daily on days 2 through 5   buPROPion  (WELLBUTRIN  XL) 300 mg, Oral, BH-each morning   citalopram  (CELEXA ) 20 mg, Oral, Daily   Ibuprofen  200 MG CAPS Ibuprofen    promethazine -dextromethorphan (PROMETHAZINE -DM) 6.25-15 MG/5ML syrup 5 mLs, Oral, 4 times daily PRN   QUEtiapine  (SEROQUEL ) 25 mg, Oral, Daily at bedtime   tirzepatide  (MOUNJARO ) 15 mg, Subcutaneous, Weekly   tranexamic acid  (LYSTEDA ) 1,300 mg, Oral, 3 times daily, Take during menses for a maximum of five days    Allergies[1]  Past Medical History:  Diagnosis Date   Acute bacterial bronchitis 11/05/2015   Allergy    Anemia    Anxiety    Arthritis    Asthma    childhood asthma   Back pain    Boil    tendency to form boils   Depression    has been treated in past-no present meds   Family history of anesthesia complication 2008   son- age 34 - had  lockjaw after surgery (T&A)   Gestational diabetes mellitus (GDM), antepartum 08/17/2016   Fasting on 2 hour was 107   Headache(784.0)    Healthcare maintenance 02/24/2024   Hyperglycemia    Hypertension    Hypertriglyceridemia    Menorrhagia 10/29/2015   Mixed diabetic hyperlipidemia associated with type 2 diabetes mellitus (HCC) 05/23/2020   PCOS (polycystic ovarian syndrome)    Pneumonia    AS A CHILD   Prediabetes      Past Surgical History:  Procedure Laterality Date   CERVICAL CONIZATION W/BX N/A 10/31/2015   Procedure: CONIZATION CERVIX WITH BIOPSY;  Surgeon: Shanda SHAUNNA Muscat, MD;  Location: [facility];  Service: Gynecology;  Laterality: N/A;   CESAREAN SECTION     x2 previous   CESAREAN SECTION  07/03/2011   Procedure: CESAREAN SECTION;  Surgeon: Nena DELENA App, MD;  Location: [facility];  Service: Gynecology;  Laterality: N/A;   CESAREAN SECTION N/A 06/22/2013   Procedure: CESAREAN SECTION, repeat;  Surgeon: Shanda SHAUNNA Muscat, MD;  Location: [facility];  Service: Obstetrics;  Laterality: N/A;   CESAREAN SECTION WITH BILATERAL TUBAL LIGATION Bilateral 02/14/2017   Procedure: CESAREAN SECTION WITH BILATERAL TUBAL LIGATION;  Surgeon: Herchel Gloris DELENA, MD;  [facility];  Service: Obstetrics;  Laterality: Bilateral;   IUD REMOVAL N/A 10/31/2015   Procedure: INTRAUTERINE DEVICE (IUD) REMOVAL;  Surgeon: Shanda SHAUNNA Muscat, MD;  Location: [facility];  Service: Gynecology;  Laterality: N/A;   MOUTH  SURGERY  2004    Family History  Problem Relation Age of Onset   Diabetes Mother    Hypertension Mother    Hyperlipidemia Mother    Arthritis Mother    Anxiety disorder Mother    Depression Mother    Lung cancer Mother        smoked   Hypertension Father    Anxiety disorder Father    Hypertension Paternal Grandmother    Hyperlipidemia Paternal Grandmother    Anesthesia problems Son        son-had T&A-2008-had trismus   Colon cancer Neg Hx    Colon polyps Neg Hx     Esophageal cancer Neg Hx    Rectal cancer Neg Hx    Stomach cancer Neg Hx     Social History   Occupational History   Occupation: Customer Care Rep  Tobacco Use   Smoking status: Former    Current packs/day: 0.10    Average packs/day: 0.1 packs/day for 28.0 years (2.8 ttl pk-yrs)    Types: Cigarettes    Passive exposure: Past   Smokeless tobacco: Never   Tobacco comments:    1 pk per week  Vaping Use   Vaping status: Never Used  Substance and Sexual Activity   Alcohol use: Yes    Comment: occasional    Drug use: No   Sexual activity: Not Currently    Birth control/protection: None     ROS   Objective:   Vitals: BP 127/85 (BP Location: Right Arm)   Pulse 96   Temp 98.4 F (36.9 C) (Oral)   Resp 20   SpO2 96%   Physical Exam Constitutional:      General: She is not in acute distress.    Appearance: Normal appearance. She is well-developed and normal weight. She is not ill-appearing, toxic-appearing or diaphoretic.  HENT:     Head: Normocephalic and atraumatic.     Right Ear: Tympanic membrane, ear canal and external ear normal. No drainage or tenderness. No middle ear effusion. There is no impacted cerumen. Tympanic membrane is not erythematous or bulging.     Left Ear: Tympanic membrane, ear canal and external ear normal. No drainage or tenderness.  No middle ear effusion. There is no impacted cerumen. Tympanic membrane is not erythematous or bulging.     Nose: Congestion present. No rhinorrhea.     Mouth/Throat:     Mouth: Mucous membranes are moist. No oral lesions.     Pharynx: No pharyngeal swelling, oropharyngeal exudate, posterior oropharyngeal erythema or uvula swelling.     Tonsils: No tonsillar exudate or tonsillar abscesses.     Comments: Cobblestone pattern postnasal drainage overlying pharynx. Eyes:     General: No scleral icterus.       Right eye: No discharge.        Left eye: No discharge.     Extraocular Movements: Extraocular movements  intact.     Right eye: Normal extraocular motion.     Left eye: Normal extraocular motion.     Conjunctiva/sclera: Conjunctivae normal.  Cardiovascular:     Rate and Rhythm: Normal rate and regular rhythm.     Heart sounds: Normal heart sounds. No murmur heard.    No friction rub. No gallop.  Pulmonary:     Effort: Pulmonary effort is normal. No respiratory distress.     Breath sounds: No stridor. No wheezing, rhonchi or rales.  Chest:     Chest wall: No tenderness.  Musculoskeletal:  Cervical back: Normal range of motion and neck supple.  Lymphadenopathy:     Cervical: No cervical adenopathy.  Skin:    General: Skin is warm and dry.  Neurological:     General: No focal deficit present.     Mental Status: She is alert and oriented to person, place, and time.  Psychiatric:        Mood and Affect: Mood normal.        Behavior: Behavior normal.     Assessment and Plan :   PDMP not reviewed this encounter.  1. Sinobronchitis   2. Mild persistent asthma with (acute) exacerbation   3. Type 2 diabetes mellitus treated without insulin  (HCC)      Will manage for sinobronchitis with Augmentin , prednisone  especially in the context of her asthma and general supportive care.  Will defer imaging for now.  Counseled patient on potential for adverse effects with medications prescribed/recommended today, ER and return-to-clinic precautions discussed, patient verbalized understanding.     [1]  Allergies Allergen Reactions   Latex Rash and Itching   Lisinopril  Cough   Metformin  And Related Diarrhea     Christopher Savannah, PA-C 08/23/24 1227  "

## 2024-08-23 NOTE — Discharge Instructions (Signed)
 Start amoxicillin -clavulanate, an antibiotic, and prednisone , a steroid, to help with your sinobronchitis infection. Keep using albuterol  as needed. Use cough syrup at bedtime.

## 2024-08-23 NOTE — ED Triage Notes (Signed)
 Pt c/o cough, head/chest congestion, unable to taste or smell started yesterday-last week she coughing and having HA with +flu exposure in the home-had a virtual visit dx bronchitis and was given inhaler, abx and cough med-NAD-steady gait

## 2024-09-11 ENCOUNTER — Telehealth: Payer: Self-pay | Admitting: *Deleted

## 2024-09-11 NOTE — Telephone Encounter (Signed)
 Will forward to PCP Team.                 Copied from CRM #[id number]. Topic: Clinical - Medication Question >> Sep 11, 2024 12:42 PM Farrel B wrote: Reason for CRM: pt is wanting to speak with a provider about getting a prescription for Trintellix, attempted to schedule her for an appt, she stated if she could speak with someone about it that would be better she knows it may need PA. Please called pt at [phone] to advise    ----------------------------------------------------------------------- From previous Reason for Contact - Scheduling: Patient/patient representative is calling to schedule an appointment. Refer to attachments for appointment information.

## 2024-09-12 ENCOUNTER — Telehealth: Payer: Self-pay | Admitting: Obstetrics and Gynecology

## 2024-09-12 DIAGNOSIS — N939 Abnormal uterine and vaginal bleeding, unspecified: Secondary | ICD-10-CM

## 2024-09-12 DIAGNOSIS — D219 Benign neoplasm of connective and other soft tissue, unspecified: Secondary | ICD-10-CM | POA: Diagnosis not present

## 2024-09-12 MED ORDER — TRANEXAMIC ACID 650 MG PO TABS: 1300.0000 mg | ORAL_TABLET | Freq: Three times a day (TID) | ORAL | 9 refills | Status: AC

## 2024-09-12 NOTE — Progress Notes (Signed)
 "   GYNECOLOGY VIRTUAL VISIT ENCOUNTER NOTE  Provider location: Center for [facility]   Patient location: Home  I connected with Eleonore Shippee on 09/12/2024 at  2:15 PM EST by MyChart Video Encounter and verified that I am speaking with the correct person using two identifiers.   I discussed the limitations, risks, security and privacy concerns of performing an evaluation and management service virtually and the availability of in person appointments. I also discussed with the patient that there may be a patient responsible charge related to this service. The patient expressed understanding and agreed to proceed.   History:  Emmaclaire Switala is a 49 y.o. [phone] female being evaluated today for AUB follow up. TXA makes it much lighter though still 7 days long. No longer passing clots. Had pelvic US .   Doesn't want the fibroid removed: either sonata, endometrial ablation, UFE. Leaning towards UFE but if needed to, would have hysterectomy.      Past Medical History:  Diagnosis Date   Acute bacterial bronchitis 11/05/2015   Allergy    Anemia    Anxiety    Arthritis    Asthma    childhood asthma   Back pain    Boil    tendency to form boils   Depression    has been treated in past-no present meds   Family history of anesthesia complication 2008   son- age 64 - had lockjaw after surgery (T&A)   Gestational diabetes mellitus (GDM), antepartum 08/17/2016   Fasting on 2 hour was 107   Headache(784.0)    Healthcare maintenance 02/24/2024   Hyperglycemia    Hypertension    Hypertriglyceridemia    Menorrhagia 10/29/2015   Mixed diabetic hyperlipidemia associated with type 2 diabetes mellitus (HCC) 05/23/2020   PCOS (polycystic ovarian syndrome)    Pneumonia    AS A CHILD   Prediabetes    Past Surgical History:  Procedure Laterality Date   CERVICAL CONIZATION W/BX N/A 10/31/2015   Procedure: CONIZATION CERVIX WITH BIOPSY;  Surgeon: Shanda SHAUNNA Muscat, MD;   [facility]: [facility];  Service: Gynecology;  Laterality: N/A;   CESAREAN SECTION     x2 previous   CESAREAN SECTION  07/03/2011   Procedure: CESAREAN SECTION;  Surgeon: Nena DELENA App, MD;  [facility];  Service: Gynecology;  Laterality: N/A;   CESAREAN SECTION N/A 06/22/2013   Procedure: CESAREAN SECTION, repeat;  Surgeon: Shanda SHAUNNA Muscat, MD;  [facility];  Service: Obstetrics;  Laterality: N/A;   CESAREAN SECTION WITH BILATERAL TUBAL LIGATION Bilateral 02/14/2017   Procedure: CESAREAN SECTION WITH BILATERAL TUBAL LIGATION;  Surgeon: Herchel Gloris DELENA, MD;  [facility];  Service: Obstetrics;  Laterality: Bilateral;   IUD REMOVAL N/A 10/31/2015   Procedure: INTRAUTERINE DEVICE (IUD) REMOVAL;  Surgeon: Shanda SHAUNNA Muscat, MD;  Location: [facility];  Service: Gynecology;  Laterality: N/A;   MOUTH SURGERY  2004   The following portions of the patient's history were reviewed and updated as appropriate: allergies, current medications, past family history, past medical history, past social history, past surgical history and problem list.   Health Maintenance:  Normal pap and negative HRHPV on 07/2024.  Normal mammogram on 07/2024.   Review of Systems:  Pertinent items noted in HPI and remainder of comprehensive ROS otherwise negative.  Physical Exam:   General:  Alert, oriented and cooperative. Patient appears to be in no acute distress.  Mental Status: Normal mood and affect. Normal behavior. Normal judgment  and thought content.   Respiratory: Normal respiratory effort, no problems with respiration noted  Rest of physical exam deferred due to type of encounter  Labs and Imaging No results found for this or any previous visit (from the past 2 weeks). No results found.     Assessment and Plan:     1. Abnormal uterine bleeding (AUB) (Primary) 2. Fibroid Reviewed AUB and fibroid management options including sonata, UFE, myomectomy, hysterectomy, etc. Would liek to proceed  with UFE - referral placed.  - Ambulatory referral to Interventional Radiology - tranexamic acid  (LYSTEDA ) 650 MG TABS tablet; Take 2 tablets (1,300 mg total) by mouth 3 (three) times daily. Take during menses for a maximum of five days  Dispense: 30 tablet; Refill: 9       I discussed the assessment and treatment plan with the patient. The patient was provided an opportunity to ask questions and all were answered. The patient agreed with the plan and demonstrated an understanding of the instructions.   The patient was advised to call back or seek an in-person evaluation/go to the ED if the symptoms worsen or if the condition fails to improve as anticipated.  I provided 10 minutes of face-to-face time during this encounter. I also spent 5 minutes dedicated to the care of this patient including pre-visit review of records, post visit ordering of medications and appropriate tests or procedures, coordinating care and documenting this visit encounter.    Carter Quarry, MD Center for [employer], [facility] "

## 2024-09-15 ENCOUNTER — Other Ambulatory Visit: Payer: Self-pay | Admitting: Interventional Radiology

## 2024-09-15 DIAGNOSIS — D259 Leiomyoma of uterus, unspecified: Secondary | ICD-10-CM

## 2024-09-19 NOTE — Telephone Encounter (Signed)
 RTC to patient informed her of need to come in for an appointment .  Patient is scheduled for 8:45 AM.

## 2024-09-19 NOTE — Telephone Encounter (Signed)
 Patient is calling to check on the progress of her getting a prescription for Trintellix.  Will forward to yellow Team.

## 2024-09-19 NOTE — Telephone Encounter (Signed)
 Patient called again, connected her through to [facility] (ofc) who stated she will get medical to speak with her.

## 2024-09-20 ENCOUNTER — Ambulatory Visit: Admitting: Student

## 2024-09-20 ENCOUNTER — Other Ambulatory Visit: Payer: Self-pay

## 2024-09-20 ENCOUNTER — Encounter: Payer: Self-pay | Admitting: Student

## 2024-09-20 VITALS — BP 122/81 | HR 89 | Temp 98.1°F | Ht 66.5 in | Wt 273.6 lb

## 2024-09-20 DIAGNOSIS — I10 Essential (primary) hypertension: Secondary | ICD-10-CM

## 2024-09-20 DIAGNOSIS — Z7985 Long-term (current) use of injectable non-insulin antidiabetic drugs: Secondary | ICD-10-CM | POA: Diagnosis not present

## 2024-09-20 DIAGNOSIS — E1169 Type 2 diabetes mellitus with other specified complication: Secondary | ICD-10-CM

## 2024-09-20 DIAGNOSIS — E785 Hyperlipidemia, unspecified: Secondary | ICD-10-CM

## 2024-09-20 DIAGNOSIS — Z6841 Body Mass Index (BMI) 40.0 and over, adult: Secondary | ICD-10-CM | POA: Diagnosis not present

## 2024-09-20 DIAGNOSIS — F32A Depression, unspecified: Secondary | ICD-10-CM | POA: Diagnosis not present

## 2024-09-20 LAB — POCT GLYCOSYLATED HEMOGLOBIN (HGB A1C): HbA1c, POC (controlled diabetic range): 7.3 % — AB (ref 0.0–7.0)

## 2024-09-20 LAB — GLUCOSE, CAPILLARY: Glucose-Capillary: 150 mg/dL — ABNORMAL HIGH (ref 70–99)

## 2024-09-20 NOTE — Assessment & Plan Note (Addendum)
 BP Readings from Last 3 Encounters:  09/20/24 122/81  08/23/24 127/85  07/26/24 130/77  Blood pressure controlled.  Continue Exforge  1 tablet daily.

## 2024-09-20 NOTE — Assessment & Plan Note (Addendum)
 Wt Readings from Last 3 Encounters:  09/20/24 273 lb 9.6 oz (124.1 kg)  07/26/24 269 lb (122 kg)  07/21/24 267 lb 4.8 oz (121.2 kg)  Reports weight gain even on Mounjaro  max dose; has gained four pounds since 07/26/2024. Reports poor diet and minimal physical activity.  - Continue Mounjaro  15 mg weekly - Referral to dietician

## 2024-09-20 NOTE — Assessment & Plan Note (Addendum)
 Last A1c 6.7 on 06/08/2024; A1c today 7.3; Cannot take metformin  d/t GI side effects; cannot take SGLT2 inhibitors due to Candida vaginitis CBG 150 today, fasting. Reports polyuria and polydipsia.  Is not a candidate for DPP 4 inhibitors she is on GLP-1.  She does report poor diet, minimal physical activity. - Continue Mounjaro  15 mg weekly - Refer to dietitian to help with diet management - If her A1c continues to increase, consider sulfonylureas as the next line medication

## 2024-09-20 NOTE — Progress Notes (Signed)
 "  Established Patient Office Visit  Subjective   Patient ID: Teresa Perez, female    DOB: 08-17-1976  Age: 49 y.o. MRN: [id number]  Chief Complaint  Patient presents with   Follow-up   Diabetes   Depression   Anxiety   Hypertension    [name] is a 49 y.o. female past medical history of hypertension, type 2 diabetes with hyperlipidemia, weight gain, depression, anxiety presents for follow-up on chronic conditions and medication refill.  Review of Systems:  As per assessment and Plan   Objective:     Vitals:   09/20/24 0904  BP: 122/81  Pulse: 89  Temp: 98.1 F (36.7 C)  TempSrc: Oral  SpO2: 100%  Weight: 273 lb 9.6 oz (124.1 kg)  Height: 5' 6.5 (1.689 m)    Physical Exam General: Sitting in chair, no acute distress Cardiovascular: Regular rate Pulmonary: Breathing comfortably Abdomen: Soft, nontender, nondistended MSK: No lower extremity edema bilaterally  Last metabolic panel Lab Results  Component Value Date   GLUCOSE 98 02/23/2024   NA 139 02/23/2024   K 4.0 02/23/2024   CL 105 02/23/2024   CO2 19 (L) 02/23/2024   BUN 8 02/23/2024   CREATININE 0.73 02/23/2024   EGFR 101 02/23/2024   CALCIUM  9.2 02/23/2024   PROT 6.9 04/23/2021   ALBUMIN 3.8 04/23/2021   LABGLOB 3.1 04/23/2021   AGRATIO 1.2 04/23/2021   BILITOT 0.3 04/23/2021   ALKPHOS 87 04/23/2021   AST 10 04/23/2021   ALT 8 04/23/2021   ANIONGAP 7 02/15/2017   Last lipids Lab Results  Component Value Date   CHOL 163 02/23/2024   HDL 47 02/23/2024   LDLCALC 95 02/23/2024   TRIG 115 02/23/2024   CHOLHDL 3.5 02/23/2024   Last hemoglobin A1c Lab Results  Component Value Date   HGBA1C 7.3 (A) 09/20/2024     The 10-year ASCVD risk score (Arnett DK, et al., 2019) is: 6.4%     09/20/2024    9:06 AM 07/21/2024    9:10 AM 10/09/2023    9:14 AM  PHQ9 SCORE ONLY  PHQ-9 Total Score 5 11 1       Data saved with a previous flowsheet row definition       09/20/2024    9:07 AM 07/21/2024     9:11 AM 09/06/2023    3:43 PM 03/16/2023    4:27 PM  GAD 7 : Generalized Anxiety Score  Nervous, Anxious, on Edge 2 1  3  1    Control/stop worrying 2 3  3  3    Worry too much - different things 2 3  2  2    Trouble relaxing 2 2  2  2    Restless 1 1  2  2    Easily annoyed or irritable 0 2  2  1    Afraid - awful might happen 0 1  2  1    Total GAD 7 Score 9 13 16 12   Anxiety Difficulty Somewhat difficult  Extremely difficult Somewhat difficult     Data saved with a previous flowsheet row definition       Assessment & Plan:   Patient discussed with Dr. Francesco Assessment & Plan Type 2 diabetes mellitus with hyperlipidemia (HCC) Last A1c 6.7 on 06/08/2024; A1c today 7.3; Cannot take metformin  d/t GI side effects; cannot take SGLT2 inhibitors due to Candida vaginitis CBG 150 today, fasting. Reports polyuria and polydipsia.  Is not a candidate for DPP 4 inhibitors she is on GLP-1.  She does  report poor diet, minimal physical activity. - Continue Mounjaro  15 mg weekly - Refer to dietitian to help with diet management - If her A1c continues to increase, consider sulfonylureas as the next line medication  Hypertension, unspecified type BP Readings from Last 3 Encounters:  09/20/24 122/81  08/23/24 127/85  07/26/24 130/77  Blood pressure controlled.  Continue Exforge  1 tablet daily.  Depression, unspecified depression type PHQ 9 score 5; GAD 7 score is 9 today. She is currently on Wellbutrin  200 mg daily, Seroquel  at bedtime.  Reports symptoms of anxiety and crying spells. Reports that she just started a new job last week Friday.  States that she has a therapist, sports, virtual visit, Dr. Okey at [facility] health outpatient behavioral health in [place], office visit 01/28/2024 > she was diagnosed with bipolar 2 disorder, she was given Seroquel , was supposed to continue Celexa  and Wellbutrin  however she reports that she has self tapered herself off of Celexa .  Reports that she has [facility] who told her to taper herself off Celexa  however has not seen the psychiatrist after her last visit.  She was requesting medication for Trintellix, however I had a long discussion with her about her underlying symptoms and notified her that she needs to follow-up with her psychiatrist.  -We will send a referral for psychiatrist in [place] area where she can see the psychiatrist face-to-face Morbid obesity with body mass index (BMI) of 40.0 to 44.9 in adult [facility]) Wt Readings from Last 3 Encounters:  09/20/24 273 lb 9.6 oz (124.1 kg)  07/26/24 269 lb (122 kg)  07/21/24 267 lb 4.8 oz (121.2 kg)  Reports weight gain even on Mounjaro  max dose; has gained four pounds since 07/26/2024. Reports poor diet and minimal physical activity.  - Continue Mounjaro  15 mg weekly - Referral to dietician    Problem List Items Addressed This Visit       Cardiovascular and Mediastinum   Hypertension   BP Readings from Last 3 Encounters:  09/20/24 122/81  08/23/24 127/85  07/26/24 130/77  Blood pressure controlled.  Continue Exforge  1 tablet daily.         Endocrine   Type 2 diabetes mellitus with hyperlipidemia (HCC) - Primary   Last A1c 6.7 on 06/08/2024; A1c today 7.3; Cannot take metformin  d/t GI side effects; cannot take SGLT2 inhibitors due to Candida vaginitis CBG 150 today, fasting. Reports polyuria and polydipsia.  Is not a candidate for DPP 4 inhibitors she is on GLP-1.  She does report poor diet, minimal physical activity. - Continue Mounjaro  15 mg weekly - Refer to dietitian to help with diet management - If her A1c continues to increase, consider sulfonylureas as the next line medication       Relevant Orders   POC Hbg A1C (Completed)   Referral to Nutrition and Diabetes Services   Microalbumin / Creatinine Urine Ratio     Other   Depression   PHQ 9 score 5; GAD 7 score is 9 today. She is currently on Wellbutrin  200 mg daily, Seroquel  at bedtime.  Reports  symptoms of anxiety and crying spells. Reports that she just started a new job last week Friday.  States that she has a therapist, sports, virtual visit, Dr. Okey at [facility] outpatient behavioral health in [place], office visit 01/28/2024 > she was diagnosed with bipolar 2 disorder, she was given Seroquel , was supposed to continue Celexa  and Wellbutrin  however she reports that she has self tapered herself off of  Celexa .  Reports that she has been seeing [facility] who told her to taper herself off Celexa  however has not seen the psychiatrist after her last visit.  She was requesting medication for Trintellix, however I had a long discussion with her about her underlying symptoms and notified her that she needs to follow-up with her psychiatrist.  -We will send a referral for psychiatrist in [place] area where she can see the psychiatrist face-to-face      Relevant Orders   Ambulatory referral to Psychiatry   Weight gain   Wt Readings from Last 3 Encounters:  09/20/24 273 lb 9.6 oz (124.1 kg)  07/26/24 269 lb (122 kg)  07/21/24 267 lb 4.8 oz (121.2 kg)  Reports weight gain even on Mounjaro  max dose; has gained four pounds since 07/26/2024. Reports poor diet and minimal physical activity.  - Continue Mounjaro  15 mg weekly - Referral to dietician         Return in about 3 months (around 12/19/2024) for DM.    Toma Edwards, DO "

## 2024-09-20 NOTE — Patient Instructions (Signed)
 Thank you, Ms.Adean Krass for allowing us  to provide your care today. Today we discussed:  I am sending another referral for psychiatrist in [place], please make sure you pick their call to schedule an appointment.  I would like for you to follow up with dietician   I have ordered the following labs for you:   Lab Orders         Glucose, capillary         Microalbumin / Creatinine Urine Ratio         POC Hbg A1C      Tests ordered today:  As above   Referrals ordered today:    Referral Orders         Referral to Nutrition and Diabetes Services         Ambulatory referral to Psychiatry      I have ordered the following medication/changed the following medications:   Stop the following medications: Medications Discontinued During This Encounter  Medication Reason   citalopram  (CELEXA ) 20 MG tablet    predniSONE  (DELTASONE ) 20 MG tablet    promethazine -dextromethorphan (PROMETHAZINE -DM) 6.25-15 MG/5ML syrup      Start the following medications: No orders of the defined types were placed in this encounter.    Follow up: 3 months DM   Remember:   Should you have any questions or concerns please call the internal medicine clinic at [phone].     Dr. T[facility]

## 2024-09-20 NOTE — Assessment & Plan Note (Addendum)
 PHQ 9 score 5; GAD 7 score is 9 today. She is currently on Wellbutrin  200 mg daily, Seroquel  at bedtime.  Reports symptoms of anxiety and crying spells. Reports that she just started a new job last week Friday.  States that she has a therapist, sports, virtual visit, Dr. Okey at [facility] in [place], office visit 01/28/2024 > she was diagnosed with bipolar 2 disorder, she was given Seroquel , was supposed to continue Celexa  and Wellbutrin  however she reports that she has self tapered herself off of Celexa .  Reports that she has been seeing [facility] who told her to taper herself off Celexa  however has not seen the psychiatrist after her last visit.  She was requesting medication for Trintellix, however I had a long discussion with her about her underlying symptoms and notified her that she needs to follow-up with her psychiatrist.  -We will send a referral for psychiatrist in [place] area where she can see the psychiatrist face-to-face

## 2024-09-21 ENCOUNTER — Ambulatory Visit: Payer: Self-pay | Admitting: Student

## 2024-09-21 LAB — MICROALBUMIN / CREATININE URINE RATIO
Creatinine, Urine: 105.4 mg/dL
Microalb/Creat Ratio: 10 mg/g{creat} (ref 0–29)
Microalbumin, Urine: 10.3 ug/mL

## 2024-09-22 NOTE — Progress Notes (Signed)
 Internal Medicine Clinic Attending  Case discussed with the resident at the time of the visit.  We reviewed the resident's history and exam and pertinent patient test results.  I agree with the assessment, diagnosis, and plan of care documented in the resident's note.

## 2024-09-28 ENCOUNTER — Encounter: Payer: Self-pay | Admitting: Interventional Radiology

## 2024-10-02 ENCOUNTER — Other Ambulatory Visit

## 2024-10-04 ENCOUNTER — Other Ambulatory Visit: Payer: Self-pay | Admitting: Student

## 2024-10-04 ENCOUNTER — Telehealth: Payer: Self-pay

## 2024-10-04 DIAGNOSIS — E1169 Type 2 diabetes mellitus with other specified complication: Secondary | ICD-10-CM

## 2024-10-04 NOTE — Telephone Encounter (Signed)
 Pt is overdue for an Eye Exam.  Could a New Referral be placed for a DM Eye Exam?

## 2024-10-10 ENCOUNTER — Ambulatory Visit: Payer: Self-pay | Admitting: Dietician

## 2024-10-11 ENCOUNTER — Other Ambulatory Visit
